# Patient Record
Sex: Female | Born: 1935 | Race: White | Hispanic: No | Marital: Married | Smoking: Never smoker
Health system: Southern US, Community
[De-identification: ages and names within clinical notes are randomized; demographics above are authoritative.]

## PROBLEM LIST (undated history)

## (undated) DIAGNOSIS — E785 Hyperlipidemia, unspecified: Secondary | ICD-10-CM

## (undated) DIAGNOSIS — M419 Scoliosis, unspecified: Secondary | ICD-10-CM

## (undated) DIAGNOSIS — M1990 Unspecified osteoarthritis, unspecified site: Secondary | ICD-10-CM

## (undated) DIAGNOSIS — IMO0001 Reserved for inherently not codable concepts without codable children: Secondary | ICD-10-CM

## (undated) DIAGNOSIS — I10 Essential (primary) hypertension: Secondary | ICD-10-CM

## (undated) DIAGNOSIS — T7840XA Allergy, unspecified, initial encounter: Secondary | ICD-10-CM

## (undated) DIAGNOSIS — Z5189 Encounter for other specified aftercare: Secondary | ICD-10-CM

## (undated) HISTORY — DX: Allergy, unspecified, initial encounter: T78.40XA

## (undated) HISTORY — PX: ABDOMINAL HYSTERECTOMY: SHX81

## (undated) HISTORY — DX: Essential (primary) hypertension: I10

## (undated) HISTORY — PX: BREAST BIOPSY: SHX20

## (undated) HISTORY — DX: Encounter for other specified aftercare: Z51.89

## (undated) HISTORY — DX: Reserved for inherently not codable concepts without codable children: IMO0001

## (undated) HISTORY — PX: APPENDECTOMY: SHX54

## (undated) HISTORY — DX: Unspecified osteoarthritis, unspecified site: M19.90

## (undated) HISTORY — DX: Hyperlipidemia, unspecified: E78.5

## (undated) HISTORY — PX: JOINT REPLACEMENT: SHX530

---

## 2010-05-13 ENCOUNTER — Ambulatory Visit (INDEPENDENT_AMBULATORY_CARE_PROVIDER_SITE_OTHER): Payer: MEDICARE | Admitting: Family Medicine

## 2010-05-13 ENCOUNTER — Encounter: Payer: Self-pay | Admitting: Family Medicine

## 2010-05-13 VITALS — BP 179/88 | HR 86 | Temp 98.9°F | Ht 65.0 in | Wt 158.0 lb

## 2010-05-13 DIAGNOSIS — M949 Disorder of cartilage, unspecified: Secondary | ICD-10-CM

## 2010-05-13 DIAGNOSIS — M8580 Other specified disorders of bone density and structure, unspecified site: Secondary | ICD-10-CM

## 2010-05-13 DIAGNOSIS — E7800 Pure hypercholesterolemia, unspecified: Secondary | ICD-10-CM

## 2010-05-13 DIAGNOSIS — I10 Essential (primary) hypertension: Secondary | ICD-10-CM

## 2010-05-13 DIAGNOSIS — M810 Age-related osteoporosis without current pathological fracture: Secondary | ICD-10-CM | POA: Insufficient documentation

## 2010-05-13 DIAGNOSIS — G2581 Restless legs syndrome: Secondary | ICD-10-CM | POA: Insufficient documentation

## 2010-05-13 DIAGNOSIS — J309 Allergic rhinitis, unspecified: Secondary | ICD-10-CM | POA: Insufficient documentation

## 2010-05-13 NOTE — Progress Notes (Signed)
  Subjective:    Patient ID: Gina Bishop, female    DOB: 10/06/1931, 77 y.o.   MRN: [id number]  HPI Healthy, switching doctors Has labile hypertension HPDP Colonoscopy 2006 normal Mammogram 3/12 normal Full shot 9/11 Pneumovax after age 69 Has had zostavax 2008 Tetanus 2004 Has been told osteopenia.  Last bone density ~5 years ago. Las labs including lipid panel were 06/18/09.   Review of Systems No active complaints.  Denies CP syncope, SOB, unusual wt change or bleeding.    Objective:   Physical Exam HEENT nl Neck supple without thyrometally Lungs clear Cardiac RRR without m or g Abd benign Ext no edema       Assessment & Plan:

## 2010-05-15 MED ORDER — CHLORTHALIDONE 25 MG PO TABS: 25.0000 mg | ORAL_TABLET | ORAL | Status: DC

## 2010-05-15 NOTE — Assessment & Plan Note (Signed)
Seems to be isolated systolic hypertension.  Increase chlortalidone to every other day.

## 2010-05-15 NOTE — Assessment & Plan Note (Signed)
Due for bone density.

## 2010-06-24 ENCOUNTER — Encounter: Payer: Self-pay | Admitting: Family Medicine

## 2010-06-24 ENCOUNTER — Ambulatory Visit (HOSPITAL_COMMUNITY)
Admission: RE | Admit: 2010-06-24 | Discharge: 2010-06-24 | Disposition: A | Discharge status: Home / Self Care | Payer: MEDICARE | Source: Ambulatory Visit | Attending: Family Medicine | Admitting: Family Medicine

## 2010-06-24 ENCOUNTER — Ambulatory Visit (INDEPENDENT_AMBULATORY_CARE_PROVIDER_SITE_OTHER): Payer: MEDICARE | Admitting: Family Medicine

## 2010-06-24 VITALS — BP 150/83 | HR 96 | Temp 98.2°F | Ht 65.0 in | Wt 155.0 lb

## 2010-06-24 DIAGNOSIS — Z780 Asymptomatic menopausal state: Secondary | ICD-10-CM | POA: Insufficient documentation

## 2010-06-24 DIAGNOSIS — I10 Essential (primary) hypertension: Secondary | ICD-10-CM

## 2010-06-24 DIAGNOSIS — M8580 Other specified disorders of bone density and structure, unspecified site: Secondary | ICD-10-CM

## 2010-06-24 DIAGNOSIS — Z13820 Encounter for screening for osteoporosis: Secondary | ICD-10-CM | POA: Insufficient documentation

## 2010-06-24 MED ORDER — ERGOCALCIFEROL 1.25 MG (50000 UT) PO CAPS: ORAL_CAPSULE | ORAL | Status: DC

## 2010-06-24 NOTE — Patient Instructions (Signed)
Continue to take BP regularly.  Call me in one month with results.  I am looking for most readings to be 140/80 or less.   I will call with results of blood.   The Vit D refill is at the pharmacy.

## 2010-06-24 NOTE — Progress Notes (Signed)
  Subjective:    Patient ID: Gina Bishop, female    DOB: 15-Nov-1931, 77 y.o.   MRN: [id number]  HPI Up to date on health maintenance.  BP labile.  Still high too often.  Could not tolerate higher dose of chorthalidone.   This is more of a HBP follow up as compared to annual physical.    Review of Systems     Objective:   Physical Exam Lungs clear Cardiac RRR without Murmur or gallop No edema        Assessment & Plan:

## 2010-06-25 ENCOUNTER — Encounter: Payer: Self-pay | Admitting: Family Medicine

## 2010-06-25 LAB — COMPLETE METABOLIC PANEL WITH GFR
ALT: 15 U/L (ref 0–35)
Albumin: 4.4 g/dL (ref 3.5–5.2)
CO2: 25 mEq/L (ref 19–32)
GFR, Est African American: >60 mL/min (ref >60)
Potassium: 4.4 mEq/L (ref 3.5–5.3)
Sodium: 130 mEq/L — ABNORMAL LOW (ref 135–145)
Total Bilirubin: 0.4 mg/dL (ref 0.3–1.2)
Total Protein: 7 g/dL (ref 6.0–8.3)

## 2010-06-26 NOTE — Assessment & Plan Note (Signed)
Check BMP.  May need added antihypertensive.  Difficult to tell from home record with increasing and decreasing diuretic.  Will monitor home BP x 1 month then decide.

## 2010-07-02 ENCOUNTER — Telehealth: Payer: Self-pay | Admitting: Family Medicine

## 2010-07-02 NOTE — Telephone Encounter (Signed)
Is returning call to Dr Leveda Anna - please call about results

## 2010-07-03 ENCOUNTER — Encounter: Payer: Self-pay | Admitting: Family Medicine

## 2010-07-03 NOTE — Telephone Encounter (Signed)
Mr. Baskins called again regarding phone call made earlier.  Please call him back at [phone]

## 2010-07-06 NOTE — Telephone Encounter (Signed)
This encounter was created in error - please disregard.

## 2010-07-06 NOTE — Telephone Encounter (Signed)
Dr. Leveda Anna, If you tell me what this patient needs to know I will be more than happy to call her. ---[place] Dec

## 2010-07-07 NOTE — Telephone Encounter (Signed)
Bone density results noted.  Need to compare to old records to see if it has worsened.

## 2010-07-08 ENCOUNTER — Ambulatory Visit (INDEPENDENT_AMBULATORY_CARE_PROVIDER_SITE_OTHER): Payer: 59 | Admitting: Family Medicine

## 2010-07-08 ENCOUNTER — Encounter: Payer: Self-pay | Admitting: Family Medicine

## 2010-07-08 VITALS — BP 164/82 | Temp 98.1°F | Ht 65.0 in | Wt 155.0 lb

## 2010-07-08 DIAGNOSIS — B373 Candidiasis of vulva and vagina: Secondary | ICD-10-CM | POA: Insufficient documentation

## 2010-07-08 DIAGNOSIS — R300 Dysuria: Secondary | ICD-10-CM | POA: Insufficient documentation

## 2010-07-08 DIAGNOSIS — I10 Essential (primary) hypertension: Secondary | ICD-10-CM

## 2010-07-08 LAB — POCT URINALYSIS DIPSTICK
Bilirubin, UA: NEGATIVE
Blood, UA: NEGATIVE
Glucose, UA: NEGATIVE
Spec Grav, UA: 1.01
Urobilinogen, UA: 0.2

## 2010-07-08 LAB — POCT WET PREP (WET MOUNT)

## 2010-07-08 MED ORDER — FLUCONAZOLE 150 MG PO TABS: 150.0000 mg | ORAL_TABLET | Freq: Once | ORAL | Status: AC

## 2010-07-08 NOTE — Assessment & Plan Note (Signed)
Dysuria more suggestive of yeast than cystitis, no evidence of uti on labwork.  WIll treat for candidal vaginitis.

## 2010-07-08 NOTE — Progress Notes (Signed)
  Subjective:    Patient ID: Gina Bishop, female    DOB: 10-May-1931, 77 y.o.   MRN: [id number]  HPI  DYSURIA  Onset: 3 days   Worsening:no    Symptoms Urgency: no  Frequency: yes  Hesitancy: no  Hematuria: no  Flank Pain: no  Fever: no   Highest Temp:   Nausea/Vomiting: no  Pregnant: no STD exposure/history: no Discharge: no Irritants: no Rash: no  Red Flags  : (Risk Factors for Complicated UTI) Recent Antibiotic Usage (last 30 days): yes  Symptoms lasting more than seven (7) days: no  More than 3 UTI's last 12 months: no  PMH of  1. DM: no 2. Renal Disease/Calculi: no 3. Urinary Tract Abnormality: no  4. Instrumentation/Trauma: no 5. Immunosuppression: no    Review of Systems See hPI    Objective:   Physical Exam  Constitutional: She appears well-developed and well-nourished.  Abdominal: Soft. Bowel sounds are normal. She exhibits no distension and no mass. There is no tenderness. There is no rebound and no guarding.  Genitourinary: Vagina normal.       Thick white vaginal cream? noted          Assessment & Plan:

## 2010-07-08 NOTE — Progress Notes (Signed)
Addended by: [place], Jazmyne Beauchesne on: 07/08/2010 10:20 AM   Modules accepted: Orders

## 2010-07-08 NOTE — Assessment & Plan Note (Signed)
History of vaginal burning, recent antibiotic use.  Will Rx fluconazole.

## 2010-07-08 NOTE — Assessment & Plan Note (Signed)
BP elevated today.  Patient states she thinks BP usually in 144-147/73 range.  Did not bring log today. States she will call Dr. Leveda Anna as previously arranged to discuss BP log and need for additional antihypertensive

## 2010-07-23 ENCOUNTER — Telehealth: Payer: Self-pay | Admitting: Family Medicine

## 2010-07-23 NOTE — Telephone Encounter (Signed)
Reports last 2 weeks of BP: 147/84 138/73 142/72 147/77 127/58 154/76 146/74 146/72 150/86 134/79 Feels like she has leveled out -

## 2010-07-24 NOTE — Telephone Encounter (Signed)
Called and LM.  BPs are a bit high, but not so high that I want to change meds.  Recheck in 2 months with another series of home BPs

## 2010-09-07 ENCOUNTER — Encounter: Payer: Self-pay | Admitting: Family Medicine

## 2010-09-07 ENCOUNTER — Ambulatory Visit (INDEPENDENT_AMBULATORY_CARE_PROVIDER_SITE_OTHER): Payer: 59 | Admitting: Family Medicine

## 2010-09-07 DIAGNOSIS — I10 Essential (primary) hypertension: Secondary | ICD-10-CM

## 2010-09-07 DIAGNOSIS — R21 Rash and other nonspecific skin eruption: Secondary | ICD-10-CM | POA: Insufficient documentation

## 2010-09-07 MED ORDER — FLUTICASONE PROPIONATE 50 MCG/ACT NA SUSP: 2.0000 | Freq: Every day | NASAL | Status: DC

## 2010-09-07 MED ORDER — PRAVASTATIN SODIUM 40 MG PO TABS: 40.0000 mg | ORAL_TABLET | Freq: Every day | ORAL | Status: DC

## 2010-09-07 NOTE — Assessment & Plan Note (Signed)
Unclear etiology.  Will stop current creams.  Consider bacterial source given 2 small pustules.  Will wash with mild soap and treat with bacitracin.  To follow up with Dr. Leveda Anna in 2 weeks.  Precautions reviewed.

## 2010-09-07 NOTE — Progress Notes (Signed)
  Subjective:    Patient ID: Carollee Nussbaumer, female    DOB: 09-01-1931, 77 y.o.   MRN: [id number]  HPI 77 yo pt of Dr. Cyndia Skeeters here for outbreak on face.  She reports it started about 5 weeks ago on the L side of her nose with a pimple, which she treated with an "acne" medicine that had hydrocortisone cream in it.  Then noted similar rash on R side of nose.  Potential factors include menthol liquid that she applies to arm and then might get on face while sleeping, also rubs nose a lot in summer.  Rash itches and stings.  Has tried the acne cream, and diphenhydramine gel with some relief, but it said to stop after 2 weeks.   Also noted similar spot under R eye.   Denies fever, chills, oral lesions, chest pain. BP runs 140s/70s at home.  Does not want to discuss blood pressure today.    Review of Systems  See HPI     Objective:   Physical Exam  Nursing note and vitals reviewed. Constitutional: She appears well-developed and well-nourished. No distress.  HENT:  Mouth/Throat: Oropharynx is clear and moist. No oropharyngeal exudate.  Eyes: Conjunctivae are normal. Right eye exhibits no discharge. Left eye exhibits no discharge. No scleral icterus.  Lymphadenopathy:    She has no cervical adenopathy.  Skin: She is not diaphoretic.       + erythema around nares B and under R eye, medial side.  Small pustule on R side of nose, under R eye.  No scale.  Clear borders.          Assessment & Plan:

## 2010-09-07 NOTE — Assessment & Plan Note (Signed)
Poor control today.  Pt without red flags.  She declines intervention today and plans to follow up with Dr. Leveda Bishop in 2 weeks.

## 2010-09-07 NOTE — Patient Instructions (Signed)
You can try washing your face with a mild soap, and then applying bacitracin to the affected areas.  If it gets worse, or you have any vision problems, then please come back and see [place]. Your blood pressure was 190/92 today.  I think it is a good idea to keep checking it at home.

## 2010-09-28 ENCOUNTER — Telehealth: Payer: Self-pay | Admitting: Family Medicine

## 2010-09-28 DIAGNOSIS — I10 Essential (primary) hypertension: Secondary | ICD-10-CM

## 2010-09-28 MED ORDER — BENAZEPRIL HCL 40 MG PO TABS: 40.0000 mg | ORAL_TABLET | Freq: Every day | ORAL | Status: DC

## 2010-09-28 MED ORDER — CHLORTHALIDONE 25 MG PO TABS: 12.5000 mg | ORAL_TABLET | ORAL | Status: DC

## 2010-09-28 NOTE — Assessment & Plan Note (Signed)
Home BPs are acceptable, but not ideal.  Discussed with patient.  Will not change current meds.

## 2010-09-28 NOTE — Telephone Encounter (Signed)
Mrs. Natt called in her bp numbers from last month thru today.  147/75 on 7/7 138/73 on 7/28 137/80 on 7/29 148/77 on 8/3 147/87 on 8/5 128/65 on 8/6 149/73 on 8/7 144/74 on 8/9 119/69 on 8/10 157/78 on 8/11 152/77 on 8/12 146/71 on 8/13   Mrs. Strauch will be out of town the rest of this week and if you need to call her regarding this you can reach her at number given for contact.  Also she is needing her refills on her Benazapril and her Chlortalidone called to [facility] at [place] on [address].  Would like to have before leaving tomorrow.

## 2010-12-11 ENCOUNTER — Encounter: Payer: Self-pay | Admitting: Family Medicine

## 2010-12-11 ENCOUNTER — Ambulatory Visit (INDEPENDENT_AMBULATORY_CARE_PROVIDER_SITE_OTHER): Payer: Medicare Other | Admitting: Family Medicine

## 2010-12-11 VITALS — BP 162/84 | HR 70 | Temp 98.1°F | Ht 65.0 in | Wt 158.3 lb

## 2010-12-11 DIAGNOSIS — M8580 Other specified disorders of bone density and structure, unspecified site: Secondary | ICD-10-CM

## 2010-12-11 DIAGNOSIS — E7800 Pure hypercholesterolemia, unspecified: Secondary | ICD-10-CM

## 2010-12-11 DIAGNOSIS — M949 Disorder of cartilage, unspecified: Secondary | ICD-10-CM

## 2010-12-11 DIAGNOSIS — R21 Rash and other nonspecific skin eruption: Secondary | ICD-10-CM

## 2010-12-11 DIAGNOSIS — Z23 Encounter for immunization: Secondary | ICD-10-CM

## 2010-12-11 DIAGNOSIS — I10 Essential (primary) hypertension: Secondary | ICD-10-CM

## 2010-12-11 LAB — COMPLETE METABOLIC PANEL WITH GFR
Albumin: 4.5 g/dL (ref 3.5–5.2)
CO2: 25 mEq/L (ref 19–32)
Calcium: 9.8 mg/dL (ref 8.4–10.5)
Chloride: 97 mEq/L (ref 96–112)
GFR, Est African American: >90 mL/min (ref >90)
GFR, Est Non African American: 80 mL/min — ABNORMAL LOW (ref >90)
Glucose, Bld: 95 mg/dL (ref 70–99)
Potassium: 4.1 mEq/L (ref 3.5–5.3)
Sodium: 133 mEq/L — ABNORMAL LOW (ref 135–145)
Total Bilirubin: 0.5 mg/dL (ref 0.3–1.2)
Total Protein: 6.9 g/dL (ref 6.0–8.3)

## 2010-12-11 LAB — LIPID PANEL: HDL: 68 mg/dL (ref >39)

## 2010-12-11 MED ORDER — PRAVASTATIN SODIUM 40 MG PO TABS: 40.0000 mg | ORAL_TABLET | Freq: Every day | ORAL | Status: DC

## 2010-12-11 MED ORDER — METRONIDAZOLE 0.75 % EX GEL: Freq: Two times a day (BID) | CUTANEOUS | Status: AC

## 2010-12-11 MED ORDER — METOPROLOL SUCCINATE ER 25 MG PO TB24: 25.0000 mg | ORAL_TABLET | Freq: Every day | ORAL | Status: DC

## 2010-12-11 MED ORDER — CHLORTHALIDONE 25 MG PO TABS: 12.5000 mg | ORAL_TABLET | ORAL | Status: DC

## 2010-12-11 MED ORDER — BENAZEPRIL HCL 40 MG PO TABS: 40.0000 mg | ORAL_TABLET | Freq: Every day | ORAL | Status: DC

## 2010-12-11 MED ORDER — CHLORTHALIDONE 25 MG PO TABS
12.5000 mg | ORAL_TABLET | ORAL | Status: DC
Start: 2010-12-11 — End: 2010-12-11

## 2010-12-11 NOTE — Assessment & Plan Note (Signed)
Stable, cont vit d and calcium

## 2010-12-11 NOTE — Progress Notes (Signed)
  Subjective:    Patient ID: Gina Bishop, female    DOB: Jun 29, 1931, 77 y.o.   MRN: [id number]  HPIHere for follow up of cholesterol and HBP.  Will get wellness visit later Home BPs are marginally high Now taking fish oil On vit d will need recheck CO rash in nasal folds thought due to flonase and has stopped.    Review of Systems  Denies cp, SOB, bleeding, wt change.     Objective:   Physical Exam  Heent Nl except rash in nasolabial folds Neck supple without adenopathy Lungs clear Cardiac RRR  Abd benign Ext trace edema Neuro, gait normal      Assessment & Plan:

## 2010-12-11 NOTE — Assessment & Plan Note (Signed)
Not at goal, add b blocker and check labs

## 2010-12-11 NOTE — Assessment & Plan Note (Signed)
Dx is not certain, will treat as if rosacea

## 2010-12-11 NOTE — Assessment & Plan Note (Signed)
Check labs 

## 2010-12-11 NOTE — Patient Instructions (Signed)
As we discussed Call me in 2-3 weeks if the rash is not getting better Make an appointment with [name] for your annual wellness exam I will call with blood work results

## 2010-12-12 LAB — VITAMIN D 25 HYDROXY (VIT D DEFICIENCY, FRACTURES): Vit D, 25-Hydroxy: 51 ng/mL (ref 30–89)

## 2010-12-14 ENCOUNTER — Other Ambulatory Visit: Payer: Self-pay | Admitting: Family Medicine

## 2010-12-14 ENCOUNTER — Encounter: Payer: Self-pay | Admitting: Family Medicine

## 2010-12-14 MED ORDER — ESTROGENS, CONJUGATED 0.625 MG/GM VA CREA: TOPICAL_CREAM | VAGINAL | Status: DC

## 2010-12-23 ENCOUNTER — Ambulatory Visit (INDEPENDENT_AMBULATORY_CARE_PROVIDER_SITE_OTHER): Payer: Medicare Other | Admitting: Home Health Services

## 2010-12-23 ENCOUNTER — Encounter: Payer: Self-pay | Admitting: Home Health Services

## 2010-12-23 VITALS — BP 151/75 | HR 65 | Temp 98.5°F | Ht 65.0 in | Wt 161.5 lb

## 2010-12-23 DIAGNOSIS — Z0000 Encounter for general adult medical examination without abnormal findings: Secondary | ICD-10-CM

## 2010-12-23 NOTE — Patient Instructions (Signed)
1. Continue to work out at [employer] 4-5 times a week. 2. Continue to eat a variety of foods including 3-5 fruits and vegetables a day. 3. Complete living will and bring copy to Dr. Leveda Anna.

## 2010-12-23 NOTE — Progress Notes (Signed)
Patient here for annual wellness visit, patient reports: Risk Factors/Conditions needing evaluation or treatment: Pt does not have any risk factors that need evaluation. Home Safety: Pt reports living with husband in 2 story home.  Pt reports having smoke detectors and adaptive equipment in bathroom. Other Information: Corrective lens: Pt wears daily corrective lens and visits eye doctor annually. Dentures: Pt does not have dentures and visits dentist 3x a year. Memory: Pt denies memory problems. Patient's Mini Mental Score (recorded in doc. flowsheet): 29  Balance/Gait: Pt does not have any noticeable impairments. Balance Abnormal Patient value  Sitting balance    Sit to stand    Attempts to arise    Immediate standing balance    Standing balance    Nudge    Eyes closed- Romberg    Tandem stance    Back lean    Neck Rotation    360 degree turn    Sitting down     Gait Abnormal Patient value  Initiation of gait    Step length-left    Step length-right    Step height-left    Step height-right    Step symmetry    Step continuity    Path deviation    Trunk movement    Walking stance        Annual Wellness Visit Requirements Recorded Today In  Medical, family, social history Past Medical, Family, Social History Section  Current providers Care team  Current medications Medications  Wt, BP, Ht, BMI Vital signs  Hearing assessment (welcome visit) Hearing/vision  Tobacco, alcohol, illicit drug use History  ADL Nurse Assessment  Depression Screening Nurse Assessment  Cognitive impairment Nurse Assessment  Mini Mental Status Document Flowsheet  Fall Risk Nurse Assessment  Home Safety Progress Note  End of Life Planning (welcome visit) Social Documentation  Medicare preventative services Progress Note  Risk factors/conditions needing evaluation/treatment Progress Note  Personalized health advice Patient Instructions, goals, letter  Diet & Exercise Social Documentation    Emergency Contact Social Documentation  Seat Belts Social Documentation  Sun exposure/protection Social Documentation    Prevention Plan:   Recommended Medicare Prevention Screenings Women over 65 Test For Frequency Date of Last- BOLD if needed  Breast Cancer 1-2 yrs 3/12  Cervical Cancer 1-3 yrs Not indicated  Colorectal Cancer 1-10 yrs 2004  Osteoporosis once Pt reported done  Cholesterol 5 yrs 10/12  Diabetes yearly 10/12  HIV yearly declined  Influenza Shot yearly 10/12  Pneumonia Shot once 10/00  Zostavax Shot once 1/08

## 2010-12-24 ENCOUNTER — Encounter: Payer: Self-pay | Admitting: Home Health Services

## 2010-12-24 NOTE — Progress Notes (Signed)
Subjective:    Patient ID: Gina Bishop, female    DOB: [birth year], 77 y.o.   MRN: [id number]  HPI   Gina, Bishop - 12/23/10 More Detail >>      [name], MPH        Sent: Wed December 23, 2010 12:04 PM    To: Sanjuana Letters          Message     Provider Instructions:  Write addendum:  I have reviewed this visit and discussed with [name] and agree with her documentation.  Click reviewed Medications, Problem List, Past (Medical, Surgical) Family, Social HistoriesCharge   LOS-no charge  Capture charge: [id number]  Close Encounter  Thanks, [name]   12/23/2010 11:00 AM Office Visit  MRN: [id number]   Description: 77 year old female  Provider: [name], MPH  Department: [facility]        Diagnoses     Medicare annual wellness visit, initial   - Primary    V70.0      Reason for Visit     Annual Exam    Medicare Initial Annual Wellness Visit        Vitals - Last Recorded       BP Pulse Temp(Src) Ht Wt BMI    151/75  65  98.5 F (36.9 C) (Oral)  5\' 5"  (1.651 m)  161 lb 8 oz (73.256 kg)  26.88 kg/m2       Vitals History Recorded       Progress Notes     [name], MPH  12/23/2010 12:01 PM  Pended Patient here for annual wellness visit, patient reports: Risk Factors/Conditions needing evaluation or treatment: Pt does not have any risk factors that need evaluation. Home Safety: Pt reports living with husband in 2 story home.  Pt reports having smoke detectors and adaptive equipment in bathroom. Other Information: Corrective lens: Pt wears daily corrective lens and visits eye doctor annually. Dentures: Pt does not have dentures and visits dentist 3x a year. Memory: Pt denies memory problems. Patient's Mini Mental Score (recorded in doc. flowsheet): 29   Balance/Gait: Pt does not have any noticeable impairments. Balance  Abnormal  Patient value   Sitting  balance       Sit to stand       Attempts to arise       Immediate standing balance       Standing balance       Nudge       Eyes closed- Romberg       Tandem stance       Back lean       Neck Rotation       360 degree turn       Sitting down           Gait  Abnormal  Patient value   Initiation of gait       Step length-left       Step length-right       Step height-left       Step height-right       Step symmetry       Step continuity       Path deviation       Trunk movement       Walking stance                 Annual Wellness Visit Requirements  Recorded Today In   Medical, family, social history  Past Medical, Family, Social History Section   Current providers  Care team   Current medications  Medications   Wt, BP, Ht, BMI  Vital signs   Hearing assessment (welcome visit)  Hearing/vision   Tobacco, alcohol, illicit drug use  History   ADL  Nurse Assessment   Depression Screening  Nurse Assessment   Cognitive impairment  Nurse Assessment   Mini Mental Status  Document Flowsheet   Fall Risk  Nurse Assessment   Home Safety  Progress Note   End of Life Planning (welcome visit)  Social Documentation   Medicare preventative services  Progress Note   Risk factors/conditions needing evaluation/treatment  Progress Note   Personalized health advice  Patient Instructions, goals, letter   Diet & Exercise  Social Documentation   Emergency Contact  Social Documentation   Seat Belts  Social Documentation   Sun exposure/protection  Social Documentation        Prevention Plan:     Recommended Medicare Prevention Screenings Women over 65 Test For  Frequency  Date of Last-  BOLD if needed   Breast Cancer  1-2 yrs  3/12   Cervical Cancer  1-3 yrs  Not indicated   Colorectal Cancer  1-10 yrs  2004   Osteoporosis  once  Pt reported done   Cholesterol  5 yrs  10/12   Diabetes  yearly  10/12   HIV  yearly  declined   Influenza Shot  yearly  10/12   Pneumonia  Shot  once  10/00   Zostavax Shot  once  1/08             Electronic signature on 12/23/2010        Not recorded             Patient Instructions     1. Continue to work out at [employer] 4-5 times a week. 2. Continue to eat a variety of foods including 3-5 fruits and vegetables a day. 3. Complete living will and bring copy to Dr. Leveda Anna.          Follow-up and Disposition     Routing History Recorded        All Flowsheet Templates (all recorded)     Mini Mental Exam Flowsheet    Encounter Vitals Flowsheet    Custom Formula Data Flowsheet    Anthropometrics Flowsheet    Extended Vitals Flowsheet    Amb Nursing Assessment Flowsheet                Hearing Screening  Edited by: [name], MPH        125hz  250hz  500hz  1000hz  2000hz  4000hz  8000hz     Right ear     40 40 Fail Fail      Left ear     40 40 Fail Fail      Method: Otoacoustic emissions        Letters     Letter Information         Status    Oneal Grout on 12/23/2010 Sent           Referring Provider     Richrd Prime Hensel        Routing History       Recipient Method User Date Routed To    [facility] [name], [place] [[id number]] 12/23/2010 (none on file)    [address] [place] [address]  Phone: [phone] Letter: created on 12/23/2010 by [name], MPH              Other Encounter Related Information     Allergies & Medications         Problem List         History         Patient-Entered Questionnaires     No data filed            Review of Systems     Objective:   Physical Exam        Assessment & Plan:

## 2011-06-18 ENCOUNTER — Other Ambulatory Visit: Payer: Self-pay | Admitting: Family Medicine

## 2011-06-18 DIAGNOSIS — G2581 Restless legs syndrome: Secondary | ICD-10-CM

## 2011-06-18 MED ORDER — PRAMIPEXOLE DIHYDROCHLORIDE 0.5 MG PO TABS: 0.5000 mg | ORAL_TABLET | Freq: Every evening | ORAL | Status: DC

## 2011-06-18 NOTE — Assessment & Plan Note (Signed)
Med refill per fax request

## 2011-09-27 ENCOUNTER — Other Ambulatory Visit: Payer: Self-pay | Admitting: Family Medicine

## 2011-09-28 ENCOUNTER — Telehealth: Payer: Self-pay | Admitting: Family Medicine

## 2011-09-28 NOTE — Telephone Encounter (Signed)
Spoke with patient and she is coming in Friday 8/16 to see dr. Leveda Anna

## 2011-09-28 NOTE — Telephone Encounter (Signed)
Patient is calling to speak to Dr. Cyndia Skeeters nurse about the stiffness she has in her body.  Ibuprofen helps but she doesn't want to have to take it continuously.  Also, she isn't sleeping well at all.

## 2011-10-01 ENCOUNTER — Ambulatory Visit (INDEPENDENT_AMBULATORY_CARE_PROVIDER_SITE_OTHER): Payer: Medicare Other | Admitting: Family Medicine

## 2011-10-01 ENCOUNTER — Encounter: Payer: Self-pay | Admitting: Family Medicine

## 2011-10-01 VITALS — BP 176/74 | HR 102 | Ht 65.0 in | Wt 161.0 lb

## 2011-10-01 DIAGNOSIS — I10 Essential (primary) hypertension: Secondary | ICD-10-CM

## 2011-10-01 DIAGNOSIS — M791 Myalgia, unspecified site: Secondary | ICD-10-CM

## 2011-10-01 DIAGNOSIS — IMO0001 Reserved for inherently not codable concepts without codable children: Secondary | ICD-10-CM

## 2011-10-01 LAB — CBC
HCT: 36.5 % (ref 36.0–46.0)
MCH: 29.4 pg (ref 26.0–34.0)
MCV: 85.9 fL (ref 78.0–100.0)
Platelets: 274 10*3/uL (ref 150–400)
RDW: 13 % (ref 11.5–15.5)

## 2011-10-01 LAB — COMPLETE METABOLIC PANEL WITH GFR
ALT: 15 U/L (ref 0–35)
AST: 21 U/L (ref 0–37)
Albumin: 4.3 g/dL (ref 3.5–5.2)
Alkaline Phosphatase: 87 U/L (ref 39–117)
Calcium: 9.3 mg/dL (ref 8.4–10.5)
Chloride: 98 mEq/L (ref 96–112)
Potassium: 4.6 mEq/L (ref 3.5–5.3)
Sodium: 134 mEq/L — ABNORMAL LOW (ref 135–145)

## 2011-10-01 LAB — CK: Total CK: 59 U/L (ref 7–177)

## 2011-10-01 NOTE — Assessment & Plan Note (Addendum)
Poorly controled.  Will need treatment, but I will wait until labs return so that I don't confound her clinical picture with a new med.  Given results.  Told to see me in 2-3 weeks.  Keep BP log and bring in with visit.

## 2011-10-01 NOTE — Assessment & Plan Note (Signed)
I believe the new onset in a person who rarely complains is worth screening lab workup

## 2011-10-01 NOTE — Patient Instructions (Addendum)
I will call Monday with results. Please keep an eye on your symptoms.  If it is getting worse or new symptoms develop, let me know.   When you are feeling better, I want to increase your blood pressure meds.

## 2011-10-01 NOTE — Progress Notes (Signed)
  Subjective:    Patient ID: Gina Bishop, female    DOB: 1931/04/02, 78 y.o.   MRN: [id number]  HPI  Gina Bishop has two weeks of diffuse pain which she feels is more in the muscles than in the joints.  This seems like a new problem with abrupt onset (as opposed to the gradual worsening of an osteoarthritis.)  No new diet, drugs or activities.  No fever.  No infectious contacts.       Review of Systems     Objective:   Physical ExamNo skin rashes or joint deformaties. Thyroid normal Diffuse muscle tenderness No edema Lungs clear Cardiac RRR without m or g  Abd benign.        Assessment & Plan:

## 2011-10-04 ENCOUNTER — Encounter: Payer: Self-pay | Admitting: Family Medicine

## 2011-10-20 ENCOUNTER — Ambulatory Visit (INDEPENDENT_AMBULATORY_CARE_PROVIDER_SITE_OTHER): Payer: Medicare Other | Admitting: Family Medicine

## 2011-10-20 ENCOUNTER — Ambulatory Visit
Admission: RE | Admit: 2011-10-20 | Discharge: 2011-10-20 | Disposition: A | Discharge status: Home / Self Care | Payer: Medicare Other | Source: Ambulatory Visit | Attending: Family Medicine | Admitting: Family Medicine

## 2011-10-20 ENCOUNTER — Encounter: Payer: Self-pay | Admitting: Family Medicine

## 2011-10-20 VITALS — BP 176/86 | Temp 99.3°F | Ht 65.0 in | Wt 162.0 lb

## 2011-10-20 DIAGNOSIS — M47812 Spondylosis without myelopathy or radiculopathy, cervical region: Secondary | ICD-10-CM | POA: Insufficient documentation

## 2011-10-20 DIAGNOSIS — I10 Essential (primary) hypertension: Secondary | ICD-10-CM

## 2011-10-20 DIAGNOSIS — M542 Cervicalgia: Secondary | ICD-10-CM

## 2011-10-20 DIAGNOSIS — M25519 Pain in unspecified shoulder: Secondary | ICD-10-CM

## 2011-10-20 MED ORDER — CYCLOBENZAPRINE HCL 10 MG PO TABS: 10.0000 mg | ORAL_TABLET | Freq: Every day | ORAL | Status: AC

## 2011-10-20 MED ORDER — AMLODIPINE BESYLATE 5 MG PO TABS: 5.0000 mg | ORAL_TABLET | Freq: Every day | ORAL | Status: DC

## 2011-10-20 NOTE — Patient Instructions (Addendum)
Try being off pravachol for 2 weeks then restarting. I will call with X ray results I sent a muscle relaxer and a new blood pressure pill to your pharmacy.

## 2011-10-21 MED ORDER — MELOXICAM 7.5 MG PO TABS: 7.5000 mg | ORAL_TABLET | Freq: Every day | ORAL | Status: DC

## 2011-10-21 NOTE — Assessment & Plan Note (Signed)
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  May have a mild element of rotator cuff syndrome.

## 2011-10-21 NOTE — Assessment & Plan Note (Signed)
Likely her c spine disease explains all her symptoms.

## 2011-10-21 NOTE — Assessment & Plan Note (Signed)
Poor control, add amlodipine.

## 2011-10-21 NOTE — Progress Notes (Signed)
  Subjective:    Patient ID: Gina Bishop, female    DOB: 13-Oct-1931, 78 y.o.   MRN: [id number]  HPI Patient with two issues: HBP.  Still poor control Bilateral neck, shoulder and arm pain.  States that it is abrupt onset about 3 weeks ago.  Seems worse in shoulders.      Review of Systems     Objective:   Physical ExamNote elevated BP Neck shows limited ROM Both shoulders show crepitus with abduction.        Assessment & Plan:

## 2011-11-17 ENCOUNTER — Telehealth: Payer: Self-pay | Admitting: Family Medicine

## 2011-11-17 NOTE — Telephone Encounter (Signed)
Mr. Gina Bishop called regarding Mrs. Gina Bishop.  Need to speak with you asap.  Will be awaiting your call.  Would like to here from you today.

## 2011-11-17 NOTE — Telephone Encounter (Signed)
To make a long story short, meds for neck not helping much.  Had MRI of neck at [facility].  Now they want to do brain MRI.  No red flag symptoms to no emergency.  They have requesting disc of MRI and will come to me for second opinion.  I will likely have radiologist review MRI.  Also, may need gabapentin since Mobic not working.

## 2011-11-22 ENCOUNTER — Ambulatory Visit (INDEPENDENT_AMBULATORY_CARE_PROVIDER_SITE_OTHER): Payer: Medicare Other | Admitting: Family Medicine

## 2011-11-22 ENCOUNTER — Encounter: Payer: Self-pay | Admitting: Family Medicine

## 2011-11-22 VITALS — BP 161/71 | HR 98 | Temp 98.7°F | Wt 163.4 lb

## 2011-11-22 DIAGNOSIS — M47812 Spondylosis without myelopathy or radiculopathy, cervical region: Secondary | ICD-10-CM

## 2011-11-22 DIAGNOSIS — E7800 Pure hypercholesterolemia, unspecified: Secondary | ICD-10-CM

## 2011-11-22 DIAGNOSIS — I10 Essential (primary) hypertension: Secondary | ICD-10-CM

## 2011-11-22 NOTE — Patient Instructions (Signed)
Reasons for neck surgery: 1. Any pressure on the spinal cord requires immediate neck surg.  You don't show any signs of pressure on the cord, but the space is narrowed.  You'll need to have this followed. 2. If we cannot treat the pain, you would need surgery to relieve the pain.  There are plenty of meds that you can try - let your neurologist friend give you a starting point. 3. Weakness is a sign that you are risking permanent nerve damage.  Since your arms are weak, I worry that you will need surgery sooner rather than later.  Talk this over with your neurologist frient.

## 2011-11-22 NOTE — Assessment & Plan Note (Signed)
Long list of home BPs now acceptable.  Up today but she is upset and in pain.  She has good control based on home BPs

## 2011-11-22 NOTE — Addendum Note (Signed)
Addended by: Tivis Ringer on: 11/22/2011 03:42 PM   Modules accepted: Orders

## 2011-11-22 NOTE — Progress Notes (Signed)
  Subjective:    Patient ID: Gina Bishop, female    DOB: 22-Jan-1932, 78 y.o.   MRN: [id number]  HPITwo issues.  Home BPs now much improved with meds.  No longer having hot flashes. Neck pain growing worse.  Had MRI done at [facility].  They bring results and disc.   Results show: 1. Multi level neural foramenal disease due to facet arthropathy and degenerative disc disease. 2. Loss of spinal canal space due to post disc bulge and osteophytes.  Spinal cord signal normal No leg symptoms, does have arm weakness.    Review of Systems     Objective:   Physical Exam  Difficulty lifting arms past 90 degrees.  Partitially due to pain and likely some due to weakness        Assessment & Plan:

## 2011-12-03 ENCOUNTER — Other Ambulatory Visit: Payer: Self-pay | Admitting: Family Medicine

## 2011-12-13 ENCOUNTER — Other Ambulatory Visit: Payer: Self-pay | Admitting: Family Medicine

## 2011-12-17 ENCOUNTER — Other Ambulatory Visit: Payer: Medicare Other

## 2011-12-20 ENCOUNTER — Other Ambulatory Visit: Payer: Medicare Other

## 2011-12-20 DIAGNOSIS — E7800 Pure hypercholesterolemia, unspecified: Secondary | ICD-10-CM

## 2011-12-20 LAB — LIPID PANEL
LDL Cholesterol: 124 mg/dL — ABNORMAL HIGH (ref 0–99)
VLDL: 22 mg/dL (ref 0–40)

## 2011-12-20 NOTE — Progress Notes (Signed)
FLP DONE TODAY Gina Bishop 

## 2011-12-21 ENCOUNTER — Encounter: Payer: Self-pay | Admitting: Family Medicine

## 2011-12-22 ENCOUNTER — Encounter: Payer: Self-pay | Admitting: Family Medicine

## 2011-12-22 ENCOUNTER — Ambulatory Visit (INDEPENDENT_AMBULATORY_CARE_PROVIDER_SITE_OTHER): Payer: Medicare Other | Admitting: Family Medicine

## 2011-12-22 VITALS — BP 138/72 | HR 68 | Temp 98.8°F | Ht 65.0 in | Wt 162.9 lb

## 2011-12-22 DIAGNOSIS — I10 Essential (primary) hypertension: Secondary | ICD-10-CM

## 2011-12-22 DIAGNOSIS — Z23 Encounter for immunization: Secondary | ICD-10-CM

## 2011-12-22 DIAGNOSIS — M47812 Spondylosis without myelopathy or radiculopathy, cervical region: Secondary | ICD-10-CM

## 2011-12-22 MED ORDER — CHLORTHALIDONE 25 MG PO TABS
12.5000 mg | ORAL_TABLET | ORAL | Status: DC
Start: 2011-12-22 — End: 2013-01-29

## 2011-12-22 MED ORDER — TRAMADOL HCL 50 MG PO TABS: 50.0000 mg | ORAL_TABLET | Freq: Three times a day (TID) | ORAL | Status: DC | PRN

## 2011-12-22 MED ORDER — GABAPENTIN 100 MG PO CAPS: 100.0000 mg | ORAL_CAPSULE | Freq: Three times a day (TID) | ORAL | Status: DC

## 2011-12-22 MED ORDER — BENAZEPRIL HCL 40 MG PO TABS: 40.0000 mg | ORAL_TABLET | Freq: Every day | ORAL | Status: DC

## 2011-12-22 MED ORDER — DICYCLOMINE HCL 10 MG PO CAPS: 10.0000 mg | ORAL_CAPSULE | Freq: Three times a day (TID) | ORAL | Status: DC

## 2011-12-22 NOTE — Patient Instructions (Addendum)
I refilled your meds requested. Remember the exercises to prevent frozen shoulder. Stop the meloxicam Start tramadol first - it replaces the meloxicam as a regular pain med After you have been on the tramadol for 1 week, start the gabapentin, which is a nerve pain med. You are on a low dose of the gabapentin - so I can increase it if it is not doing the job.

## 2011-12-27 ENCOUNTER — Encounter: Payer: Self-pay | Admitting: Home Health Services

## 2011-12-28 NOTE — Assessment & Plan Note (Signed)
Given age, I would prefer tramadol and neuropathic pain med over meloxicam/NSAID

## 2011-12-28 NOTE — Progress Notes (Signed)
  Subjective:    Patient ID: Gina Bishop, female    DOB: [birth year], 78 y.o.   MRN: [id number]  HPI Patient complains of neck and bilateral shoulder pain Lt>Rt.  Known significant cervical radiculopathy.  Did get neuro consult and epidural steroid injections.  Unfortunately only got a couple of weeks of relief.  Complains of shooting pain and intermitant numbness.  No leg, bowel or bladder symptoms.    Review of Systems     Objective:   Physical Exam Pulses, reflexes, motor and sensory intact to both upper extremities       Assessment & Plan:

## 2012-01-19 ENCOUNTER — Other Ambulatory Visit: Payer: Self-pay | Admitting: Family Medicine

## 2012-01-19 DIAGNOSIS — M47812 Spondylosis without myelopathy or radiculopathy, cervical region: Secondary | ICD-10-CM

## 2012-01-19 NOTE — Telephone Encounter (Signed)
Forwarded to pcp.Gina Bishop Lynetta  

## 2012-01-19 NOTE — Telephone Encounter (Signed)
Patient is calling for a refill on Gabapentin.  The current dose of 100mg  3xday is not working, so she would like to increase the dose.  She uses [facility].

## 2012-01-20 MED ORDER — GABAPENTIN 300 MG PO CAPS: 300.0000 mg | ORAL_CAPSULE | Freq: Three times a day (TID) | ORAL | Status: DC

## 2012-01-20 NOTE — Telephone Encounter (Signed)
Called and left message.

## 2012-01-20 NOTE — Assessment & Plan Note (Signed)
Will increase dose of gabapentin

## 2012-01-25 ENCOUNTER — Ambulatory Visit (INDEPENDENT_AMBULATORY_CARE_PROVIDER_SITE_OTHER): Payer: Medicare Other | Admitting: Family Medicine

## 2012-01-25 ENCOUNTER — Encounter: Payer: Self-pay | Admitting: Family Medicine

## 2012-01-25 VITALS — BP 160/68 | HR 64 | Temp 98.5°F | Ht 65.0 in | Wt 164.5 lb

## 2012-01-25 DIAGNOSIS — L03114 Cellulitis of left upper limb: Secondary | ICD-10-CM

## 2012-01-25 DIAGNOSIS — L03119 Cellulitis of unspecified part of limb: Secondary | ICD-10-CM

## 2012-01-25 MED ORDER — DOXYCYCLINE HYCLATE 100 MG PO TABS: 100.0000 mg | ORAL_TABLET | Freq: Two times a day (BID) | ORAL | Status: DC

## 2012-01-25 NOTE — Patient Instructions (Addendum)
Take the Doxycycline twice daily for the next 7 days.  Come back in the next 2 days so we can take a look at your hand and make sure it is getting okay.

## 2012-01-26 DIAGNOSIS — L03114 Cellulitis of left upper limb: Secondary | ICD-10-CM | POA: Insufficient documentation

## 2012-01-26 NOTE — Assessment & Plan Note (Signed)
Likely entry point is abrasion between pointer and middle finger on dorsal aspect of hand.  No areas of fluctuance, no signs of abscess.  No systemic symptoms.  Plan to treat with Doxycycline bid x 7 days. Could likely get by with Keflex but want to cover for MRSA.   No history of gout, no real pain or tenderness, making this much less likely. FU on Friday to ensure continued improvement.  Return sooner if any worsening.

## 2012-01-26 NOTE — Progress Notes (Signed)
  Subjective:    Patient ID: Gina Bishop, female    DOB: [birth year], 78 y.o.   MRN: [id number]  HPI  1.  Left hand swelling:  Present x 3 days, started on Saturday.  She noted some increased "tightness" in her hand at that time, but noticeable edema did not start until Sunday.  Some redness dorsal knuckles of pointer and middle fingers, redness has spread across other joints since then.  Denies any pain unless she squeezes deeply in webbing between thumb and first finger, otherwise no pain even with movement of hands/fingers.  Has noted rubor present since Sunday. No history of arthralgias or gout.  No fevers or chills.  Eating and drinking well without N/V.   Review of Systems See HPI above for review of systems.       Objective:   Physical Exam  Gen:  Alert, cooperative patient who appears stated age in no acute distress.  Vital signs reviewed. Ext:   - Right hand:  WNL  - Left hand with moderate edema throughout, extending to wrist.  Full active and passive range of motion of wrist plantar/dorsal and abduction/adduction.  No TTP except deep palpation of superior aspect of thenar eminence (webbing between 1st and 2nd digit).  Radial/ulnar pulse +2.  Sensation +2 and symmetric in all 5 digits as well as rest of hand.   Skin:  Erythema noted across all four MCP joints dorsal aspect of hand, not thumb.  I note an abrasion located between 2nd and 3rd MCP joints dorsal aspect of hand.  There is also a healing scratch on dorsal aspect of hand between thumb and pointer finger.        Assessment & Plan:

## 2012-01-27 ENCOUNTER — Encounter: Payer: Self-pay | Admitting: Family Medicine

## 2012-01-27 ENCOUNTER — Ambulatory Visit (INDEPENDENT_AMBULATORY_CARE_PROVIDER_SITE_OTHER): Payer: Medicare Other | Admitting: Family Medicine

## 2012-01-27 VITALS — BP 168/65 | HR 68 | Temp 97.6°F | Ht 65.6 in | Wt 167.5 lb

## 2012-01-27 DIAGNOSIS — L03119 Cellulitis of unspecified part of limb: Secondary | ICD-10-CM

## 2012-01-27 DIAGNOSIS — L03114 Cellulitis of left upper limb: Secondary | ICD-10-CM

## 2012-01-27 DIAGNOSIS — L02519 Cutaneous abscess of unspecified hand: Secondary | ICD-10-CM

## 2012-01-27 NOTE — Progress Notes (Signed)
  Subjective:    Patient ID: Gina Bishop, female    DOB: 06-12-1931, 78 y.o.   MRN: [id number]  HPI  1.  FU cellulitis of Left hand:  Much improved since initiation of antibiotics.  Heat has resolved, swelling greatly resolved.  Not having any pain.  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  No fevers or chills, eating and drinking well.    Review of Systems See HPI above for review of systems.      Objective:   Physical Exam Gen:  Alert, cooperative patient who appears stated age in no acute distress.  Vital signs reviewed. Ext:    - Left hand:  Much improved exam.  Minimal to no edema.  No erythema.  No warmth.  Full strength handgrip without pain.  No tenderness to palpation throughout.  Healing abrasion between 2nd and 3rd digit knuckles.         Assessment & Plan:

## 2012-01-27 NOTE — Assessment & Plan Note (Signed)
Resolving To finish course of doxycycline. Call or return if worsening, no improvement, or concerns.

## 2012-08-02 ENCOUNTER — Other Ambulatory Visit: Payer: Self-pay | Admitting: Family Medicine

## 2012-08-08 ENCOUNTER — Other Ambulatory Visit: Payer: Self-pay | Admitting: Family Medicine

## 2012-08-08 MED ORDER — PRAMIPEXOLE DIHYDROCHLORIDE 0.5 MG PO TABS: 0.5000 mg | ORAL_TABLET | Freq: Every evening | ORAL | Status: DC | PRN

## 2012-08-16 ENCOUNTER — Other Ambulatory Visit: Payer: Self-pay | Admitting: Family Medicine

## 2012-08-31 ENCOUNTER — Other Ambulatory Visit: Payer: Self-pay | Admitting: Family Medicine

## 2012-10-14 ENCOUNTER — Other Ambulatory Visit: Payer: Self-pay | Admitting: Family Medicine

## 2012-11-17 ENCOUNTER — Encounter: Payer: Self-pay | Admitting: Family Medicine

## 2012-11-17 ENCOUNTER — Ambulatory Visit (INDEPENDENT_AMBULATORY_CARE_PROVIDER_SITE_OTHER): Payer: Medicare Other | Admitting: Family Medicine

## 2012-11-17 VITALS — BP 160/75 | HR 62 | Temp 98.7°F | Ht 65.0 in | Wt 160.0 lb

## 2012-11-17 DIAGNOSIS — E559 Vitamin D deficiency, unspecified: Secondary | ICD-10-CM

## 2012-11-17 DIAGNOSIS — IMO0001 Reserved for inherently not codable concepts without codable children: Secondary | ICD-10-CM

## 2012-11-17 DIAGNOSIS — M791 Myalgia, unspecified site: Secondary | ICD-10-CM

## 2012-11-17 DIAGNOSIS — M8580 Other specified disorders of bone density and structure, unspecified site: Secondary | ICD-10-CM

## 2012-11-17 DIAGNOSIS — Z23 Encounter for immunization: Secondary | ICD-10-CM

## 2012-11-17 DIAGNOSIS — I10 Essential (primary) hypertension: Secondary | ICD-10-CM

## 2012-11-17 DIAGNOSIS — M899 Disorder of bone, unspecified: Secondary | ICD-10-CM

## 2012-11-17 DIAGNOSIS — M47812 Spondylosis without myelopathy or radiculopathy, cervical region: Secondary | ICD-10-CM

## 2012-11-17 LAB — BASIC METABOLIC PANEL
Calcium: 9.8 mg/dL (ref 8.4–10.5)
Glucose, Bld: 86 mg/dL (ref 70–99)
Sodium: 136 mEq/L (ref 135–145)

## 2012-11-17 MED ORDER — BENAZEPRIL HCL 40 MG PO TABS: 40.0000 mg | ORAL_TABLET | Freq: Every day | ORAL | Status: DC

## 2012-11-17 MED ORDER — METOPROLOL SUCCINATE ER 50 MG PO TB24: 50.0000 mg | ORAL_TABLET | Freq: Every day | ORAL | Status: DC

## 2012-11-17 MED ORDER — MELOXICAM 7.5 MG PO TABS: 7.5000 mg | ORAL_TABLET | Freq: Every day | ORAL | Status: DC

## 2012-11-17 NOTE — Assessment & Plan Note (Signed)
Has been taking vit d.  Last level 2 years ago.  Will recheck to avoid tox

## 2012-11-17 NOTE — Assessment & Plan Note (Signed)
Check bone density. 

## 2012-11-17 NOTE — Patient Instructions (Addendum)
Google "isolated systolic hypertension"  Which is what you have.  Our goal for you is no longer 140/90 or less Our goal for you is now 150/90 or less. I am going to increase your metoprolol to 50 mg daily.  You can use up your current supply by just taking two pills daily I would like to see you in 1-2 months - check your BP at home - to see if we're at goal. I'll call with blood work results

## 2012-11-17 NOTE — Assessment & Plan Note (Signed)
Does have leg cramps.  Unwilling to increase to high potency statin.

## 2012-11-17 NOTE — Addendum Note (Signed)
Addended by: Jimmy Footman K on: 11/17/2012 12:18 PM   Modules accepted: Orders

## 2012-11-17 NOTE — Progress Notes (Signed)
  Subjective:    Patient ID: Gina Bishop, female    DOB: Mar 10, 1931, 79 y.o.   MRN: [id number]  HPI   Multiple issues.  1. Needs flu, prevnar and tetanus.  Agrees to flu and prevnar today. 2. BP elevated.  She has systolic HBP.  Goal is <150/90 3. Known osteopenia, close to osteoporosis.  Will recheck bone density.   4. Knee pain.  Tramadol did not help.  Back on mobic.  Wants to continue.  No apparent side effects. 5. Wants refill on vit D.  May not need.  Last level 2 years ago well within normal range.     Review of Systems     Objective:   Physical Exam  BP confirmed Left knee, no effusion.  + crepitus with ROM       Assessment & Plan:

## 2012-11-17 NOTE — Assessment & Plan Note (Signed)
Increase metoprolol.  Goal<150 systolic

## 2012-11-18 LAB — VITAMIN D 25 HYDROXY (VIT D DEFICIENCY, FRACTURES): Vit D, 25-Hydroxy: 63 ng/mL (ref 30–89)

## 2012-11-24 ENCOUNTER — Ambulatory Visit (HOSPITAL_COMMUNITY)
Admission: RE | Admit: 2012-11-24 | Discharge: 2012-11-24 | Disposition: A | Discharge status: Home / Self Care | Payer: Medicare Other | Source: Ambulatory Visit | Attending: Family Medicine | Admitting: Family Medicine

## 2012-11-24 DIAGNOSIS — Z780 Asymptomatic menopausal state: Secondary | ICD-10-CM | POA: Insufficient documentation

## 2012-11-24 DIAGNOSIS — Z13820 Encounter for screening for osteoporosis: Secondary | ICD-10-CM | POA: Insufficient documentation

## 2012-11-24 DIAGNOSIS — M8580 Other specified disorders of bone density and structure, unspecified site: Secondary | ICD-10-CM

## 2012-11-30 ENCOUNTER — Encounter: Payer: Self-pay | Admitting: Family Medicine

## 2012-11-30 ENCOUNTER — Other Ambulatory Visit: Payer: Self-pay | Admitting: Family Medicine

## 2012-11-30 DIAGNOSIS — M810 Age-related osteoporosis without current pathological fracture: Secondary | ICD-10-CM

## 2012-11-30 DIAGNOSIS — M8580 Other specified disorders of bone density and structure, unspecified site: Secondary | ICD-10-CM

## 2012-11-30 MED ORDER — ALENDRONATE SODIUM 70 MG PO TABS: 70.0000 mg | ORAL_TABLET | ORAL | Status: DC

## 2012-11-30 NOTE — Assessment & Plan Note (Signed)
Called and informed.

## 2013-01-08 ENCOUNTER — Other Ambulatory Visit: Payer: Self-pay | Admitting: Family Medicine

## 2013-01-08 DIAGNOSIS — I10 Essential (primary) hypertension: Secondary | ICD-10-CM

## 2013-01-08 NOTE — Assessment & Plan Note (Signed)
E request refill 

## 2013-01-10 ENCOUNTER — Ambulatory Visit (INDEPENDENT_AMBULATORY_CARE_PROVIDER_SITE_OTHER): Payer: Medicare Other | Admitting: Family Medicine

## 2013-01-10 ENCOUNTER — Encounter: Payer: Self-pay | Admitting: Family Medicine

## 2013-01-10 VITALS — BP 164/62 | HR 60 | Temp 98.1°F | Ht 65.0 in | Wt 160.0 lb

## 2013-01-10 DIAGNOSIS — IMO0002 Reserved for concepts with insufficient information to code with codable children: Secondary | ICD-10-CM

## 2013-01-10 DIAGNOSIS — S40811A Abrasion of right upper arm, initial encounter: Secondary | ICD-10-CM

## 2013-01-10 DIAGNOSIS — Z23 Encounter for immunization: Secondary | ICD-10-CM

## 2013-01-10 DIAGNOSIS — I10 Essential (primary) hypertension: Secondary | ICD-10-CM

## 2013-01-10 NOTE — Progress Notes (Signed)
  Subjective:    Patient ID: Gina Bishop, female    DOB: 04-12-1931, 79 y.o.   MRN: [id number]  HPI Small abrasion on arm. Not infected.  Needs booster tetanus. Home BPs are good.     No complaints, feels well    Review of Systems     Objective:   Physical Exam Lungs clear Cardiac RRR without m or g Small non infected abrasion rt arm.        Assessment & Plan:

## 2013-01-10 NOTE — Assessment & Plan Note (Signed)
Needs tetanus

## 2013-01-10 NOTE — Assessment & Plan Note (Signed)
Good control, no change. 

## 2013-01-10 NOTE — Patient Instructions (Addendum)
You look good - Happy Thanksgiving. You get a tetanus shot today. See me in a year as long as your home BP readings are 140/90 or below on average.

## 2013-01-29 ENCOUNTER — Other Ambulatory Visit: Payer: Self-pay | Admitting: Family Medicine

## 2013-03-07 ENCOUNTER — Encounter: Payer: Self-pay | Admitting: Family Medicine

## 2013-03-07 NOTE — Progress Notes (Signed)
Patient ID: Gina Bishop, female   DOB: 05-05-1931, 80 y.o.   MRN: [id number] Received copy of normal mammogram from 03/06/13 from [facility].

## 2013-04-11 ENCOUNTER — Other Ambulatory Visit: Payer: Self-pay | Admitting: Family Medicine

## 2013-04-20 ENCOUNTER — Other Ambulatory Visit: Payer: Self-pay | Admitting: Family Medicine

## 2013-07-25 ENCOUNTER — Telehealth: Payer: Self-pay | Admitting: Family Medicine

## 2013-08-07 ENCOUNTER — Encounter: Payer: Self-pay | Admitting: Home Health Services

## 2013-08-07 ENCOUNTER — Ambulatory Visit (INDEPENDENT_AMBULATORY_CARE_PROVIDER_SITE_OTHER): Payer: Medicare Other | Admitting: Home Health Services

## 2013-08-07 VITALS — BP 157/81 | HR 60 | Temp 97.9°F | Ht 65.0 in | Wt 165.9 lb

## 2013-08-07 DIAGNOSIS — Z0000 Encounter for general adult medical examination without abnormal findings: Secondary | ICD-10-CM

## 2013-08-07 NOTE — Progress Notes (Signed)
Patient ID: Gina Bishop, female   DOB: [birth year], 80 y.o.   MRN: [id number] I have reviewed this visit and discussed with [name] and agree with her documentation.

## 2013-08-07 NOTE — Progress Notes (Signed)
Patient here for annual wellness visit, patient reports: Risk Factors/Conditions needing evaluation or treatment: Pt. foesn't report having any risk factors that need evaluation.  Home Safety: Pt lives at home, with her husband in a 2 story home.  Pt reports having smoke alarms and has adaptive equipment.  Other Information: Corrective lens: Pt has daily corrective lens, has annual eye exams. Pt reports that her eye doctor mentioned that she may need to have cataract surgery soon.  Dentures: Pt doesn't have dentures, has annual dental exams. Pt reports going to the dental 3 times a year for a deep cleaning. Memory: Pt reports no memory problems.  Patient's Mini Mental Score (recorded in doc. flowsheet): 28 Bladder:  Pt reports no problems with bladder control.  ADL/IADL:  Pt reports independence in all functions. Balance/Gait: Pt reports 0 falls in the past year.  We discussed home safety and fall prevention.    Balance Abnormal Patient value  Sitting balance    Sit to stand    Attempts to arise    Immediate standing balance    Standing balance    Nudge    Eyes closed- Romberg    Tandem stance    Back lean    Neck Rotation    360 degree turn X Pt reported having a pinch in her neck when rotating.   Sitting down     Pt reports having a hammer toe that is affecting her walking.    Pt reports having her prevnar shot in October 2014.   Annual Wellness Visit Requirements Recorded Today In  Medical, family, social history Past Medical, Family, Social History Section  Current providers Care team  Current medications Medications  Wt, BP, Ht, BMI Vital signs     Hearing assessment (welcome visit) Hearing/vision  Tobacco, alcohol, illicit drug use History  ADL Nurse Assessment  Depression Screening Nurse Assessment  Cognitive impairment Nurse Assessment  Mini Mental Status Document Flowsheet  Fall Risk Fall/Depression  Home Safety Progress Note  End of Life Planning (welcome  visit) Social Documentation  Medicare preventative services Progress Note  Risk factors/conditions needing evaluation/treatment Progress Note  Personalized health advice Patient Instructions, goals, letter  Diet & Exercise Social Documentation  Emergency Contact Social Documentation  Seat Belts Social Documentation  Sun exposure/protection Social Documentation

## 2013-08-07 NOTE — Progress Notes (Signed)
I have reviewed and agree with [facility] Cover's documentation.  Gedalia Mcmillon NICOLE Zyair Russi

## 2013-08-08 NOTE — Telephone Encounter (Signed)
Called to schedule IAWV. Pt will talk to husband and call back to make appointment time that can fit them both on the schedule. When pt returns call please make appt for 120 minutes.

## 2013-09-07 ENCOUNTER — Other Ambulatory Visit: Payer: Self-pay | Admitting: Family Medicine

## 2013-11-21 ENCOUNTER — Encounter: Payer: Self-pay | Admitting: Family Medicine

## 2013-11-21 ENCOUNTER — Ambulatory Visit (INDEPENDENT_AMBULATORY_CARE_PROVIDER_SITE_OTHER): Payer: Medicare Other | Admitting: Family Medicine

## 2013-11-21 VITALS — BP 175/80 | HR 67 | Temp 97.8°F | Ht 65.0 in | Wt 166.0 lb

## 2013-11-21 DIAGNOSIS — M47812 Spondylosis without myelopathy or radiculopathy, cervical region: Secondary | ICD-10-CM | POA: Diagnosis not present

## 2013-11-21 DIAGNOSIS — E559 Vitamin D deficiency, unspecified: Secondary | ICD-10-CM

## 2013-11-21 DIAGNOSIS — Z0000 Encounter for general adult medical examination without abnormal findings: Secondary | ICD-10-CM | POA: Diagnosis not present

## 2013-11-21 DIAGNOSIS — E7800 Pure hypercholesterolemia, unspecified: Secondary | ICD-10-CM

## 2013-11-21 DIAGNOSIS — L259 Unspecified contact dermatitis, unspecified cause: Secondary | ICD-10-CM | POA: Diagnosis not present

## 2013-11-21 DIAGNOSIS — Z23 Encounter for immunization: Secondary | ICD-10-CM | POA: Diagnosis not present

## 2013-11-21 DIAGNOSIS — I10 Essential (primary) hypertension: Secondary | ICD-10-CM

## 2013-11-21 MED ORDER — BENAZEPRIL HCL 40 MG PO TABS: 40.0000 mg | ORAL_TABLET | Freq: Every day | ORAL | Status: DC

## 2013-11-21 MED ORDER — PREDNISONE 10 MG PO TABS: 10.0000 mg | ORAL_TABLET | Freq: Two times a day (BID) | ORAL | Status: DC

## 2013-11-21 MED ORDER — AMLODIPINE BESYLATE 10 MG PO TABS: 10.0000 mg | ORAL_TABLET | Freq: Every day | ORAL | Status: DC

## 2013-11-21 MED ORDER — MELOXICAM 7.5 MG PO TABS: 7.5000 mg | ORAL_TABLET | Freq: Every day | ORAL | Status: DC

## 2013-11-21 MED ORDER — DICYCLOMINE HCL 10 MG PO CAPS: 10.0000 mg | ORAL_CAPSULE | Freq: Three times a day (TID) | ORAL | Status: DC

## 2013-11-21 MED ORDER — PRAMIPEXOLE DIHYDROCHLORIDE 0.5 MG PO TABS: 0.5000 mg | ORAL_TABLET | Freq: Every evening | ORAL | Status: DC | PRN

## 2013-11-21 MED ORDER — METOPROLOL SUCCINATE ER 25 MG PO TB24: 25.0000 mg | ORAL_TABLET | Freq: Every day | ORAL | Status: DC

## 2013-11-21 NOTE — Patient Instructions (Signed)
You seem to be in good general health. Take the prednisone twice daily for the next 10 days. You will be due for a mammogram in Jan 2017. I sent in refills. Because your blood pressure is high, I increased your amlodipine to 10 mg daily.  Take two of the old supply to use them up. I will call with the blood test results.

## 2013-11-21 NOTE — Assessment & Plan Note (Signed)
Check lab

## 2013-11-22 ENCOUNTER — Encounter: Payer: Self-pay | Admitting: Family Medicine

## 2013-11-22 DIAGNOSIS — Z0000 Encounter for general adult medical examination without abnormal findings: Secondary | ICD-10-CM | POA: Insufficient documentation

## 2013-11-22 LAB — COMPREHENSIVE METABOLIC PANEL
ALBUMIN: 4.4 g/dL (ref 3.5–5.2)
ALT: 14 U/L (ref 0–35)
AST: 20 U/L (ref 0–37)
Alkaline Phosphatase: 66 U/L (ref 39–117)
BILIRUBIN TOTAL: 0.7 mg/dL (ref 0.2–1.2)
BUN: 16 mg/dL (ref 6–23)
CO2: 25 mEq/L (ref 19–32)
Calcium: 9.7 mg/dL (ref 8.4–10.5)
Chloride: 96 mEq/L (ref 96–112)
Creat: 0.73 mg/dL (ref 0.50–1.10)
Glucose, Bld: 90 mg/dL (ref 70–99)
Potassium: 4.1 mEq/L (ref 3.5–5.3)
Sodium: 134 mEq/L — ABNORMAL LOW (ref 135–145)
TOTAL PROTEIN: 6.7 g/dL (ref 6.0–8.3)

## 2013-11-22 LAB — LIPID PANEL
Cholesterol: 196 mg/dL (ref 0–200)
HDL: 59 mg/dL (ref >39)
LDL CALC: 110 mg/dL — AB (ref 0–99)
Total CHOL/HDL Ratio: 3.3 Ratio
Triglycerides: 134 mg/dL (ref <150)
VLDL: 27 mg/dL (ref 0–40)

## 2013-11-22 LAB — VITAMIN D 25 HYDROXY (VIT D DEFICIENCY, FRACTURES): Vit D, 25-Hydroxy: 46 ng/mL (ref 30–89)

## 2013-11-22 NOTE — Assessment & Plan Note (Signed)
Poor control.  Increase amlodipine.   

## 2013-11-22 NOTE — Progress Notes (Signed)
   Subjective:    Patient ID: Vidya Bamford, female    DOB: Sep 01, 1931, 80 y.o.   MRN: [id number]  HPI  Annual exam Coincidentally, has face and rash arms.  Was picking up wet leaves in garden BP not under control She has gained some wt  Up to date on HPDP Healthy, active lifestyle.  She is still young old.    Review of Systems 12 point ROS negative.     Objective:   Physical Exam HEENT Face rash typical of contact dermatitis,  Neck supple Lungs clear Cardiac RRR without m or g Abd benign Ext no edema  Contact dermatitis of arms. Neuro WNL       Assessment & Plan:

## 2013-11-22 NOTE — Assessment & Plan Note (Signed)
Healthy lifestyle

## 2013-11-22 NOTE — Assessment & Plan Note (Signed)
Prednisone

## 2014-01-31 ENCOUNTER — Other Ambulatory Visit: Payer: Self-pay | Admitting: Family Medicine

## 2014-01-31 DIAGNOSIS — M47812 Spondylosis without myelopathy or radiculopathy, cervical region: Secondary | ICD-10-CM

## 2014-01-31 NOTE — Assessment & Plan Note (Signed)
Refill per e request 

## 2014-04-05 DIAGNOSIS — H524 Presbyopia: Secondary | ICD-10-CM | POA: Diagnosis not present

## 2014-04-05 DIAGNOSIS — H40053 Ocular hypertension, bilateral: Secondary | ICD-10-CM | POA: Diagnosis not present

## 2014-04-05 DIAGNOSIS — H52223 Regular astigmatism, bilateral: Secondary | ICD-10-CM | POA: Diagnosis not present

## 2014-04-05 DIAGNOSIS — H43813 Vitreous degeneration, bilateral: Secondary | ICD-10-CM | POA: Diagnosis not present

## 2014-04-05 DIAGNOSIS — H2513 Age-related nuclear cataract, bilateral: Secondary | ICD-10-CM | POA: Diagnosis not present

## 2014-04-16 DIAGNOSIS — H2511 Age-related nuclear cataract, right eye: Secondary | ICD-10-CM | POA: Diagnosis not present

## 2014-04-26 DIAGNOSIS — H2512 Age-related nuclear cataract, left eye: Secondary | ICD-10-CM | POA: Diagnosis not present

## 2014-05-14 DIAGNOSIS — H2513 Age-related nuclear cataract, bilateral: Secondary | ICD-10-CM | POA: Diagnosis not present

## 2014-05-14 DIAGNOSIS — H2512 Age-related nuclear cataract, left eye: Secondary | ICD-10-CM | POA: Diagnosis not present

## 2014-05-22 ENCOUNTER — Other Ambulatory Visit: Payer: Self-pay | Admitting: Family Medicine

## 2014-06-13 DIAGNOSIS — D2371 Other benign neoplasm of skin of right lower limb, including hip: Secondary | ICD-10-CM | POA: Diagnosis not present

## 2014-06-14 DIAGNOSIS — L720 Epidermal cyst: Secondary | ICD-10-CM | POA: Diagnosis not present

## 2014-06-14 DIAGNOSIS — D485 Neoplasm of uncertain behavior of skin: Secondary | ICD-10-CM | POA: Diagnosis not present

## 2014-06-14 DIAGNOSIS — L821 Other seborrheic keratosis: Secondary | ICD-10-CM | POA: Diagnosis not present

## 2014-07-01 ENCOUNTER — Other Ambulatory Visit: Payer: Self-pay | Admitting: Family Medicine

## 2014-08-28 DIAGNOSIS — D2271 Melanocytic nevi of right lower limb, including hip: Secondary | ICD-10-CM | POA: Diagnosis not present

## 2014-08-28 DIAGNOSIS — L570 Actinic keratosis: Secondary | ICD-10-CM | POA: Diagnosis not present

## 2014-08-28 DIAGNOSIS — D2272 Melanocytic nevi of left lower limb, including hip: Secondary | ICD-10-CM | POA: Diagnosis not present

## 2014-08-28 DIAGNOSIS — L821 Other seborrheic keratosis: Secondary | ICD-10-CM | POA: Diagnosis not present

## 2014-08-28 DIAGNOSIS — L720 Epidermal cyst: Secondary | ICD-10-CM | POA: Diagnosis not present

## 2014-08-28 DIAGNOSIS — D225 Melanocytic nevi of trunk: Secondary | ICD-10-CM | POA: Diagnosis not present

## 2014-08-29 ENCOUNTER — Other Ambulatory Visit: Payer: Self-pay | Admitting: Family Medicine

## 2014-09-27 DIAGNOSIS — H02834 Dermatochalasis of left upper eyelid: Secondary | ICD-10-CM | POA: Diagnosis not present

## 2014-09-27 DIAGNOSIS — H52223 Regular astigmatism, bilateral: Secondary | ICD-10-CM | POA: Diagnosis not present

## 2014-09-27 DIAGNOSIS — Z961 Presence of intraocular lens: Secondary | ICD-10-CM | POA: Diagnosis not present

## 2014-09-27 DIAGNOSIS — H43813 Vitreous degeneration, bilateral: Secondary | ICD-10-CM | POA: Diagnosis not present

## 2014-09-27 DIAGNOSIS — H40053 Ocular hypertension, bilateral: Secondary | ICD-10-CM | POA: Diagnosis not present

## 2014-09-27 DIAGNOSIS — H02831 Dermatochalasis of right upper eyelid: Secondary | ICD-10-CM | POA: Diagnosis not present

## 2014-11-28 ENCOUNTER — Ambulatory Visit: Payer: Medicare Other | Admitting: Family Medicine

## 2014-11-28 ENCOUNTER — Encounter: Payer: Self-pay | Admitting: Family Medicine

## 2014-11-28 ENCOUNTER — Ambulatory Visit (INDEPENDENT_AMBULATORY_CARE_PROVIDER_SITE_OTHER): Payer: Medicare Other | Admitting: Family Medicine

## 2014-11-28 VITALS — BP 155/70 | HR 60 | Temp 98.1°F | Ht 65.0 in | Wt 166.1 lb

## 2014-11-28 DIAGNOSIS — I10 Essential (primary) hypertension: Secondary | ICD-10-CM

## 2014-11-28 DIAGNOSIS — Z23 Encounter for immunization: Secondary | ICD-10-CM

## 2014-11-28 DIAGNOSIS — E7800 Pure hypercholesterolemia, unspecified: Secondary | ICD-10-CM

## 2014-11-28 DIAGNOSIS — M810 Age-related osteoporosis without current pathological fracture: Secondary | ICD-10-CM

## 2014-11-28 DIAGNOSIS — E559 Vitamin D deficiency, unspecified: Secondary | ICD-10-CM

## 2014-11-28 DIAGNOSIS — M47812 Spondylosis without myelopathy or radiculopathy, cervical region: Secondary | ICD-10-CM

## 2014-11-28 LAB — COMPREHENSIVE METABOLIC PANEL
ALBUMIN: 4.5 g/dL (ref 3.6–5.1)
ALT: 17 U/L (ref 6–29)
AST: 26 U/L (ref 10–35)
Alkaline Phosphatase: 62 U/L (ref 33–130)
BUN: 20 mg/dL (ref 7–25)
CHLORIDE: 97 mmol/L — AB (ref 98–110)
CO2: 24 mmol/L (ref 20–31)
Calcium: 9.5 mg/dL (ref 8.6–10.4)
Creat: 0.93 mg/dL — ABNORMAL HIGH (ref 0.60–0.88)
Glucose, Bld: 93 mg/dL (ref 65–99)
POTASSIUM: 4.8 mmol/L (ref 3.5–5.3)
Sodium: 133 mmol/L — ABNORMAL LOW (ref 135–146)
TOTAL PROTEIN: 7.1 g/dL (ref 6.1–8.1)
Total Bilirubin: 0.6 mg/dL (ref 0.2–1.2)

## 2014-11-28 LAB — LIPID PANEL
CHOL/HDL RATIO: 3.8 ratio (ref <=5.0)
Cholesterol: 225 mg/dL — ABNORMAL HIGH (ref 125–200)
HDL: 60 mg/dL (ref >=46)
LDL Cholesterol: 129 mg/dL (ref <130)
TRIGLYCERIDES: 181 mg/dL — AB (ref <150)
VLDL: 36 mg/dL — ABNORMAL HIGH (ref <30)

## 2014-11-28 LAB — TSH: TSH: 2.825 u[IU]/mL (ref 0.350–4.500)

## 2014-11-28 MED ORDER — AMLODIPINE BESYLATE 10 MG PO TABS: 10.0000 mg | ORAL_TABLET | Freq: Every day | ORAL | Status: DC

## 2014-11-28 MED ORDER — BENAZEPRIL HCL 40 MG PO TABS: 40.0000 mg | ORAL_TABLET | Freq: Every day | ORAL | Status: DC

## 2014-11-28 MED ORDER — PRAVASTATIN SODIUM 40 MG PO TABS: 40.0000 mg | ORAL_TABLET | Freq: Every day | ORAL | Status: DC

## 2014-11-28 MED ORDER — ASPIRIN 81 MG PO TABS: 81.0000 mg | ORAL_TABLET | Freq: Every day | ORAL | Status: DC

## 2014-11-28 MED ORDER — MELOXICAM 7.5 MG PO TABS: 7.5000 mg | ORAL_TABLET | Freq: Every day | ORAL | Status: DC

## 2014-11-28 MED ORDER — GABAPENTIN 100 MG PO CAPS: 100.0000 mg | ORAL_CAPSULE | Freq: Every day | ORAL | Status: DC

## 2014-11-28 MED ORDER — ALENDRONATE SODIUM 70 MG PO TABS: ORAL_TABLET | ORAL | Status: DC

## 2014-11-28 MED ORDER — CHLORTHALIDONE 25 MG PO TABS: ORAL_TABLET | ORAL | Status: DC

## 2014-11-28 MED ORDER — METOPROLOL SUCCINATE ER 25 MG PO TB24: 25.0000 mg | ORAL_TABLET | Freq: Every day | ORAL | Status: DC

## 2014-11-28 NOTE — Assessment & Plan Note (Signed)
At goal per home BP readings.

## 2014-11-28 NOTE — Progress Notes (Signed)
   Subjective:    Patient ID: Gina Bishop, female    DOB: [birth year], 81 y.o.   MRN: [id number]  HPI Follow up of multiple problems.  Only complaint is trigger finger, middle finger, left hand.  Issues 1. Trigger finger, currently coping.  Reluctant for steroid injection or surg referral 2. Hypertension.  Isolated systolic.  No CP, SOB or claudication.  Never stroke or MI.  Renal function has been normal.  Good med compliance.  Brings in home BPs with > 50% at goal.  Some have isolated systolic hypertension. 3. Osteoporosis.  On fosamax x 2 years.  Tolerating well.  Plan to stop no later than 5 years. 4.  HPDP She should continue mammogram screening since she is so healthy.  Needs flu shot and lipids.  Otherwise up to date. 5. Active with chair yoga and other exercises.  Wt stable.    Review of Systems     Objective:   Physical Exam HEENT normal Neck without nodes or thyromegally Lungs clear Cardiac RRR with 1/6 SEM Abd benign.   Ext no edema good pulses Neuro, normal gait and memory.        Assessment & Plan:

## 2014-11-28 NOTE — Assessment & Plan Note (Signed)
Recheck now that she is off replacement.

## 2014-11-28 NOTE — Assessment & Plan Note (Signed)
Stable on gabapentin and mobic.

## 2014-11-28 NOTE — Assessment & Plan Note (Signed)
Continue fosamax.  No need to recheck bone density.  It would not change rx

## 2014-11-28 NOTE — Patient Instructions (Addendum)
Make sure you show this to [facility].  You are a very compliant, easy patient who I enjoy seeing.   Let's take the fosamax for at least one more year. I will call with the blood test results. No changes in your medications.   See me in one year.  Sooner if home blood pressure creep up.

## 2014-11-29 ENCOUNTER — Telehealth: Payer: Self-pay

## 2014-11-29 ENCOUNTER — Telehealth: Payer: Self-pay | Admitting: *Deleted

## 2014-11-29 LAB — VITAMIN D 25 HYDROXY (VIT D DEFICIENCY, FRACTURES): VIT D 25 HYDROXY: 33 ng/mL (ref 30–100)

## 2014-11-29 NOTE — Telephone Encounter (Signed)
See previous phone note. Ottis Stain, CMA

## 2014-11-29 NOTE — Telephone Encounter (Signed)
Spoke to Gina Bishop. Gave her the information below. She will call and set an appt to see Dr. Andria Frames. Ottis Stain, CMA

## 2014-11-29 NOTE — Telephone Encounter (Signed)
-----   Message from Kinnie Feil, MD sent at 11/29/2014  8:51 AM EDT ----- Please call patient on behalf of Dr Andria Frames. 1. Her Vitamin D level and thyroid test were normal. 2. Cholesterol test slightly worsened from the last one done 1 yr ago. Please return to see Dr Andria Frames soon to discuss does adjustment.

## 2014-11-29 NOTE — Telephone Encounter (Signed)
LM with spouse to have wife return call to inform her of the information below.  Told him that she could ask to speak to one of Dr. Mike Craze team CMAs to inform her of below. Gina Bishop, April D, [place]

## 2014-12-02 ENCOUNTER — Encounter: Payer: Self-pay | Admitting: Family Medicine

## 2015-01-03 DIAGNOSIS — H52223 Regular astigmatism, bilateral: Secondary | ICD-10-CM | POA: Diagnosis not present

## 2015-01-03 DIAGNOSIS — H02834 Dermatochalasis of left upper eyelid: Secondary | ICD-10-CM | POA: Diagnosis not present

## 2015-01-03 DIAGNOSIS — H04123 Dry eye syndrome of bilateral lacrimal glands: Secondary | ICD-10-CM | POA: Diagnosis not present

## 2015-01-03 DIAGNOSIS — Z961 Presence of intraocular lens: Secondary | ICD-10-CM | POA: Diagnosis not present

## 2015-01-03 DIAGNOSIS — H40053 Ocular hypertension, bilateral: Secondary | ICD-10-CM | POA: Diagnosis not present

## 2015-01-03 DIAGNOSIS — H43813 Vitreous degeneration, bilateral: Secondary | ICD-10-CM | POA: Diagnosis not present

## 2015-01-03 DIAGNOSIS — H02831 Dermatochalasis of right upper eyelid: Secondary | ICD-10-CM | POA: Diagnosis not present

## 2015-01-22 ENCOUNTER — Other Ambulatory Visit: Payer: Self-pay | Admitting: Family Medicine

## 2015-01-23 DIAGNOSIS — Z1231 Encounter for screening mammogram for malignant neoplasm of breast: Secondary | ICD-10-CM | POA: Diagnosis not present

## 2015-05-03 DIAGNOSIS — I10 Essential (primary) hypertension: Secondary | ICD-10-CM | POA: Diagnosis not present

## 2015-05-03 DIAGNOSIS — Z780 Asymptomatic menopausal state: Secondary | ICD-10-CM | POA: Diagnosis not present

## 2015-05-03 DIAGNOSIS — R5383 Other fatigue: Secondary | ICD-10-CM | POA: Diagnosis not present

## 2015-05-03 DIAGNOSIS — Z91013 Allergy to seafood: Secondary | ICD-10-CM | POA: Diagnosis not present

## 2015-05-03 DIAGNOSIS — T502X5A Adverse effect of carbonic-anhydrase inhibitors, benzothiadiazides and other diuretics, initial encounter: Secondary | ICD-10-CM | POA: Diagnosis not present

## 2015-05-03 DIAGNOSIS — E871 Hypo-osmolality and hyponatremia: Secondary | ICD-10-CM | POA: Diagnosis not present

## 2015-05-03 DIAGNOSIS — Z7983 Long term (current) use of bisphosphonates: Secondary | ICD-10-CM | POA: Diagnosis not present

## 2015-05-03 DIAGNOSIS — Z66 Do not resuscitate: Secondary | ICD-10-CM | POA: Diagnosis present

## 2015-05-03 DIAGNOSIS — R404 Transient alteration of awareness: Secondary | ICD-10-CM | POA: Diagnosis not present

## 2015-05-03 DIAGNOSIS — E785 Hyperlipidemia, unspecified: Secondary | ICD-10-CM | POA: Diagnosis not present

## 2015-05-03 DIAGNOSIS — G2581 Restless legs syndrome: Secondary | ICD-10-CM | POA: Diagnosis not present

## 2015-05-03 DIAGNOSIS — Z885 Allergy status to narcotic agent status: Secondary | ICD-10-CM | POA: Diagnosis not present

## 2015-05-03 DIAGNOSIS — E222 Syndrome of inappropriate secretion of antidiuretic hormone: Secondary | ICD-10-CM | POA: Diagnosis present

## 2015-05-03 DIAGNOSIS — M1990 Unspecified osteoarthritis, unspecified site: Secondary | ICD-10-CM | POA: Diagnosis present

## 2015-05-03 DIAGNOSIS — R5381 Other malaise: Secondary | ICD-10-CM | POA: Diagnosis not present

## 2015-05-03 DIAGNOSIS — R05 Cough: Secondary | ICD-10-CM | POA: Diagnosis not present

## 2015-05-03 DIAGNOSIS — R911 Solitary pulmonary nodule: Secondary | ICD-10-CM | POA: Diagnosis not present

## 2015-05-03 DIAGNOSIS — Z881 Allergy status to other antibiotic agents status: Secondary | ICD-10-CM | POA: Diagnosis not present

## 2015-05-03 DIAGNOSIS — R531 Weakness: Secondary | ICD-10-CM | POA: Diagnosis not present

## 2015-05-03 DIAGNOSIS — Z79899 Other long term (current) drug therapy: Secondary | ICD-10-CM | POA: Diagnosis not present

## 2015-05-03 DIAGNOSIS — E7800 Pure hypercholesterolemia, unspecified: Secondary | ICD-10-CM | POA: Diagnosis not present

## 2015-05-03 DIAGNOSIS — M810 Age-related osteoporosis without current pathological fracture: Secondary | ICD-10-CM | POA: Diagnosis present

## 2015-05-03 DIAGNOSIS — Z7982 Long term (current) use of aspirin: Secondary | ICD-10-CM | POA: Diagnosis not present

## 2015-05-05 ENCOUNTER — Ambulatory Visit: Payer: Medicare Other | Admitting: Family Medicine

## 2015-05-08 ENCOUNTER — Ambulatory Visit (INDEPENDENT_AMBULATORY_CARE_PROVIDER_SITE_OTHER): Payer: Medicare Other | Admitting: Family Medicine

## 2015-05-08 ENCOUNTER — Encounter: Payer: Self-pay | Admitting: Family Medicine

## 2015-05-08 ENCOUNTER — Ambulatory Visit (HOSPITAL_COMMUNITY)
Admission: RE | Admit: 2015-05-08 | Discharge: 2015-05-08 | Disposition: A | Discharge status: Home / Self Care | Payer: Medicare Other | Source: Ambulatory Visit | Attending: Family Medicine | Admitting: Family Medicine

## 2015-05-08 VITALS — BP 148/47 | HR 41 | Temp 98.3°F | Ht 65.0 in | Wt 169.4 lb

## 2015-05-08 DIAGNOSIS — E871 Hypo-osmolality and hyponatremia: Secondary | ICD-10-CM

## 2015-05-08 DIAGNOSIS — I491 Atrial premature depolarization: Secondary | ICD-10-CM | POA: Insufficient documentation

## 2015-05-08 DIAGNOSIS — I493 Ventricular premature depolarization: Secondary | ICD-10-CM | POA: Insufficient documentation

## 2015-05-08 DIAGNOSIS — I10 Essential (primary) hypertension: Secondary | ICD-10-CM | POA: Diagnosis not present

## 2015-05-08 DIAGNOSIS — R911 Solitary pulmonary nodule: Secondary | ICD-10-CM | POA: Diagnosis not present

## 2015-05-08 DIAGNOSIS — J111 Influenza due to unidentified influenza virus with other respiratory manifestations: Secondary | ICD-10-CM

## 2015-05-08 DIAGNOSIS — M545 Low back pain: Secondary | ICD-10-CM

## 2015-05-08 DIAGNOSIS — M79605 Pain in left leg: Secondary | ICD-10-CM | POA: Insufficient documentation

## 2015-05-08 DIAGNOSIS — R69 Illness, unspecified: Secondary | ICD-10-CM

## 2015-05-08 DIAGNOSIS — I499 Cardiac arrhythmia, unspecified: Secondary | ICD-10-CM | POA: Diagnosis not present

## 2015-05-08 LAB — POCT URINALYSIS DIPSTICK
Bilirubin, UA: NEGATIVE
Glucose, UA: NEGATIVE
LEUKOCYTES UA: NEGATIVE
NITRITE UA: NEGATIVE
PH UA: 6.5
PROTEIN UA: 30
RBC UA: NEGATIVE
Spec Grav, UA: 1.02
Urobilinogen, UA: 0.2

## 2015-05-08 MED ORDER — GUAIFENESIN-CODEINE 100-10 MG/5ML PO SOLN: 5.0000 mL | Freq: Three times a day (TID) | ORAL | Status: DC | PRN

## 2015-05-08 MED ORDER — PRAVASTATIN SODIUM 40 MG PO TABS: 40.0000 mg | ORAL_TABLET | Freq: Every day | ORAL | Status: DC

## 2015-05-08 MED ORDER — METOPROLOL SUCCINATE ER 50 MG PO TB24: 50.0000 mg | ORAL_TABLET | Freq: Every day | ORAL | Status: DC

## 2015-05-08 NOTE — Patient Instructions (Addendum)
See me in one week Get blood work done a day or two earlier. Stop the hydralazine. Go back to taking the benazapril/lotensin. I am going to ignore the back pain and vertigo for now.  See me sooner if the cough gets worse. We will plan on doing a repeat CT of the chest for the nodule in 6 months.  Late Aug/early Sept.

## 2015-05-09 NOTE — Assessment & Plan Note (Signed)
Appears to be resolving.  

## 2015-05-09 NOTE — Progress Notes (Signed)
   Subjective:    Patient ID: Gina Bishop, female    DOB: 12-25-1931, 82 y.o.   MRN: [id number]  HPI FU hospitalization.  She was admitted to [facility] for ILI and hyponatremia.  Hyponatremia is a presumed combo of mild dehydration and thiazide diuretic therapy.  She was improving and her sodium had climbed to the low 130s at DC.  I have DC summary.  Never bumped her creat.  DC date was 3/21 Had cough.  No pneumonia either on CXR or CT chest.  Ct chest did show a 7 mm solitary nodule.  She is low risk.  Never smoked.  No suspicious radiographic characteristics. Still with cough. New symptoms of vertigo this morning. Also low back discomfort.  No constipation, urgency or frequency. No fever.     Review of Systems     Objective:   Physical Exam  Gen non toxic Eyes, no nystagmus Lungs clear Cardiac.  Irregular. Felt like bigeminy.   Abd benign  UA clean        Assessment & Plan:

## 2015-05-09 NOTE — Assessment & Plan Note (Signed)
Restart ACE which they had held. DC hydralazine, which they had started. Continue metoprolol and amlodipine. Recheck in one week.  Will need BMP

## 2015-05-09 NOTE — Assessment & Plan Note (Signed)
Needs FU in 6 months.

## 2015-05-09 NOTE — Assessment & Plan Note (Signed)
EKG is reassuring with PVC and PAC.   Will recheck in one week.  May want echo if arrhythmia persists.

## 2015-05-13 ENCOUNTER — Other Ambulatory Visit: Payer: Medicare Other

## 2015-05-13 DIAGNOSIS — E871 Hypo-osmolality and hyponatremia: Secondary | ICD-10-CM | POA: Diagnosis not present

## 2015-05-13 NOTE — Progress Notes (Signed)
Bmp done today Gina Bishop 

## 2015-05-14 ENCOUNTER — Ambulatory Visit: Payer: Medicare Other | Admitting: Family Medicine

## 2015-05-14 LAB — BASIC METABOLIC PANEL
BUN: 17 mg/dL (ref 7–25)
CALCIUM: 9.2 mg/dL (ref 8.6–10.4)
CHLORIDE: 94 mmol/L — AB (ref 98–110)
CO2: 27 mmol/L (ref 20–31)
CREATININE: 0.86 mg/dL (ref 0.60–0.88)
Glucose, Bld: 85 mg/dL (ref 65–99)
Potassium: 4.7 mmol/L (ref 3.5–5.3)
SODIUM: 130 mmol/L — AB (ref 135–146)

## 2015-05-15 ENCOUNTER — Ambulatory Visit (INDEPENDENT_AMBULATORY_CARE_PROVIDER_SITE_OTHER): Payer: Medicare Other | Admitting: Family Medicine

## 2015-05-15 ENCOUNTER — Encounter: Payer: Self-pay | Admitting: Family Medicine

## 2015-05-15 VITALS — BP 156/63 | HR 59 | Temp 98.4°F | Ht 65.0 in | Wt 164.2 lb

## 2015-05-15 DIAGNOSIS — I10 Essential (primary) hypertension: Secondary | ICD-10-CM

## 2015-05-15 DIAGNOSIS — H8109 Meniere's disease, unspecified ear: Secondary | ICD-10-CM | POA: Insufficient documentation

## 2015-05-15 DIAGNOSIS — E871 Hypo-osmolality and hyponatremia: Secondary | ICD-10-CM | POA: Diagnosis present

## 2015-05-15 DIAGNOSIS — H8110 Benign paroxysmal vertigo, unspecified ear: Secondary | ICD-10-CM | POA: Diagnosis not present

## 2015-05-15 MED ORDER — METOPROLOL SUCCINATE ER 25 MG PO TB24: 25.0000 mg | ORAL_TABLET | Freq: Every day | ORAL | Status: DC

## 2015-05-15 NOTE — Assessment & Plan Note (Signed)
She wishes to ride it out for now.  Being cautious to avoid falls.  Refer to PT (neurovestibular rehab) whenever she chooses

## 2015-05-15 NOTE — Progress Notes (Signed)
   Subjective:    Patient ID: Gina Bishop, female    DOB: 1931-07-05, 82 y.o.   MRN: [id number]  HPI Continues to improve post hospitalization.   Back pain has completely resolved.   Vertigo - a new problem last week, is still present.  Clearly vertigo and not orthostasis.  She has noted a definite positional worsening with head/neck movements.  No hearing changes, headache or focal neuro sx. Hyponatremia low grade is still present.  Reviewing old labs, she lives in the 130-135 sodium range.   HBP a bit elevated here but home BPs last week are all good.  She notes bradycardia down into the 40s with her increased metoprolol.  She also notes some nausea with meals which she did not have when taking the 25 mg dose.     Review of Systems     Objective:   Physical Exam Lungs clear Cardiac RRR wihout m or g       Assessment & Plan:

## 2015-05-15 NOTE — Assessment & Plan Note (Signed)
Stable on current meds by home BP.  Monitor as I decrease dose of metoprolol

## 2015-05-15 NOTE — Assessment & Plan Note (Signed)
Stable and asymptomatic.  No intervention needed.

## 2015-05-15 NOTE — Patient Instructions (Signed)
Go back to taking the Metoprolol 25 mg daily.  Let me know if you need a prescription. You have BPPV.  Let me know if you want a referral to physical therapy to move the rocks in your head. Please keep an eye on your blood pressure.  The goal is 140/90, maybe 150/90. In the future,, drink gatorade and have chicken noodle soup whenever dehydration threatens.  Also, don't let things go on too long without getting your sodium checked. If your blood pressure is doing fine, see me in 6 months. If your blood pressure is high, I will need to start another medicine.

## 2015-06-03 ENCOUNTER — Encounter: Payer: Self-pay | Admitting: Family Medicine

## 2015-07-07 DIAGNOSIS — H401131 Primary open-angle glaucoma, bilateral, mild stage: Secondary | ICD-10-CM | POA: Diagnosis not present

## 2015-07-07 DIAGNOSIS — H02834 Dermatochalasis of left upper eyelid: Secondary | ICD-10-CM | POA: Diagnosis not present

## 2015-07-07 DIAGNOSIS — H52223 Regular astigmatism, bilateral: Secondary | ICD-10-CM | POA: Diagnosis not present

## 2015-07-07 DIAGNOSIS — I10 Essential (primary) hypertension: Secondary | ICD-10-CM | POA: Diagnosis not present

## 2015-07-07 DIAGNOSIS — H02831 Dermatochalasis of right upper eyelid: Secondary | ICD-10-CM | POA: Diagnosis not present

## 2015-07-07 DIAGNOSIS — H4313 Vitreous hemorrhage, bilateral: Secondary | ICD-10-CM | POA: Diagnosis not present

## 2015-07-07 DIAGNOSIS — Z961 Presence of intraocular lens: Secondary | ICD-10-CM | POA: Diagnosis not present

## 2015-07-07 DIAGNOSIS — H04123 Dry eye syndrome of bilateral lacrimal glands: Secondary | ICD-10-CM | POA: Diagnosis not present

## 2015-07-22 ENCOUNTER — Other Ambulatory Visit: Payer: Self-pay | Admitting: Family Medicine

## 2015-07-23 ENCOUNTER — Other Ambulatory Visit: Payer: Self-pay | Admitting: Family Medicine

## 2015-07-23 MED ORDER — PRAMIPEXOLE DIHYDROCHLORIDE 0.5 MG PO TABS: 0.5000 mg | ORAL_TABLET | Freq: Every day | ORAL | Status: DC

## 2015-07-24 DIAGNOSIS — D2271 Melanocytic nevi of right lower limb, including hip: Secondary | ICD-10-CM | POA: Diagnosis not present

## 2015-07-24 DIAGNOSIS — D2272 Melanocytic nevi of left lower limb, including hip: Secondary | ICD-10-CM | POA: Diagnosis not present

## 2015-07-24 DIAGNOSIS — L570 Actinic keratosis: Secondary | ICD-10-CM | POA: Diagnosis not present

## 2015-07-24 DIAGNOSIS — B999 Unspecified infectious disease: Secondary | ICD-10-CM | POA: Diagnosis not present

## 2015-07-24 DIAGNOSIS — L821 Other seborrheic keratosis: Secondary | ICD-10-CM | POA: Diagnosis not present

## 2015-07-24 DIAGNOSIS — L720 Epidermal cyst: Secondary | ICD-10-CM | POA: Diagnosis not present

## 2015-07-24 DIAGNOSIS — D225 Melanocytic nevi of trunk: Secondary | ICD-10-CM | POA: Diagnosis not present

## 2015-08-02 DIAGNOSIS — E7800 Pure hypercholesterolemia, unspecified: Secondary | ICD-10-CM | POA: Diagnosis not present

## 2015-08-02 DIAGNOSIS — Z79899 Other long term (current) drug therapy: Secondary | ICD-10-CM | POA: Diagnosis not present

## 2015-08-02 DIAGNOSIS — E871 Hypo-osmolality and hyponatremia: Secondary | ICD-10-CM | POA: Diagnosis not present

## 2015-08-02 DIAGNOSIS — R42 Dizziness and giddiness: Secondary | ICD-10-CM | POA: Diagnosis not present

## 2015-08-02 DIAGNOSIS — I10 Essential (primary) hypertension: Secondary | ICD-10-CM | POA: Diagnosis not present

## 2015-08-02 DIAGNOSIS — H8110 Benign paroxysmal vertigo, unspecified ear: Secondary | ICD-10-CM | POA: Diagnosis not present

## 2015-08-02 DIAGNOSIS — R112 Nausea with vomiting, unspecified: Secondary | ICD-10-CM | POA: Diagnosis not present

## 2015-08-02 DIAGNOSIS — Z7983 Long term (current) use of bisphosphonates: Secondary | ICD-10-CM | POA: Diagnosis not present

## 2015-08-07 ENCOUNTER — Encounter: Payer: Self-pay | Admitting: Family Medicine

## 2015-08-07 ENCOUNTER — Ambulatory Visit (INDEPENDENT_AMBULATORY_CARE_PROVIDER_SITE_OTHER): Payer: Medicare Other | Admitting: Family Medicine

## 2015-08-07 VITALS — BP 139/65 | HR 74 | Temp 98.5°F | Resp 20 | Wt 165.8 lb

## 2015-08-07 DIAGNOSIS — H81399 Other peripheral vertigo, unspecified ear: Secondary | ICD-10-CM

## 2015-08-07 DIAGNOSIS — E871 Hypo-osmolality and hyponatremia: Secondary | ICD-10-CM

## 2015-08-07 DIAGNOSIS — H8109 Meniere's disease, unspecified ear: Secondary | ICD-10-CM

## 2015-08-07 MED ORDER — MECLIZINE HCL 25 MG PO CHEW: 25.0000 mg | CHEWABLE_TABLET | Freq: Four times a day (QID) | ORAL | Status: DC | PRN

## 2015-08-07 MED ORDER — ONDANSETRON HCL 4 MG PO TABS: 4.0000 mg | ORAL_TABLET | Freq: Three times a day (TID) | ORAL | Status: DC | PRN

## 2015-08-07 MED ORDER — MECLIZINE HCL 25 MG PO TABS: 25.0000 mg | ORAL_TABLET | Freq: Three times a day (TID) | ORAL | Status: DC | PRN

## 2015-08-07 NOTE — Patient Instructions (Signed)
I am pretty sure this is an inner ear problem, but I am not as sure the exact diagnosis.  It has proven that it will come back.  Keep some meclizine on hand to be prepared for the next time.

## 2015-08-08 NOTE — Assessment & Plan Note (Signed)
Able to review labs via care everywhere.  Na=132 which is good for her.  No intervention.

## 2015-08-08 NOTE — Assessment & Plan Note (Signed)
I am not sure type of vertigo.  Less likely BPPV since no positional componant.  Regardless, it is improving and has proven that it will recur.  I want her to keep zofran and meclizine on hand for the next time.

## 2015-08-08 NOTE — Progress Notes (Signed)
   Subjective:    Patient ID: Gina Bishop, female    DOB: 1931/08/17, 82 y.o.   MRN: [id number]  HPI Patient had another bout of vertigo.  Non positional this time.  Seen at [facility] ER.  Told she had low Na+ - an old problem for her.  Also had URI with this bout.  No hearing loss.  URI improving.  Vertigo improving.  Responding to meclizine which does not make drowsy.    Review of Systems     Objective:   Physical ExamHEENT TMs normal.  No nystagnus. Gait normal        Assessment & Plan:

## 2015-08-13 ENCOUNTER — Inpatient Hospital Stay: Payer: Medicare Other | Admitting: Family Medicine

## 2015-09-08 ENCOUNTER — Ambulatory Visit: Payer: Medicare Other | Admitting: Family Medicine

## 2015-11-05 ENCOUNTER — Ambulatory Visit (INDEPENDENT_AMBULATORY_CARE_PROVIDER_SITE_OTHER): Payer: Medicare Other | Admitting: Family Medicine

## 2015-11-05 ENCOUNTER — Encounter: Payer: Self-pay | Admitting: Family Medicine

## 2015-11-05 DIAGNOSIS — E7800 Pure hypercholesterolemia, unspecified: Secondary | ICD-10-CM

## 2015-11-05 DIAGNOSIS — Z23 Encounter for immunization: Secondary | ICD-10-CM | POA: Diagnosis not present

## 2015-11-05 DIAGNOSIS — I10 Essential (primary) hypertension: Secondary | ICD-10-CM | POA: Diagnosis not present

## 2015-11-05 DIAGNOSIS — R911 Solitary pulmonary nodule: Secondary | ICD-10-CM | POA: Diagnosis not present

## 2015-11-05 DIAGNOSIS — M810 Age-related osteoporosis without current pathological fracture: Secondary | ICD-10-CM | POA: Diagnosis not present

## 2015-11-05 MED ORDER — BENAZEPRIL HCL 40 MG PO TABS: 40.0000 mg | ORAL_TABLET | Freq: Every day | ORAL | 3 refills | Status: DC

## 2015-11-05 MED ORDER — ALENDRONATE SODIUM 70 MG PO TABS: ORAL_TABLET | ORAL | 3 refills | Status: DC

## 2015-11-05 NOTE — Patient Instructions (Addendum)
ARDA I have ordered future blood work - any time after 1013/17 I will call with CT results. Get Gina Bishop in for a flu shot and chat. Let me know how I can help.

## 2015-11-06 NOTE — Progress Notes (Signed)
   Subjective:    Patient ID: Gina Bishop, female    DOB: 02/17/1931, 82 y.o.   MRN: [id number]  HPI  Follow up, multiple issues.   1. Needs chest ct for FU of incidental pulm nodule seen 6 months ago. 2. Needs flu shot.   3. Situational depression.  Husband had large meningioma removed.  Unfortunately, still with progressive memory loss and personality change.  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  She is coping and has a good support system.  Still, it is hard.  4. Will need cholesterol check, but it is not yet time.   5. Osteoporosis.  Year three of fosamax.  6. HBP tolerating meds welll  Review of Systems  Denies CP, SOB, bowel or bladder change, bleeding.    Objective:   Physical Exam Lungs clear Cardiac RRR without m or g. Teary in office at appropropriate times.        Assessment & Plan:

## 2015-11-06 NOTE — Assessment & Plan Note (Signed)
Recheck fasting labs at one year.

## 2015-11-06 NOTE — Assessment & Plan Note (Signed)
Well controled. 

## 2015-11-06 NOTE — Assessment & Plan Note (Signed)
Ordered FU CT.

## 2015-11-10 ENCOUNTER — Ambulatory Visit
Admission: RE | Admit: 2015-11-10 | Discharge: 2015-11-10 | Disposition: A | Discharge status: Home / Self Care | Payer: Medicare Other | Source: Ambulatory Visit | Attending: Family Medicine | Admitting: Family Medicine

## 2015-11-10 DIAGNOSIS — R911 Solitary pulmonary nodule: Secondary | ICD-10-CM | POA: Diagnosis not present

## 2015-11-10 DIAGNOSIS — E7800 Pure hypercholesterolemia, unspecified: Secondary | ICD-10-CM

## 2015-11-11 NOTE — Assessment & Plan Note (Signed)
Patient informed.  She will consider the follow up CT.  For now, she is reluctant to continue chasing.

## 2015-11-11 NOTE — Assessment & Plan Note (Signed)
Coronary calcifications noted and patient informed.   She is taking statin, ASA and multiple BP meds.  Continue that treatment.

## 2015-12-03 ENCOUNTER — Other Ambulatory Visit: Payer: Self-pay | Admitting: Family Medicine

## 2015-12-03 DIAGNOSIS — I10 Essential (primary) hypertension: Secondary | ICD-10-CM

## 2015-12-16 ENCOUNTER — Other Ambulatory Visit: Payer: Medicare Other

## 2015-12-16 DIAGNOSIS — H8109 Meniere's disease, unspecified ear: Secondary | ICD-10-CM

## 2015-12-16 DIAGNOSIS — E7800 Pure hypercholesterolemia, unspecified: Secondary | ICD-10-CM | POA: Diagnosis not present

## 2015-12-16 DIAGNOSIS — E871 Hypo-osmolality and hyponatremia: Secondary | ICD-10-CM

## 2015-12-16 LAB — COMPLETE METABOLIC PANEL WITH GFR
ALBUMIN: 4.3 g/dL (ref 3.6–5.1)
ALK PHOS: 60 U/L (ref 33–130)
ALT: 14 U/L (ref 6–29)
AST: 20 U/L (ref 10–35)
BILIRUBIN TOTAL: 0.5 mg/dL (ref 0.2–1.2)
BUN: 21 mg/dL (ref 7–25)
CALCIUM: 9.7 mg/dL (ref 8.6–10.4)
CO2: 25 mmol/L (ref 20–31)
CREATININE: 0.85 mg/dL (ref 0.60–0.88)
Chloride: 101 mmol/L (ref 98–110)
GFR, EST AFRICAN AMERICAN: 74 mL/min (ref >=60)
GFR, Est Non African American: 64 mL/min (ref >=60)
Glucose, Bld: 93 mg/dL (ref 65–99)
Potassium: 4.7 mmol/L (ref 3.5–5.3)
Sodium: 138 mmol/L (ref 135–146)
TOTAL PROTEIN: 6.8 g/dL (ref 6.1–8.1)

## 2015-12-16 LAB — LIPID PANEL
CHOLESTEROL: 202 mg/dL — AB (ref 125–200)
HDL: 63 mg/dL (ref >=46)
LDL Cholesterol: 106 mg/dL (ref <130)
TRIGLYCERIDES: 166 mg/dL — AB (ref <150)
Total CHOL/HDL Ratio: 3.2 Ratio (ref <=5.0)
VLDL: 33 mg/dL — ABNORMAL HIGH (ref <30)

## 2015-12-17 NOTE — Addendum Note (Signed)
Addended by: [name] on: 12/17/2015 12:08 PM   Modules accepted: Orders

## 2015-12-17 NOTE — Assessment & Plan Note (Signed)
Resolved off HCTZ 

## 2015-12-17 NOTE — Assessment & Plan Note (Signed)
Given results, no change.

## 2016-01-02 ENCOUNTER — Other Ambulatory Visit: Payer: Self-pay | Admitting: Family Medicine

## 2016-01-02 DIAGNOSIS — M47812 Spondylosis without myelopathy or radiculopathy, cervical region: Secondary | ICD-10-CM

## 2016-01-13 DIAGNOSIS — H401131 Primary open-angle glaucoma, bilateral, mild stage: Secondary | ICD-10-CM | POA: Diagnosis not present

## 2016-01-13 DIAGNOSIS — H04123 Dry eye syndrome of bilateral lacrimal glands: Secondary | ICD-10-CM | POA: Diagnosis not present

## 2016-01-13 DIAGNOSIS — Z961 Presence of intraocular lens: Secondary | ICD-10-CM | POA: Diagnosis not present

## 2016-01-13 DIAGNOSIS — H5213 Myopia, bilateral: Secondary | ICD-10-CM | POA: Diagnosis not present

## 2016-01-14 ENCOUNTER — Ambulatory Visit: Payer: Medicare Other | Attending: Family Medicine | Admitting: *Deleted

## 2016-01-14 ENCOUNTER — Encounter: Payer: Self-pay | Admitting: *Deleted

## 2016-01-14 DIAGNOSIS — R42 Dizziness and giddiness: Secondary | ICD-10-CM

## 2016-01-14 DIAGNOSIS — R2689 Other abnormalities of gait and mobility: Secondary | ICD-10-CM

## 2016-01-14 NOTE — Therapy (Signed)
[facility] [address] [place], [place], [address] Phone: [phone]   Fax:  [phone]  Physical Therapy Evaluation  Patient Details  Name: Gina Bishop MRN: [id number] Date of Birth: [birth year] Referring Provider: Andria Frames  Encounter Date: 01/14/2016      PT End of Session - 01/14/16 1523    Visit Number 1   Number of Visits 9   Date for PT Re-Evaluation 02/27/16   PT Start Time 1100   PT Stop Time 1145   PT Time Calculation (min) 45 min   Activity Tolerance Patient tolerated treatment well   Behavior During Therapy [facility] for tasks assessed/performed      Past Medical History:  Diagnosis Date  . Allergy   . Arthritis   . Blood transfusion   . Hyperlipidemia   . Hypertension     Past Surgical History:  Procedure Laterality Date  . ABDOMINAL HYSTERECTOMY    . APPENDECTOMY    . CESAREAN SECTION    . JOINT REPLACEMENT      There were no vitals filed for this visit.       Subjective Assessment - 01/14/16 1107    Subjective Describes vertigo as everything is moving at a fast pace (worse turning over in bed.)  Describes dizziness as unable to stay steady on feet.  Better with sitting and taking pill to let it pass  Does water excercise three times a week and chair yoga once per week.   Pertinent History Patient relates started having bout with vertigo back in March with nausea and dry heaves and trip to ER.  Feels like has exacerbated each 3 months.  Last time no nausea, but bad vertigo, then no vertigo but has dizziness.  Reports was worse lying down and had spinning turning over to the left side. [place] needing to wait to drive till settled down.    Diagnostic tests Had MRI to rule out stroke, etc.   Patient Stated Goals Hopes to be over the dizziness and control it better   Currently in Pain? No/denies            [facility] PT Assessment - 01/14/16 0001      Assessment   Medical Diagnosis Labyrinthine vertigo   Referring Provider hensel   Onset Date/Surgical Date 05/17/15   Hand Dominance Right   Next MD Visit next year   Prior Therapy none     Balance Screen   Has the patient fallen in the past 6 months No   Has the patient had a decrease in activity level because of a fear of falling?  No   Is the patient reluctant to leave their home because of a fear of falling?  No     Home Environment   Living Environment Private residence   Living Arrangements Spouse/significant other   Available Help at Discharge Family  spouse unable to drive   Type of [place] to enter   Entrance Stairs-Number of Steps 1-2   Entrance Stairs-Rails Can reach both   Home [employer] or work area in [employer] fast streamer - 2 wheels;Cane - single point;Bedside commode     Prior Function   Level of Independence Independent;Independent with community mobility without device   Vocation Retired   Leisure Works out with [employer] and chair yoga     Cognition   Overall Cognitive Status Within Functional  Limits for tasks assessed     Posture/Postural Control   Posture/Postural Control Postural limitations   Postural Limitations Rounded Shoulders;Forward head  scoliotic deformity     ROM / Strength   AROM / PROM / Strength Strength     Strength   Overall Strength Within functional limits for tasks performed     Transfers   Transfers Sit to Stand   Sit to Stand 6: Modified independent (Device/Increase time)   Comments able to transfer without use of hands, but if not thinking uses hands     Ambulation/Gait   Ambulation/Gait Yes   Ambulation/Gait Assistance 7: Independent   Ambulation Distance (Feet) 250 Feet   Assistive device None   Gait Pattern Step-through pattern;Decreased stride length;Trunk flexed   Ambulation Surface Level;Indoor   Stairs Yes   Stairs Assistance 6: Modified independent (Device/Increase  time)   Stair Management Technique Two rails;Alternating pattern;Forwards   Number of Stairs 4   Height of Stairs 6     Standardized Balance Assessment   Standardized Balance Assessment Berg Balance Test;Dynamic Gait Index     Berg Balance Test   Sit to Stand Able to stand without using hands and stabilize independently   Standing Unsupported Able to stand safely 2 minutes   Sitting with Back Unsupported but Feet Supported on Floor or Stool Able to sit safely and securely 2 minutes   Stand to Sit Sits safely with minimal use of hands   Transfers Able to transfer safely, minor use of hands   Standing Unsupported with Eyes Closed Able to stand 10 seconds safely   Standing Ubsupported with Feet Together Able to place feet together independently and stand 1 minute safely   From Standing, Reach Forward with Outstretched Arm Can reach forward >12 cm safely (5")   From Standing Position, Pick up Object from Floor Able to pick up shoe safely and easily   From Standing Position, Turn to Look Behind Over each Shoulder Looks behind from both sides and weight shifts well   Turn 360 Degrees Able to turn 360 degrees safely but slowly   Standing Unsupported, Alternately Place Feet on Step/Stool Able to stand independently and safely and complete 8 steps in 20 seconds   Standing Unsupported, One Foot in Front Able to plae foot ahead of the other independently and hold 30 seconds   Standing on One Leg Able to lift leg independently and hold equal to or more than 3 seconds   Total Score 50     Dynamic Gait Index   Level Surface Normal   Change in Gait Speed Mild Impairment   Gait with Horizontal Head Turns Normal   Gait with Vertical Head Turns Mild Impairment   Gait and Pivot Turn Normal   Step Over Obstacle Normal   Step Around Obstacles Normal   Steps Mild Impairment   Total Score 21            Vestibular Assessment - 01/14/16 0001      Symptom Behavior   Type of Dizziness "World moves"    Frequency of Dizziness every three months   Duration of Dizziness couple of days   Aggravating Factors Rolling to left;Forward bending   Relieving Factors Rest;Comments  taking a pill     Occulomotor Exam   Occulomotor Alignment Abnormal  R eye lower than L with saggy eyelid   Spontaneous Absent   Gaze-induced Left beating nystagmus with L gaze   Smooth Pursuits Intact   Saccades Intact  Vestibulo-Occular Reflex   VOR 1 Head Only (x 1 viewing) horizontal and vertical x 10 turns able to maintain target, some wooziness with vertical head movements   VOR to Slow Head Movement Positive right  difficulty with testing due to stiff neck   VOR Cancellation Normal     Auditory   Comments hearing intact to scratch test and equal bilateral     Other Tests   Comments --     Positional Testing   Sidelying Test Sidelying Right;Sidelying Left   Horizontal Canal Testing Horizontal Canal Right;Horizontal Canal Left     Sidelying Right   Sidelying Right Duration 40 sec   Sidelying Right Symptoms No nystagmus     Sidelying Left   Sidelying Left Duration 40 sec   Sidelying Left Symptoms No nystagmus     Horizontal Canal Right   Horizontal Canal Right Duration 30 sec   Horizontal Canal Right Symptoms Normal     Horizontal Canal Left   Horizontal Canal Left Duration 30 sec   Horizontal Canal Left Symptoms Normal                       PT Education - 01/14/16 1521    Education provided Yes   Education Details POC, vestibular hypofunction   Person(s) Educated Patient   Methods Explanation   Comprehension Verbalized understanding          PT Short Term Goals - 01/14/16 1531      PT SHORT TERM GOAL #1   Title = LTG           PT Long Term Goals - 01/14/16 1532      PT LONG TERM GOAL #1   Title Patient will demonstrate HEP for balance and vestibular adapation independent   Baseline target 02/27/16   Time 4   Period Weeks   Status New     PT LONG  TERM GOAL #2   Title Patient will ambulate uneven outdoor surfaces independent without LOB   Time 4   Period Weeks   Status New     PT LONG TERM GOAL #3   Title Patient to complete Sensory Organizaiton Testing to improve vestibular use for balance   Time 4   Period Weeks   Status New     PT LONG TERM GOAL #4   Title Patient to report understanding of options for treatment for positional vertigo.   Time 4   Period Weeks   Status New               Plan - 01/14/16 1524    Clinical Impression Statement Patient presents with decreased mobility and safety at times when experiencing vertigo exacerbation.  Symptoms indicate potential R vestibular hypofunction.  Reports has had about 3 times since March with worsening symptoms each time.  Feels unable to manage on her own and will benefit from skilled PT to address limitations and educate on how to progress with activities to improve symptoms more efficiently and quickly and handle any secondary symptoms of positional vertigo.   Rehab Potential Excellent   PT Frequency 2x / week   PT Duration 4 weeks   PT Treatment/Interventions ADLs/Self Care Home Management;Gait training;Stair training;Neuromuscular re-education;Functional mobility training;Therapeutic activities;Therapeutic exercise;Patient/family education;Balance training   PT Next Visit Plan SOT, initiate adaptation   Consulted and Agree with Plan of Care Patient      Patient will benefit from skilled therapeutic intervention in order to improve the following deficits  and impairments:  Decreased mobility, Decreased activity tolerance, Impaired perceived functional ability, Decreased balance (dizziness)  Visit Diagnosis: Dizziness and giddiness - Plan: PT plan of care cert/re-cert  Other abnormalities of gait and mobility - Plan: PT plan of care cert/re-cert      G-Codes - [id number]    Functional Assessment Tool Used Berg 50, DGI 21, vertigo exacerbations every 3  months   Functional Limitation Mobility: Walking and moving around   Mobility: Walking and Moving Around Current Status [id number]) At least 1 percent but less than 20 percent impaired, limited or restricted   Mobility: Walking and Moving Around Goal Status [phone]) 0 percent impaired, limited or restricted       Problem List Patient Active Problem List   Diagnosis Date Noted  . Vertigo, labyrinthine 05/15/2015  . Hyponatremia 05/08/2015  . Solitary pulmonary nodule 05/08/2015  . Routine general medical examination at a health care facility 11/22/2013  . Vitamin D deficiency 11/17/2012  . Degenerative joint disease of cervical spine 10/20/2011  . Shoulder pain 10/20/2011  . Hypertension 05/13/2010  . Hypercholesteremia 05/13/2010  . Restless leg syndrome 05/13/2010  . Osteoporosis 05/13/2010    [name] 01/14/2016, 3:40 PM  [name], PT [phone] 01/14/2016   [facility] [address] [place], [place], [address] Phone: [phone]   Fax:  [phone]  Name: Gina Bishop MRN: [id number] Date of Birth: 01-05-1932

## 2016-01-21 ENCOUNTER — Ambulatory Visit: Payer: Medicare Other | Attending: Family Medicine | Admitting: *Deleted

## 2016-01-21 DIAGNOSIS — R42 Dizziness and giddiness: Secondary | ICD-10-CM | POA: Diagnosis not present

## 2016-01-21 DIAGNOSIS — R2689 Other abnormalities of gait and mobility: Secondary | ICD-10-CM | POA: Diagnosis not present

## 2016-01-21 NOTE — Therapy (Signed)
[facility] [address] [place], [place], [address] Phone: [phone]   Fax:  [phone]  Physical Therapy Treatment  Patient Details  Name: Gina Bishop MRN: [id number] Date of Birth: 09-May-1931 Referring Provider: Andria Frames  Encounter Date: 01/21/2016      PT End of Session - 01/21/16 1200    Visit Number 2   Number of Visits 9   Date for PT Re-Evaluation 02/27/16   PT Start Time 1106   PT Stop Time 1147   PT Time Calculation (min) 41 min   Activity Tolerance Patient tolerated treatment well   Behavior During Therapy [facility] for tasks assessed/performed      Past Medical History:  Diagnosis Date  . Allergy   . Arthritis   . Blood transfusion   . Hyperlipidemia   . Hypertension     Past Surgical History:  Procedure Laterality Date  . ABDOMINAL HYSTERECTOMY    . APPENDECTOMY    . CESAREAN SECTION    . JOINT REPLACEMENT      There were no vitals filed for this visit.      Subjective Assessment - 01/21/16 1126    Subjective Denies dizziness today.    Pertinent History Patient relates started having bout with vertigo back in March with nausea and dry heaves and trip to ER.  Feels like has exacerbated each 3 months.  Last time no nausea, but bad vertigo, then no vertigo but has dizziness.  Reports was worse lying down and had spinning turning over to the left side. [place] the [place] due to needing to wait to drive till settled down.    Diagnostic tests Had MRI to rule out stroke, etc.   Patient Stated Goals Hopes to be over the dizziness and control it better   Currently in Pain? No/denies                         [facility] Adult PT Treatment/Exercise - 01/21/16 1127      Balance   Balance Assessed Yes     Static Standing Balance   Static Standing - Balance Support No upper extremity supported   Static Standing - Level of Assistance 5: Stand by assistance   Tandem Stance  - Right Leg 30  10 head turns; then 10 head nods   Tandem Stance - Left Leg 30  10 head turns, 10 head nods   Rhomberg - Eyes Closed 30  standing feet together on compliant surface     Standardized Balance Assessment   Standardized Balance Assessment Balance Master Testing  SOT Composite score 39 (39-79 age norm 65+)     Self-Care   Self-Care Other Self-Care Comments   Other Self-Care Comments  educated in SOT score/norms/sensory use for balance                PT Education - 01/21/16 1200    Education provided Yes   Education Details HEP for balance; SOT score interpretation   Person(s) Educated Patient   Methods Explanation;Demonstration   Comprehension Verbalized understanding;Need further instruction          PT Short Term Goals - 01/14/16 1531      PT SHORT TERM GOAL #1   Title = LTG           PT Long Term Goals - 01/14/16 1532      PT LONG TERM GOAL #1   Title Patient will demonstrate  HEP for balance and vestibular adapation independent   Baseline target 02/27/16   Time 4   Period Weeks   Status New     PT LONG TERM GOAL #2   Title Patient will ambulate uneven outdoor surfaces independent without LOB   Time 4   Period Weeks   Status New     PT LONG TERM GOAL #3   Title Patient to complete Sensory Organizaiton Testing to improve vestibular use for balance   Time 4   Period Weeks   Status New     PT LONG TERM GOAL #4   Title Patient to report understanding of options for treatment for positional vertigo.   Time 4   Period Weeks   Status New               Plan - 01/21/16 1201    Clinical Impression Statement Patient demonstrate significant balance issues when vestibular use needed.  Unable to complete any of the trials on condition 5&6.  Initiated HEP today for improved vestibular use.  Still not reporting dizziness even with testing.   Safe with practicing balance in corner with chair back in front.  Continue skilled PT to progress  to goals.    PT Frequency 2x / week   PT Duration 4 weeks   PT Treatment/Interventions ADLs/Self Care Home Management;Gait training;Stair training;Neuromuscular re-education;Functional mobility training;Therapeutic activities;Therapeutic exercise;Patient/family education;Balance training   PT Next Visit Plan Initiate adaptation, check again for positional vertigo possibly initiate [place] and Agree with Plan of Care Patient      Patient will benefit from skilled therapeutic intervention in order to improve the following deficits and impairments:  Decreased mobility, Decreased activity tolerance, Impaired perceived functional ability, Decreased balance (dizziness)  Visit Diagnosis: Dizziness and giddiness  Other abnormalities of gait and mobility     Problem List Patient Active Problem List   Diagnosis Date Noted  . Vertigo, labyrinthine 05/15/2015  . Hyponatremia 05/08/2015  . Solitary pulmonary nodule 05/08/2015  . Routine general medical examination at a health care facility 11/22/2013  . Vitamin D deficiency 11/17/2012  . Degenerative joint disease of cervical spine 10/20/2011  . Shoulder pain 10/20/2011  . Hypertension 05/13/2010  . Hypercholesteremia 05/13/2010  . Restless leg syndrome 05/13/2010  . Osteoporosis 05/13/2010    [name] 01/21/2016, 12:10 PM  [name], [place] 01/21/2016  [facility] [address] [place], [place], [address] Phone: [phone]   Fax:  [phone]  Name: Gina Bishop MRN: [id number] Date of Birth: Aug 25, 1931

## 2016-01-21 NOTE — Patient Instructions (Signed)
Feet Together (Compliant Surface) Varied Arm Positions - Eyes Closed    Stand on compliant surface: ____thick pillow or couch cushion____ with feet together and arms out. Close eyes and visualize upright position. Hold_30___ seconds. Repeat __3__ times per session. Do _1Feet Partial Heel-Toe, Head Motion - Eyes Open    With eyes open, right foot partially in front of the other, move head slowly: up and down. Repeat ___10_ times per session. Do _1___ sessions per day.  Repeat with L foot in front. Repeat with head side to side   Copyright  VHI. All rights reserved.

## 2016-01-26 ENCOUNTER — Ambulatory Visit: Payer: Medicare Other | Admitting: *Deleted

## 2016-01-26 DIAGNOSIS — R2689 Other abnormalities of gait and mobility: Secondary | ICD-10-CM

## 2016-01-26 DIAGNOSIS — R42 Dizziness and giddiness: Secondary | ICD-10-CM | POA: Diagnosis not present

## 2016-01-26 NOTE — Therapy (Signed)
[facility] [address] [place], [place], [address] Phone: [phone]   Fax:  [phone]  Physical Therapy Treatment  Patient Details  Name: Gina Bishop MRN: [id number] Date of Birth: 12-18-1931 Referring Provider: Andria Frames  Encounter Date: 01/26/2016      PT End of Session - 01/26/16 1649    Visit Number 3   Number of Visits 9   Date for PT Re-Evaluation 02/27/16   PT Start Time 1400   PT Stop Time 1443   PT Time Calculation (min) 43 min   Activity Tolerance Patient tolerated treatment well   Behavior During Therapy [facility] for tasks assessed/performed      Past Medical History:  Diagnosis Date  . Allergy   . Arthritis   . Blood transfusion   . Hyperlipidemia   . Hypertension     Past Surgical History:  Procedure Laterality Date  . ABDOMINAL HYSTERECTOMY    . APPENDECTOMY    . CESAREAN SECTION    . JOINT REPLACEMENT      There were no vitals filed for this visit.      Subjective Assessment - 01/26/16 1403    Subjective no trouble with HEP, no dizziness   Currently in Pain? No/denies                         [facility] Adult PT Treatment/Exercise - 01/26/16 1431      High Level Balance   High Level Balance Comments standing in corner on two pillows eyes closed 30 sec x 3 reps, semi tandem head turns x 10 each way, then nods x 10 each way.     Neuro Re-ed    Neuro Re-ed Details  standing feet together eyes on letter 5' away head turns and nods x 30 sec each, no dizziness     Exercises   Exercises Other Exercises   Other Exercises  Nestor Lewandowsky at edge of mat x 2 reps with handout; caution against self treatment if concen of syncope, any dizziness different that positional (i.e. stroke or medication related)                PT Education - 01/26/16 1649    Education provided Yes   Education Details HEP; Nestor Lewandowsky for reference if BPPV returns   Person(s) Educated  Patient   Methods Explanation;Demonstration;Handout   Comprehension Verbalized understanding          PT Short Term Goals - 01/14/16 1531      PT SHORT TERM GOAL #1   Title = LTG           PT Long Term Goals - 01/26/16 1651      PT LONG TERM GOAL #3   Title Patient to complete Sensory Organizaiton Testing to improve vestibular use for balance   Time 4   Period Weeks   Status Achieved     PT LONG TERM GOAL #4   Title Patient to report understanding of options for treatment for positional vertigo.   Time 4   Period Weeks   Status Achieved     PT LONG TERM GOAL #5   Title Patient to demonstrate improved vestibular use for balance on SOT with 20% improvement   Time 3   Period Weeks   Status New               Plan - 01/26/16 1650    Clinical Impression Statement Patient demonstrates no dizziness  during normal daily activities at this time.  Able to follow instructions on handout for Nestor Lewandowsky if positional vertigo returns.  Skilled PT indicated, however, to continue activities to improve vestibular use for balance prior to d/c.    Rehab Potential Excellent   PT Frequency 2x / week   PT Duration 4 weeks   PT Treatment/Interventions ADLs/Self Care Home Management;Gait training;Stair training;Neuromuscular re-education;Functional mobility training;Therapeutic activities;Therapeutic exercise;Patient/family education;Balance training   PT Next Visit Plan Balance activities to improve vestibular use and dynamic gait   Consulted and Agree with Plan of Care Patient      Patient will benefit from skilled therapeutic intervention in order to improve the following deficits and impairments:  Decreased mobility, Decreased activity tolerance, Impaired perceived functional ability, Decreased balance  Visit Diagnosis: Dizziness and giddiness  Other abnormalities of gait and mobility     Problem List Patient Active Problem List   Diagnosis Date Noted  . Vertigo,  labyrinthine 05/15/2015  . Hyponatremia 05/08/2015  . Solitary pulmonary nodule 05/08/2015  . Routine general medical examination at a health care facility 11/22/2013  . Vitamin D deficiency 11/17/2012  . Degenerative joint disease of cervical spine 10/20/2011  . Shoulder pain 10/20/2011  . Hypertension 05/13/2010  . Hypercholesteremia 05/13/2010  . Restless leg syndrome 05/13/2010  . Osteoporosis 05/13/2010    [name] 01/26/2016, 4:55 PM  [name], [place] 01/26/2016   [name] [address] [place], [place], [address] Phone: [phone]   Fax:  [phone]  Name: Gina Bishop MRN: [id number] Date of Birth: 30-Sep-1931

## 2016-01-26 NOTE — Patient Instructions (Addendum)
Sit to Side-Lying    Sit on edge of bed. 1. Turn head 45 to right. 2. Maintain head position and lie down slowly on left side. Hold until symptoms subside. 3. Sit up slowly. Hold until symptoms subside. 4. Turn head 45 to left. 5. Maintain head position and lie down slowly on right side. Hold until symptoms subside. 6. Sit up slowly. Repeat sequence __3__ times per session. Do _3___ sessions per day.  Copyright  VHI. All rights reserved.

## 2016-01-28 ENCOUNTER — Other Ambulatory Visit: Payer: Self-pay | Admitting: *Deleted

## 2016-01-28 ENCOUNTER — Ambulatory Visit: Payer: Medicare Other | Admitting: Physical Therapy

## 2016-01-28 DIAGNOSIS — R2689 Other abnormalities of gait and mobility: Secondary | ICD-10-CM

## 2016-01-28 DIAGNOSIS — M47812 Spondylosis without myelopathy or radiculopathy, cervical region: Secondary | ICD-10-CM

## 2016-01-28 DIAGNOSIS — R42 Dizziness and giddiness: Secondary | ICD-10-CM

## 2016-01-28 MED ORDER — MELOXICAM 7.5 MG PO TABS: 7.5000 mg | ORAL_TABLET | Freq: Every day | ORAL | 3 refills | Status: DC

## 2016-01-28 NOTE — Therapy (Signed)
[facility] [address] [place], [place], [address] Phone: [phone]   Fax:  [phone]  Physical Therapy Treatment  Patient Details  Name: Gina Bishop MRN: [id number] Date of Birth: December 16, 1931 Referring Provider: Andria Frames  Encounter Date: 01/28/2016      PT End of Session - 01/28/16 1134    Visit Number 4   PT Start Time 1100  d/c   PT Stop Time 1129   PT Time Calculation (min) 29 min   Activity Tolerance Patient tolerated treatment well   Behavior During Therapy [facility] for tasks assessed/performed      Past Medical History:  Diagnosis Date  . Allergy   . Arthritis   . Blood transfusion   . Hyperlipidemia   . Hypertension     Past Surgical History:  Procedure Laterality Date  . ABDOMINAL HYSTERECTOMY    . APPENDECTOMY    . CESAREAN SECTION    . JOINT REPLACEMENT      There were no vitals filed for this visit.      Subjective Assessment - 01/28/16 1100    Subjective doing her homework (not as much as she is supposed to), but did it twice yesterday. feeling pretty good.  no dizziness   Pertinent History Patient relates started having bout with vertigo back in March with nausea and dry heaves and trip to ER.  Feels like has exacerbated each 3 months.  Last time no nausea, but bad vertigo, then no vertigo but has dizziness.  Reports was worse lying down and had spinning turning over to the left side. [place] needing to wait to drive till settled down.    Patient Stated Goals Hopes to be over the dizziness and control it better   Currently in Pain? No/denies             Reviewed HEP-pt independent  Neuro re-ed: sensory organization test performed with following results: Conditions:  1:  Above avg 2:  Above avg 3:  Above avg 4: above avg 5:  Above avg 6: fall 1 and 2; above avg 3 Composite score:  72 (avg ~ 68)  Sensory Analysis Som: above avg DTO:IZTIW  avg Vest:above avg Pref:at avg                          PT Education - 01/28/16 1133    Education provided Yes   Education Details cont HEP until exercises no longer challenging or no symptoms; if symptoms return to call office   Person(s) Educated Patient   Methods Explanation   Comprehension Verbalized understanding          PT Short Term Goals - 01/14/16 1531      PT SHORT TERM GOAL #1   Title = LTG           PT Long Term Goals - 01/28/16 1134      PT LONG TERM GOAL #1   Title Patient will demonstrate HEP for balance and vestibular adapation independent   Status Achieved     PT LONG TERM GOAL #2   Title Patient will ambulate uneven outdoor surfaces independent without LOB   Baseline not tested but pt reports she has been performing at home without difficulty   Status Achieved     PT LONG TERM GOAL #3   Title Patient to complete Sensory Organizaiton Testing to improve vestibular use for balance  Status Achieved     PT LONG TERM GOAL #4   Title Patient to report understanding of options for treatment for positional vertigo.   Status Achieved     PT LONG TERM GOAL #5   Title Patient to demonstrate improved vestibular use for balance on SOT with 20% improvement   Status Achieved               Plan - Feb 25, 2016 1137    Clinical Impression Statement Pt has met all goals and reports she is back to baseline.  Ready for d/c.   PT Next Visit Plan d/c PT   Consulted and Agree with Plan of Care Patient      Patient will benefit from skilled therapeutic intervention in order to improve the following deficits and impairments:  Decreased mobility, Decreased activity tolerance, Impaired perceived functional ability, Decreased balance  Visit Diagnosis: Dizziness and giddiness  Other abnormalities of gait and mobility       G-Codes - 02/25/2016 1137    Functional Assessment Tool Used SOT WNL; pt returned to baseline   Functional Limitation  Mobility: Walking and moving around   Mobility: Walking and Moving Around Goal Status [phone]) 0 percent impaired, limited or restricted   Mobility: Walking and Moving Around Discharge Status [phone]) 0 percent impaired, limited or restricted      Problem List Patient Active Problem List   Diagnosis Date Noted  . Vertigo, labyrinthine 05/15/2015  . Hyponatremia 05/08/2015  . Solitary pulmonary nodule 05/08/2015  . Routine general medical examination at a health care facility 11/22/2013  . Vitamin D deficiency 11/17/2012  . Degenerative joint disease of cervical spine 10/20/2011  . Shoulder pain 10/20/2011  . Hypertension 05/13/2010  . Hypercholesteremia 05/13/2010  . Restless leg syndrome 05/13/2010  . Osteoporosis 05/13/2010       Laureen Abrahams, PT, DPT 02-25-16 11:40 AM    [facility] [address] [place], [place], [address] Phone: [phone]   Fax:  [phone]  Name: Carlei Huang MRN: [id number] Date of Birth: May 29, 1931     PHYSICAL THERAPY DISCHARGE SUMMARY  Visits from Start of Care: 4  Current functional level related to goals / functional outcomes: See above   Remaining deficits: N/a; pt back to baseline   Education / Equipment: HEP  Plan: Patient agrees to discharge.  Patient goals were met. Patient is being discharged due to meeting the stated rehab goals.  ?????    Laureen Abrahams, PT, DPT 02/25/2016 11:41 AM  [facility] [address] [place], [place] [address]  [phone] (office) [phone] (fax)

## 2016-02-02 ENCOUNTER — Encounter: Payer: Medicare Other | Admitting: *Deleted

## 2016-02-04 ENCOUNTER — Encounter: Payer: Medicare Other | Admitting: *Deleted

## 2016-02-18 ENCOUNTER — Encounter: Payer: Medicare Other | Admitting: Physical Therapy

## 2016-02-19 ENCOUNTER — Encounter: Payer: Medicare Other | Admitting: Physical Therapy

## 2016-04-29 ENCOUNTER — Other Ambulatory Visit: Payer: Self-pay | Admitting: Family Medicine

## 2016-04-29 DIAGNOSIS — M810 Age-related osteoporosis without current pathological fracture: Secondary | ICD-10-CM

## 2016-06-23 ENCOUNTER — Other Ambulatory Visit: Payer: Self-pay | Admitting: Family Medicine

## 2016-06-23 DIAGNOSIS — I10 Essential (primary) hypertension: Secondary | ICD-10-CM

## 2016-06-30 DIAGNOSIS — Z1231 Encounter for screening mammogram for malignant neoplasm of breast: Secondary | ICD-10-CM | POA: Diagnosis not present

## 2016-07-01 ENCOUNTER — Other Ambulatory Visit: Payer: Self-pay | Admitting: Family Medicine

## 2016-07-01 MED ORDER — DICYCLOMINE HCL 10 MG PO CAPS: 10.0000 mg | ORAL_CAPSULE | Freq: Three times a day (TID) | ORAL | 6 refills | Status: DC | PRN

## 2016-07-15 DIAGNOSIS — H401131 Primary open-angle glaucoma, bilateral, mild stage: Secondary | ICD-10-CM | POA: Diagnosis not present

## 2016-07-15 DIAGNOSIS — H43813 Vitreous degeneration, bilateral: Secondary | ICD-10-CM | POA: Diagnosis not present

## 2016-07-15 DIAGNOSIS — H04123 Dry eye syndrome of bilateral lacrimal glands: Secondary | ICD-10-CM | POA: Diagnosis not present

## 2016-07-15 DIAGNOSIS — H5213 Myopia, bilateral: Secondary | ICD-10-CM | POA: Diagnosis not present

## 2016-07-15 DIAGNOSIS — Z961 Presence of intraocular lens: Secondary | ICD-10-CM | POA: Diagnosis not present

## 2016-07-15 DIAGNOSIS — H52223 Regular astigmatism, bilateral: Secondary | ICD-10-CM | POA: Diagnosis not present

## 2016-08-20 ENCOUNTER — Telehealth: Payer: Self-pay

## 2016-08-20 NOTE — Telephone Encounter (Signed)
Pt calling to request refill of: Mirapex  Name of Medication(s):  Last date of OV: 11/05/2015 Pharmacy:  [facility], [place] - [address] AT [place]  Will route refill request to Clinic RN.  Discussed with patient policy to call pharmacy for future refills.  Also, discussed refills may take up to 48 hours to approve or deny.  Ottis Stain

## 2016-08-23 MED ORDER — PRAMIPEXOLE DIHYDROCHLORIDE 0.5 MG PO TABS: 0.5000 mg | ORAL_TABLET | Freq: Every day | ORAL | 3 refills | Status: DC

## 2016-08-23 NOTE — Addendum Note (Signed)
Addended by: [name] on: 08/23/2016 11:24 AM   Modules accepted: Orders

## 2016-08-23 NOTE — Telephone Encounter (Addendum)
Called to verify dose.  I will need to change either the sig or the quantity.  Talked to patient and verified that she is taking one qhs.

## 2016-11-02 ENCOUNTER — Ambulatory Visit (INDEPENDENT_AMBULATORY_CARE_PROVIDER_SITE_OTHER): Payer: Medicare Other | Admitting: *Deleted

## 2016-11-02 ENCOUNTER — Encounter: Payer: Self-pay | Admitting: *Deleted

## 2016-11-02 ENCOUNTER — Encounter: Payer: Self-pay | Admitting: Licensed Clinical Social Worker

## 2016-11-02 VITALS — BP 140/60 | HR 70 | Temp 98.5°F | Ht 65.0 in | Wt 162.6 lb

## 2016-11-02 DIAGNOSIS — Z0000 Encounter for general adult medical examination without abnormal findings: Secondary | ICD-10-CM | POA: Diagnosis not present

## 2016-11-02 DIAGNOSIS — Z1211 Encounter for screening for malignant neoplasm of colon: Secondary | ICD-10-CM

## 2016-11-02 NOTE — Patient Instructions (Addendum)
Gina Bishop,  Thank you for taking time to come for yourMedicare Wellness Visit. I appreciate your ongoing commitment to your health goals. Please review the following plan we discussed and let me know if I can assist you in the future.   These are the goals we discussed:  Goals    . Blood Pressure < 140/90    . Increase water intake    . Maintain current level of physical activity          Aquacise and chair yoga 3-4 X per week 45 minutes per time       Fall Prevention in the Home Falls can cause injuries. They can happen to people of all ages. There are many things you can do to make your home safe and to help prevent falls. What can I do on the outside of my home?  Regularly fix the edges of walkways and driveways and fix any cracks.  Remove anything that might make you trip as you walk through a door, such as a raised step or threshold.  Trim any bushes or trees on the path to your home.  Use bright outdoor lighting.  Clear any walking paths of anything that might make someone trip, such as rocks or tools.  Regularly check to see if handrails are loose or broken. Make sure that both sides of any steps have handrails.  Any raised decks and porches should have guardrails on the edges.  Have any leaves, snow, or ice cleared regularly.  Use sand or salt on walking paths during winter.  Clean up any spills in your garage right away. This includes oil or grease spills. What can I do in the bathroom?  Use night lights.  Install grab bars by the toilet and in the tub and shower. Do not use towel bars as grab bars.  Use non-skid mats or decals in the tub or shower.  If you need to sit down in the shower, use a plastic, non-slip stool.  Keep the floor dry. Clean up any water that spills on the floor as soon as it happens.  Remove soap buildup in the tub or shower regularly.  Attach bath mats securely with double-sided non-slip rug tape.  Do not have throw rugs and  other things on the floor that can make you trip. What can I do in the bedroom?  Use night lights.  Make sure that you have a light by your bed that is easy to reach.  Do not use any sheets or blankets that are too big for your bed. They should not hang down onto the floor.  Have a firm chair that has side arms. You can use this for support while you get dressed.  Do not have throw rugs and other things on the floor that can make you trip. What can I do in the kitchen?  Clean up any spills right away.  Avoid walking on wet floors.  Keep items that you use a lot in easy-to-reach places.  If you need to reach something above you, use a strong step stool that has a grab bar.  Keep electrical cords out of the way.  Do not use floor polish or wax that makes floors slippery. If you must use wax, use non-skid floor wax.  Do not have throw rugs and other things on the floor that can make you trip. What can I do with my stairs?  Do not leave any items on the stairs.  Make sure  that there are handrails on both sides of the stairs and use them. Fix handrails that are broken or loose. Make sure that handrails are as long as the stairways.  Check any carpeting to make sure that it is firmly attached to the stairs. Fix any carpet that is loose or worn.  Avoid having throw rugs at the top or bottom of the stairs. If you do have throw rugs, attach them to the floor with carpet tape.  Make sure that you have a light switch at the top of the stairs and the bottom of the stairs. If you do not have them, ask someone to add them for you. What else can I do to help prevent falls?  Wear shoes that: ? Do not have high heels. ? Have rubber bottoms. ? Are comfortable and fit you well. ? Are closed at the toe. Do not wear sandals.  If you use a stepladder: ? Make sure that it is fully opened. Do not climb a closed stepladder. ? Make sure that both sides of the stepladder are locked into  place. ? Ask someone to hold it for you, if possible.  Clearly mark and make sure that you can see: ? Any grab bars or handrails. ? First and last steps. ? Where the edge of each step is.  Use tools that help you move around (mobility aids) if they are needed. These include: ? Canes. ? Walkers. ? Scooters. ? Crutches.  Turn on the lights when you go into a dark area. Replace any light bulbs as soon as they burn out.  Set up your furniture so you have a clear path. Avoid moving your furniture around.  If any of your floors are uneven, fix them.  If there are any pets around you, be aware of where they are.  Review your medicines with your doctor. Some medicines can make you feel dizzy. This can increase your chance of falling. Ask your doctor what other things that you can do to help prevent falls. This information is not intended to replace advice given to you by your health care provider. Make sure you discuss any questions you have with your health care provider. Document Released: 11/28/2008 Document Revised: 07/10/2015 Document Reviewed: 03/08/2014 Elsevier Interactive Patient Education  2018 [place] Maintenance, Female Adopting a healthy lifestyle and getting preventive care can go a long way to promote health and wellness. Talk with your health care provider about what schedule of regular examinations is right for you. This is a good chance for you to check in with your provider about disease prevention and staying healthy. In between checkups, there are plenty of things you can do on your own. Experts have done a lot of research about which lifestyle changes and preventive measures are most likely to keep you healthy. Ask your health care provider for more information. Weight and diet Eat a healthy diet  Be sure to include plenty of vegetables, fruits, low-fat dairy products, and lean protein.  Do not eat a lot of foods high in solid fats, added sugars, or  salt.  Get regular exercise. This is one of the most important things you can do for your health. ? Most adults should exercise for at least 150 minutes each week. The exercise should increase your heart rate and make you sweat (moderate-intensity exercise). ? Most adults should also do strengthening exercises at least twice a week. This is in addition to the moderate-intensity exercise.  Maintain a  healthy weight  Body mass index (BMI) is a measurement that can be used to identify possible weight problems. It estimates body fat based on height and weight. Your health care provider can help determine your BMI and help you achieve or maintain a healthy weight.  For females 51 years of age and older: ? A BMI below 18.5 is considered underweight. ? A BMI of 18.5 to 24.9 is normal. ? A BMI of 25 to 29.9 is considered overweight. ? A BMI of 30 and above is considered obese.  Watch levels of cholesterol and blood lipids  You should start having your blood tested for lipids and cholesterol at 81 years of age, then have this test every 5 years.  You may need to have your cholesterol levels checked more often if: ? Your lipid or cholesterol levels are high. ? You are older than 81 years of age. ? You are at high risk for heart disease.  Cancer screening Lung Cancer  Lung cancer screening is recommended for adults 47-32 years old who are at high risk for lung cancer because of a history of smoking.  A yearly low-dose CT scan of the lungs is recommended for people who: ? Currently smoke. ? Have quit within the past 15 years. ? Have at least a 30-pack-year history of smoking. A pack year is smoking an average of one pack of cigarettes a day for 1 year.  Yearly screening should continue until it has been 15 years since you quit.  Yearly screening should stop if you develop a health problem that would prevent you from having lung cancer treatment.  Breast Cancer  Practice breast  self-awareness. This means understanding how your breasts normally appear and feel.  It also means doing regular breast self-exams. Let your health care provider know about any changes, no matter how small.  If you are in your 20s or 30s, you should have a clinical breast exam (CBE) by a health care provider every 1-3 years as part of a regular health exam.  If you are 22 or older, have a CBE every year. Also consider having a breast X-ray (mammogram) every year.  If you have a family history of breast cancer, talk to your health care provider about genetic screening.  If you are at high risk for breast cancer, talk to your health care provider about having an MRI and a mammogram every year.  Breast cancer gene (BRCA) assessment is recommended for women who have family members with BRCA-related cancers. BRCA-related cancers include: ? Breast. ? Ovarian. ? Tubal. ? Peritoneal cancers.  Results of the assessment will determine the need for genetic counseling and BRCA1 and BRCA2 testing.  Cervical Cancer Your health care provider may recommend that you be screened regularly for cancer of the pelvic organs (ovaries, uterus, and vagina). This screening involves a pelvic examination, including checking for microscopic changes to the surface of your cervix (Pap test). You may be encouraged to have this screening done every 3 years, beginning at age 47.  For women ages 55-65, health care providers may recommend pelvic exams and Pap testing every 3 years, or they may recommend the Pap and pelvic exam, combined with testing for human papilloma virus (HPV), every 5 years. Some types of HPV increase your risk of cervical cancer. Testing for HPV may also be done on women of any age with unclear Pap test results.  Other health care providers may not recommend any screening for nonpregnant women who are considered  low risk for pelvic cancer and who do not have symptoms. Ask your health care provider if a  screening pelvic exam is right for you.  If you have had past treatment for cervical cancer or a condition that could lead to cancer, you need Pap tests and screening for cancer for at least 20 years after your treatment. If Pap tests have been discontinued, your risk factors (such as having a new sexual partner) need to be reassessed to determine if screening should resume. Some women have medical problems that increase the chance of getting cervical cancer. In these cases, your health care provider may recommend more frequent screening and Pap tests.  Colorectal Cancer  This type of cancer can be detected and often prevented.  Routine colorectal cancer screening usually begins at 81 years of age and continues through 81 years of age.  Your health care provider may recommend screening at an earlier age if you have risk factors for colon cancer.  Your health care provider may also recommend using home test kits to check for hidden blood in the stool.  A small camera at the end of a tube can be used to examine your colon directly (sigmoidoscopy or colonoscopy). This is done to check for the earliest forms of colorectal cancer.  Routine screening usually begins at age 28.  Direct examination of the colon should be repeated every 5-10 years through 81 years of age. However, you may need to be screened more often if early forms of precancerous polyps or small growths are found.  Skin Cancer  Check your skin from head to toe regularly.  Tell your health care provider about any new moles or changes in moles, especially if there is a change in a mole's shape or color.  Also tell your health care provider if you have a mole that is larger than the size of a pencil eraser.  Always use sunscreen. Apply sunscreen liberally and repeatedly throughout the day.  Protect yourself by wearing long sleeves, pants, a wide-brimmed hat, and sunglasses whenever you are outside.  Heart disease, diabetes, and  high blood pressure  High blood pressure causes heart disease and increases the risk of stroke. High blood pressure is more likely to develop in: ? People who have blood pressure in the high end of the normal range (130-139/85-89 mm Hg). ? People who are overweight or obese. ? People who are African American.  If you are 56-64 years of age, have your blood pressure checked every 3-5 years. If you are 68 years of age or older, have your blood pressure checked every year. You should have your blood pressure measured twice-once when you are at a hospital or clinic, and once when you are not at a hospital or clinic. Record the average of the two measurements. To check your blood pressure when you are not at a hospital or clinic, you can use: ? An automated blood pressure machine at a pharmacy. ? A home blood pressure monitor.  If you are between 74 years and 68 years old, ask your health care provider if you should take aspirin to prevent strokes.  Have regular diabetes screenings. This involves taking a blood sample to check your fasting blood sugar level. ? If you are at a normal weight and have a low risk for diabetes, have this test once every three years after 81 years of age. ? If you are overweight and have a high risk for diabetes, consider being tested at a younger  age or more often. Preventing infection Hepatitis B  If you have a higher risk for hepatitis B, you should be screened for this virus. You are considered at high risk for hepatitis B if: ? You were born in a country where hepatitis B is common. Ask your health care provider which countries are considered high risk. ? Your parents were born in a high-risk country, and you have not been immunized against hepatitis B (hepatitis B vaccine). ? You have HIV or AIDS. ? You use needles to inject street drugs. ? You live with someone who has hepatitis B. ? You have had sex with someone who has hepatitis B. ? You get hemodialysis  treatment. ? You take certain medicines for conditions, including cancer, organ transplantation, and autoimmune conditions.  Hepatitis C  Blood testing is recommended for: ? Everyone born from [birth year] through 1963. ? Anyone with known risk factors for hepatitis C.  Sexually transmitted infections (STIs)  You should be screened for sexually transmitted infections (STIs) including gonorrhea and chlamydia if: ? You are sexually active and are younger than 81 years of age. ? You are older than 81 years of age and your health care provider tells you that you are at risk for this type of infection. ? Your sexual activity has changed since you were last screened and you are at an increased risk for chlamydia or gonorrhea. Ask your health care provider if you are at risk.  If you do not have HIV, but are at risk, it may be recommended that you take a prescription medicine daily to prevent HIV infection. This is called pre-exposure prophylaxis (PrEP). You are considered at risk if: ? You are sexually active and do not regularly use condoms or know the HIV status of your partner(s). ? You take drugs by injection. ? You are sexually active with a partner who has HIV.  Talk with your health care provider about whether you are at high risk of being infected with HIV. If you choose to begin PrEP, you should first be tested for HIV. You should then be tested every 3 months for as long as you are taking PrEP. Pregnancy  If you are premenopausal and you may become pregnant, ask your health care provider about preconception counseling.  If you may become pregnant, take 400 to 800 micrograms (mcg) of folic acid every day.  If you want to prevent pregnancy, talk to your health care provider about birth control (contraception). Osteoporosis and menopause  Osteoporosis is a disease in which the bones lose minerals and strength with aging. This can result in serious bone fractures. Your risk for osteoporosis  can be identified using a bone density scan.  If you are 7 years of age or older, or if you are at risk for osteoporosis and fractures, ask your health care provider if you should be screened.  Ask your health care provider whether you should take a calcium or vitamin D supplement to lower your risk for osteoporosis.  Menopause may have certain physical symptoms and risks.  Hormone replacement therapy may reduce some of these symptoms and risks. Talk to your health care provider about whether hormone replacement therapy is right for you. Follow these instructions at home:  Schedule regular health, dental, and eye exams.  Stay current with your immunizations.  Do not use any tobacco products including cigarettes, chewing tobacco, or electronic cigarettes.  If you are pregnant, do not drink alcohol.  If you are breastfeeding, limit how much  and how often you drink alcohol.  Limit alcohol intake to no more than 1 drink per day for nonpregnant women. One drink equals 12 ounces of beer, 5 ounces of wine, or 1 ounces of hard liquor.  Do not use street drugs.  Do not share needles.  Ask your health care provider for help if you need support or information about quitting drugs.  Tell your health care provider if you often feel depressed.  Tell your health care provider if you have ever been abused or do not feel safe at home. This information is not intended to replace advice given to you by your health care provider. Make sure you discuss any questions you have with your health care provider. Document Released: 08/17/2010 Document Revised: 07/10/2015 Document Reviewed: 11/05/2014 Elsevier Interactive Patient Education  [employer].

## 2016-11-02 NOTE — Progress Notes (Signed)
I have reviewed this visit and discussed with Lauren Ducatte, RN, BSN, and agree with her documentation.   

## 2016-11-02 NOTE — Progress Notes (Signed)
Subjective:   Gina Bishop is a 83 y.o. female who presents for Medicare Annual (Subsequent) preventive examination.  Cardiac Risk Factors include: advanced age (>70men, >50 women);dyslipidemia;hypertension     Objective:     Vitals: BP 140/60 (BP Location: Right Arm, Cuff Size: Normal)   Pulse 70   Temp 98.5 F (36.9 C) (Oral)   Ht 5\' 5"  (1.651 m)   Wt 162 lb 9.6 oz (73.8 kg)   SpO2 99%   BMI 27.06 kg/m   Body mass index is 27.06 kg/m.   Tobacco History  Smoking Status  . Never Smoker  Smokeless Tobacco  . Never Used     Counseling given: Yes Patient has never smoked and has no plans to start.   Past Medical History:  Diagnosis Date  . Allergy   . Arthritis   . Blood transfusion   . Hyperlipidemia   . Hypertension    Past Surgical History:  Procedure Laterality Date  . ABDOMINAL HYSTERECTOMY    . APPENDECTOMY    . CESAREAN SECTION    . JOINT REPLACEMENT     Family History  Problem Relation Age of Onset  . Kidney disease Mother   . Cancer Mother        Kidney  . Heart disease Father   . Cancer Brother        Colon  . Diabetes Son        T1DM  . Rosacea Son    History  Sexual Activity  . Sexual activity: Not Currently    Outpatient Encounter Prescriptions as of 11/02/2016  Medication Sig  . amLODipine (NORVASC) 10 MG tablet TAKE 1 TABLET BY MOUTH DAILY  . benazepril (LOTENSIN) 40 MG tablet Take 1 tablet (40 mg total) by mouth daily.  Marland Kitchen dicyclomine (BENTYL) 10 MG capsule Take 1 capsule (10 mg total) by mouth 3 (three) times daily as needed for spasms.  . fish oil-omega-3 fatty acids 1000 MG capsule Take 1 g by mouth daily.    Marland Kitchen gabapentin (NEURONTIN) 100 MG capsule TAKE ONE CAPSULE BY MOUTH EVERY NIGHT AT BEDTIME  . meloxicam (MOBIC) 7.5 MG tablet Take 1 tablet (7.5 mg total) by mouth daily.  . metoprolol succinate (TOPROL-XL) 25 MG 24 hr tablet TAKE 1 TABLET BY MOUTH EVERY DAY  . pramipexole (MIRAPEX) 0.5 MG tablet Take 1 tablet (0.5 mg  total) by mouth at bedtime.  . pravastatin (PRAVACHOL) 40 MG tablet TAKE 1 TABLET(40 MG) BY MOUTH DAILY  . alendronate (FOSAMAX) 70 MG tablet TAKE 1 TABLET BY MOUTH EVERY WEEK ON AN EMPTY STOMACH 30 TO 60 MINUTES BEFORE BREAKFAST WITH 8 TO 12 OUNCES OF WATER/ DO NOT LIE DOWN (Patient not taking: Reported on 11/02/2016)  . aspirin 81 MG tablet Take 1 tablet (81 mg total) by mouth daily. (Patient not taking: Reported on 11/02/2016)  . meclizine (ANTIVERT) 25 MG tablet Take 1 tablet (25 mg total) by mouth 3 (three) times daily as needed for dizziness. (Patient not taking: Reported on 11/02/2016)  . [DISCONTINUED] guaiFENesin-codeine 100-10 MG/5ML syrup Take 5 mLs by mouth 3 (three) times daily as needed for cough. (Patient not taking: Reported on 11/02/2016)  . [DISCONTINUED] ondansetron (ZOFRAN) 4 MG tablet Take 1 tablet (4 mg total) by mouth every 8 (eight) hours as needed for nausea or vomiting. (Patient not taking: Reported on 11/02/2016)   No facility-administered encounter medications on file as of 11/02/2016.     Activities of Daily Living In your present state of health,  do you have any difficulty performing the following activities: 11/02/2016  Hearing? Y  Vision? N  Difficulty concentrating or making decisions? N  Walking or climbing stairs? N  Dressing or bathing? N  Doing errands, shopping? N  Preparing Food and eating ? N  Using the Toilet? N  In the past six months, have you accidently leaked urine? N  Do you have problems with loss of bowel control? N  Managing your Medications? N  Managing your Finances? N  Housekeeping or managing your Housekeeping? N  Some recent data might be hidden   Home Safety:  My home has a working smoke alarm:  Yes X 2           My home throw rugs have been fastened down to the floor or removed:  Non-slip back I have a non-slip surface or non-slip mats in the bathtub and shower:  Non-slip surface        All my home's stairs have handrails, including any  outdoor stairs  Two level home with handrails inside and 2 outside steps with handrail      My home's floors, stairs and hallways are free from clutter, wires and cords:  Yes     I have animals in my home  No I wear seatbelts consistently:  Yes    Patient Care Team: [name], MD as PCP - General (Family Medicine) Joycie Peek, MD (Dental General Practice)  Dr. Antionette Fairy for Opthalmology  Assessment:     Exercise Activities and Dietary recommendations Current Exercise Habits: Structured exercise class (Aquatics ), Time (Minutes): 45, Frequency (Times/Week): 4, Weekly Exercise (Minutes/Week): 180, Intensity: Moderate, Exercise limited by: None identified  Goals    . Blood Pressure < 140/90    . Increase water intake    . Maintain current level of physical activity          Aquacise and chair yoga 3-4 X per week 45 minutes per time      Fall Risk Fall Risk  11/02/2016 11/05/2015 08/07/2015 05/15/2015 05/08/2015  Falls in the past year? No No No No No   TUG Test:  Done in 10 seconds. Falls prevention discussed in detail and literature given.  Cognitive Function: Mini-Cog  Failed with score 2/5   Depression Screen PHQ 2/9 Scores 11/02/2016 11/05/2015 08/07/2015 05/15/2015  PHQ - 2 Score 0 0 0 0   Patient became teary when speaking about stressor of husband with dementia. Agreed to speak with [facility] counselor directly after visit. Warm hand-off to in-house LCSW.   Cognitive Function MMSE - Mini Mental State Exam 08/07/2013 12/23/2010  Orientation to time 5 5  Orientation to Place 3 5  Registration 3 3  Attention/ Calculation 5 5  Recall 3 2  Language- name 2 objects 2 2  Language- repeat 1 1  Language- follow 3 step command 3 3  Language- read & follow direction 1 1  Write a sentence 1 1  Copy design 1 1  Total score 28 29        Immunization History  Administered Date(s) Administered  . Influenza Split 12/11/2010, 12/22/2011  . Influenza,inj,Quad PF,6+ Mos  11/17/2012, 11/21/2013, 11/28/2014, 11/05/2015  . Pneumococcal Conjugate-13 11/21/2013  . Pneumococcal Polysaccharide-23 11/17/2012  . Tdap 01/10/2013   Screening Tests Health Maintenance  Topic Date Due  . INFLUENZA VACCINE  09/15/2016  . TETANUS/TDAP  01/11/2023  . DEXA SCAN  Completed  . PNA vac Low Risk Adult  Completed   Flu vaccine unavailable  today. Patient had brother die of colon cancer at age 43 Fit ordered and explained by lab staff    Plan:      I have personally reviewed and noted the following in the patient's chart:   . Medical and social history . Use of alcohol, tobacco or illicit drugs  . Current medications and supplements . Functional ability and status . Nutritional status . Physical activity . Advanced directives . List of other physicians . Hospitalizations, surgeries, and ER visits in previous 12 months . Vitals . Screenings to include cognitive, depression, and falls . Referrals and appointments  In addition, I have reviewed and discussed with patient certain preventive protocols, quality metrics, and best practice recommendations. A written personalized care plan for preventive services as well as general preventive health recommendations were provided to patient.     Velora Heckler, RN  11/02/2016

## 2016-11-02 NOTE — Progress Notes (Signed)
Total time:20 minutes Type of Service: [place] warm handoff  Interpreter:No.   SUBJECTIVE: Gina Bishop is a 83 y.o. female referred by Gina Garre, RN for:  family stressors. Patient reports the following concerns: husband has dementia and is in denial.    LIFE CONTEXT:  Family & Social: lives with husband of 14 years and son, patient is very active in her church and exercises daily, has family and church support.  School/ Work: retired   Life changes: son will be moving out soon, husband's behavior is changing (agitated, and paranoia).   GOALS:  Patient will reduce symptoms of: stress, and increase knowledge and ability EX:HBZJIR skills and stress reduction.  Increase healthy adjustment to current life circumstances  Intervention:  Supportive Counseling, [employer] and Psychoeducation   Issues discussed: support system, top stressors, managing stressors, Neurosurgeon and community support/ resources.       ASSESSMENT:Patient currently experiencing stress due to husband progression of dementia.  Patient may benefit from,  receive further assessment brief therapeutic interventions to assist with managing her stress.  Patient declined phone follow up but in agreement to F/U with [facility] during next office visit.  Patient appreciative of resources provided.   PLAN:   1. .Patient will F/U with LCSW during next vist with PCP  2. Patient will read book provided "Caring for a person with AD" from [place]  3. Referral:[place] [facility]  Warm Hand Off Completed.     Casimer Lanius, LCSW Licensed Clinical Social Worker [facility]   [phone] 2:59 PM

## 2016-11-24 ENCOUNTER — Encounter: Payer: Medicare Other | Admitting: Family Medicine

## 2016-11-28 ENCOUNTER — Other Ambulatory Visit: Payer: Self-pay | Admitting: Family Medicine

## 2016-11-28 DIAGNOSIS — I10 Essential (primary) hypertension: Secondary | ICD-10-CM

## 2016-12-06 ENCOUNTER — Other Ambulatory Visit: Payer: Self-pay | Admitting: Family Medicine

## 2016-12-06 DIAGNOSIS — I10 Essential (primary) hypertension: Secondary | ICD-10-CM

## 2016-12-09 ENCOUNTER — Encounter: Payer: Self-pay | Admitting: Family Medicine

## 2016-12-09 ENCOUNTER — Ambulatory Visit (INDEPENDENT_AMBULATORY_CARE_PROVIDER_SITE_OTHER): Payer: Medicare Other | Admitting: Family Medicine

## 2016-12-09 ENCOUNTER — Other Ambulatory Visit: Payer: Self-pay

## 2016-12-09 DIAGNOSIS — Z0000 Encounter for general adult medical examination without abnormal findings: Secondary | ICD-10-CM | POA: Diagnosis not present

## 2016-12-09 DIAGNOSIS — I10 Essential (primary) hypertension: Secondary | ICD-10-CM | POA: Diagnosis not present

## 2016-12-09 DIAGNOSIS — Z1211 Encounter for screening for malignant neoplasm of colon: Secondary | ICD-10-CM | POA: Diagnosis not present

## 2016-12-09 DIAGNOSIS — M47812 Spondylosis without myelopathy or radiculopathy, cervical region: Secondary | ICD-10-CM

## 2016-12-09 DIAGNOSIS — E559 Vitamin D deficiency, unspecified: Secondary | ICD-10-CM

## 2016-12-09 DIAGNOSIS — Z23 Encounter for immunization: Secondary | ICD-10-CM | POA: Diagnosis not present

## 2016-12-09 MED ORDER — GABAPENTIN 100 MG PO CAPS: 100.0000 mg | ORAL_CAPSULE | Freq: Every day | ORAL | 3 refills | Status: DC

## 2016-12-09 MED ORDER — PRAMIPEXOLE DIHYDROCHLORIDE 0.5 MG PO TABS: 0.5000 mg | ORAL_TABLET | Freq: Every day | ORAL | 3 refills | Status: DC

## 2016-12-09 NOTE — Patient Instructions (Addendum)
I put in the future orders for lab testing.  Please wait until after 10/31.  We open at 8:30.  Best if blood work done fasting. Good luck at home Let me know if you want me to send in a prescription for the new Shingles vaccine.   Keep an eye on your blood pressure.  If it continues to run good, see me in 1 year.

## 2016-12-13 ENCOUNTER — Encounter: Payer: Self-pay | Admitting: Family Medicine

## 2016-12-13 NOTE — Assessment & Plan Note (Signed)
At goal on current therapy.   

## 2016-12-13 NOTE — Assessment & Plan Note (Signed)
Recheck vit D level.

## 2016-12-13 NOTE — Progress Notes (Signed)
   Subjective:    Patient ID: Gina Bishop, female    DOB: [birth year], 83 y.o.   MRN: [id number]  HPI  Annual exam.  Just had medicare annual wellness visit.  She is nicely up to date on most topics.  Issues. 1. Home stress.  Husband has progressive dementia, which is obviously tough.  She is coping and has good support. 2. Hypertension.  Brings in home BP readings which are good.  Pulse trends a bit low high 50s and low sixties.  One reading for pulse is 48.  Low diastolics.  Hers is isolated systolic HBP.  Will be due for annual labs after 10/31. 3. Brought in FIT testing - at age 17, I am not sure that we need continued colon cancer screen.   4. Will get flu shot. 5. Hx of low vitamin D.  Has been taking supplements.  Needs recheck.   6. Due for shingles vaccine.  Discussed that it is a tough shot.    Review of Systems No CP, DOE, Wt change, appetite change, nl BM, no bleeding, no neuro sx, no worrisome skin lesions.     Objective:   Physical ExamHEENT nl Neck supple without nodes. Lungs clear Cardiac RRR without m or g Abd benign Ext no edema, normal pulse Neuro WNL        Assessment & Plan:

## 2016-12-13 NOTE — Assessment & Plan Note (Signed)
Very healthy woman with no concerning habits.

## 2016-12-15 LAB — FECAL OCCULT BLOOD, IMMUNOCHEMICAL: Fecal Occult Bld: NEGATIVE

## 2017-01-19 DIAGNOSIS — H401131 Primary open-angle glaucoma, bilateral, mild stage: Secondary | ICD-10-CM | POA: Diagnosis not present

## 2017-01-20 ENCOUNTER — Other Ambulatory Visit: Payer: Medicare Other

## 2017-01-20 DIAGNOSIS — I10 Essential (primary) hypertension: Secondary | ICD-10-CM

## 2017-01-20 DIAGNOSIS — E559 Vitamin D deficiency, unspecified: Secondary | ICD-10-CM | POA: Diagnosis not present

## 2017-01-21 ENCOUNTER — Encounter: Payer: Self-pay | Admitting: Family Medicine

## 2017-01-21 LAB — LIPID PANEL
CHOL/HDL RATIO: 3.4 ratio (ref 0.0–4.4)
CHOLESTEROL TOTAL: 212 mg/dL — AB (ref 100–199)
HDL: 62 mg/dL (ref >39)
LDL CALC: 122 mg/dL — AB (ref 0–99)
TRIGLYCERIDES: 138 mg/dL (ref 0–149)
VLDL Cholesterol Cal: 28 mg/dL (ref 5–40)

## 2017-01-21 LAB — CMP14+EGFR
A/G RATIO: 1.9 (ref 1.2–2.2)
ALT: 19 IU/L (ref 0–32)
AST: 25 IU/L (ref 0–40)
Albumin: 4.3 g/dL (ref 3.5–4.7)
Alkaline Phosphatase: 76 IU/L (ref 39–117)
BILIRUBIN TOTAL: 0.4 mg/dL (ref 0.0–1.2)
BUN/Creatinine Ratio: 25 (ref 12–28)
BUN: 24 mg/dL (ref 8–27)
CHLORIDE: 103 mmol/L (ref 96–106)
CO2: 24 mmol/L (ref 20–29)
Calcium: 9.4 mg/dL (ref 8.7–10.3)
Creatinine, Ser: 0.95 mg/dL (ref 0.57–1.00)
GFR calc non Af Amer: 56 mL/min/{1.73_m2} — ABNORMAL LOW (ref >59)
GFR, EST AFRICAN AMERICAN: 64 mL/min/{1.73_m2} (ref >59)
GLOBULIN, TOTAL: 2.3 g/dL (ref 1.5–4.5)
Glucose: 86 mg/dL (ref 65–99)
POTASSIUM: 4.6 mmol/L (ref 3.5–5.2)
SODIUM: 141 mmol/L (ref 134–144)
Total Protein: 6.6 g/dL (ref 6.0–8.5)

## 2017-01-21 LAB — VITAMIN D 25 HYDROXY (VIT D DEFICIENCY, FRACTURES): Vit D, 25-Hydroxy: 36.8 ng/mL (ref 30.0–100.0)

## 2017-01-27 ENCOUNTER — Other Ambulatory Visit: Payer: Self-pay | Admitting: Family Medicine

## 2017-01-27 DIAGNOSIS — I10 Essential (primary) hypertension: Secondary | ICD-10-CM

## 2017-02-22 ENCOUNTER — Encounter: Payer: Self-pay | Admitting: Family Medicine

## 2017-02-22 ENCOUNTER — Other Ambulatory Visit: Payer: Self-pay

## 2017-02-22 ENCOUNTER — Encounter: Payer: Medicare Other | Admitting: Family Medicine

## 2017-02-22 ENCOUNTER — Ambulatory Visit: Payer: Medicare Other | Admitting: Family Medicine

## 2017-02-22 ENCOUNTER — Ambulatory Visit (INDEPENDENT_AMBULATORY_CARE_PROVIDER_SITE_OTHER): Payer: Medicare Other | Admitting: Family Medicine

## 2017-02-22 DIAGNOSIS — J069 Acute upper respiratory infection, unspecified: Secondary | ICD-10-CM

## 2017-02-22 NOTE — Patient Instructions (Signed)
It was a pleasure to see you today! Thank you for choosing [facility] for your primary care. Gina Bishop was seen for cough consistent with common cold. Come back to the clinic if cough last longer than 2 weeks, and go to the emergency room if cough gets severe, fevers, vomiting, can't take meds or food by mouth.   Take tylenol for pain 1000 mg two times a day.  Robitussin DM or honey for cough as needed.  Uses steam in hot shower or humidifier for congestion.   Best,  Marny Lowenstein, MD, [place] - PGY1 02/22/2017 8:51 AM

## 2017-02-22 NOTE — Progress Notes (Signed)
Entered in Error This encounter was created in error - please disregard.

## 2017-02-22 NOTE — Progress Notes (Signed)
Subjective:     Gina Bishop is a 84 y.o. female who presents for evaluation of chills, nasal congestion, productive cough and sore throat. Symptoms began 4 day ago. Symptoms have been unchanged since that time. Past history is significant for nothing for respiratory diseases.   Also incontinent with cough. No dysuria. No blood in urine. Not coughing up any blood. Husband has been sick with cough, but it is improved. Had similar symptoms several years when "sodium" low. No h/o heart failure, GERD. No sour taste in mouth. Coricidin tablets and Zycam. But not been helping. Cough keeps up at night.    Review of Systems Pertinent items noted in HPI and remainder of comprehensive ROS otherwise negative.    Objective:    There were no vitals taken for this visit. General appearance: alert and cooperative Head: Normocephalic, without obvious abnormality, atraumatic, sinuses nontender to percussion Eyes: conjunctivae/corneas clear. PERRL, EOM's intact. Fundi benign. Nose: mild congestion, no polyps, no crusting or bleeding points Throat: lips, mucosa, and tongue normal; teeth and gums normal Neck: no adenopathy, no carotid bruit, no JVD, supple, symmetrical, trachea midline and thyroid not enlarged, symmetric, no tenderness/mass/nodules Lungs: clear to auscultation bilaterally and Work of breathing Heart: regular rate and rhythm, S1, S2 normal, no murmur, click, rub or gallop    Assessment:    URI with Post Nasal Drip   Symptoms most consistent with a URI with some postnasal drip given that the patient has persistent sore throat.  Patient has no other risk factors for respiratory disease.  Patient has no wheezing or increased work of breath or making asthma or significant respiratory disease less likely.     Plan:    Worsening signs and symptoms discussed. Rest, fluids, acetaminophen, and humidification. Follow up as needed for persistent, worsening cough, or appearance of new symptoms.

## 2017-02-23 ENCOUNTER — Encounter: Payer: Self-pay | Admitting: Family Medicine

## 2017-07-21 DIAGNOSIS — H401131 Primary open-angle glaucoma, bilateral, mild stage: Secondary | ICD-10-CM | POA: Diagnosis not present

## 2017-07-21 DIAGNOSIS — H52223 Regular astigmatism, bilateral: Secondary | ICD-10-CM | POA: Diagnosis not present

## 2017-07-21 DIAGNOSIS — H43813 Vitreous degeneration, bilateral: Secondary | ICD-10-CM | POA: Diagnosis not present

## 2017-07-21 DIAGNOSIS — D3131 Benign neoplasm of right choroid: Secondary | ICD-10-CM | POA: Diagnosis not present

## 2017-07-21 DIAGNOSIS — Z961 Presence of intraocular lens: Secondary | ICD-10-CM | POA: Diagnosis not present

## 2017-07-21 DIAGNOSIS — H5213 Myopia, bilateral: Secondary | ICD-10-CM | POA: Diagnosis not present

## 2017-07-21 DIAGNOSIS — H04123 Dry eye syndrome of bilateral lacrimal glands: Secondary | ICD-10-CM | POA: Diagnosis not present

## 2017-08-23 ENCOUNTER — Other Ambulatory Visit: Payer: Self-pay | Admitting: Family Medicine

## 2017-08-23 DIAGNOSIS — I10 Essential (primary) hypertension: Secondary | ICD-10-CM

## 2017-09-22 ENCOUNTER — Ambulatory Visit (HOSPITAL_COMMUNITY)
Admission: RE | Admit: 2017-09-22 | Discharge: 2017-09-22 | Disposition: A | Discharge status: Home / Self Care | Payer: Medicare Other | Source: Ambulatory Visit | Attending: Family Medicine | Admitting: Family Medicine

## 2017-09-22 ENCOUNTER — Other Ambulatory Visit: Payer: Self-pay

## 2017-09-22 ENCOUNTER — Ambulatory Visit (INDEPENDENT_AMBULATORY_CARE_PROVIDER_SITE_OTHER): Payer: Medicare Other | Admitting: Family Medicine

## 2017-09-22 ENCOUNTER — Encounter: Payer: Self-pay | Admitting: Family Medicine

## 2017-09-22 VITALS — BP 140/62 | HR 66 | Temp 98.4°F | Ht 65.0 in | Wt 161.6 lb

## 2017-09-22 DIAGNOSIS — R531 Weakness: Secondary | ICD-10-CM

## 2017-09-22 DIAGNOSIS — I10 Essential (primary) hypertension: Secondary | ICD-10-CM

## 2017-09-22 DIAGNOSIS — I951 Orthostatic hypotension: Secondary | ICD-10-CM | POA: Insufficient documentation

## 2017-09-22 LAB — POCT URINALYSIS DIP (MANUAL ENTRY)
BILIRUBIN UA: NEGATIVE mg/dL
Bilirubin, UA: NEGATIVE
Blood, UA: NEGATIVE
Glucose, UA: NEGATIVE mg/dL
NITRITE UA: NEGATIVE
PROTEIN UA: NEGATIVE mg/dL
Spec Grav, UA: 1.015 (ref 1.010–1.025)
UROBILINOGEN UA: 0.2 U/dL
pH, UA: 6 (ref 5.0–8.0)

## 2017-09-22 LAB — POCT UA - MICROSCOPIC ONLY

## 2017-09-22 NOTE — Assessment & Plan Note (Signed)
On amlodipine, ACE inhibitor, and carvedilol. Hold ACE inhibitor in setting of marked orthostasis.

## 2017-09-22 NOTE — Patient Instructions (Addendum)
It was nice to see you today.  Please stop your Benazepril   Drink plenty of water---- 8 glasses per day   Go to the lab  Please schedule an appointment on Monday or Tuesday of next week   Dorris Singh, MD

## 2017-09-22 NOTE — Assessment & Plan Note (Signed)
Likely due to viral illness vs. Medication induced. Will check electrolytes. Plan for close followup.

## 2017-09-22 NOTE — Progress Notes (Signed)
Patient Name: Gina Bishop Date of Birth: [birth year] Date of Visit: 09/22/17 PCP: [name], MD  Chief Complaint: legs are giving way   Subjective: Gina Bishop is a pleasant female with medical history significant for vertigo, hypertension, dyslipidemia, and arthritis  presenting today for leg weakness when walking. She reports she was in her usual state of health until yesterday when she began to notice fatigue, some muscle aches, and overall malaise. She noted when she walked her legs felt 'heavy' and 'like they are going to give way'. This has worsened over the last day. The symptoms are the worst when she stands up at first. She associated sensation of weakness and lightheadedness. This is different than her prior vertigo. She reports mild nausea, reduced appetite, and urinary frequency of the last day. She additionally reports one day of diarrhea. She has been drinking water and gatorade. She reports a history of severe hyponatremia.  Denies fevers, headaches, sick contacts, difficulty speaking, numbness, tingling, vomiting,  or weakness more on one side than the other. She is taking all of her medications including amlodipine, losartan, and metoprolol. She tried meclizine once for her symptoms.   ROS:  Review of Systems  Constitutional: Positive for malaise/fatigue. Negative for chills, fever and weight loss.  HENT: Negative for hearing loss and tinnitus.   Eyes: Negative for blurred vision.  Respiratory: Negative for shortness of breath.   Cardiovascular: Negative for chest pain.  Gastrointestinal: Positive for diarrhea and nausea. Negative for abdominal pain and vomiting.  Genitourinary: Positive for frequency. Negative for dysuria and hematuria.  Skin: Negative for rash.  Neurological: Positive for dizziness and weakness. Negative for tingling, tremors, speech change, focal weakness, seizures, loss of consciousness and headaches.    I have reviewed the patient's medical,  surgical, family, and social history as appropriate.   PMH: Hypertension Vertigo  Family History: Diabetes  Social History: No EtOH use Lives with husband  Independent with ADLS   Vitals:   16/10/96 0934  BP: 140/62  Pulse: 66  Temp: 98.4 F (36.9 C)  SpO2: 97%   Filed Weights   09/22/17 0934  Weight: 161 lb 9.6 oz (73.3 kg)   Vital signs reviewed. Positive orthostatic vital signs with symptoms HEENT: Sclera anicteric. Dentition is moderate. Appears well hydrated. Neck: Supple Cardiac: Regular rate and rhythm. Normal S1/S2. No murmurs, rubs, or gallops appreciated. Lungs: Clear bilaterally to ascultation.  Abdomen: Normoactive bowel sounds. No tenderness to deep or light palpation. No rebound or guarding.  Extremities: Warm, well perfused without edema.  Skin: Warm, dry Psych: Pleasant and appropriate  NEURO: Cranial nerves II-XII tested and intact Symmetric UE and LE reflexes 5/5 strength in hip flexion, knee flexion/extension, dorsi/plantar flexion Negative Rhomberg  Normal finger to nose Alert and oriented to person, place, date  EKG shows NSR with PACs, resolved on physical exam, no ST/T wave changes   Arisbel was seen today for dizziness and gait problem.  Diagnoses and all orders for this visit:  Orthostatic Hypotension, acute problem, uncertain etiology and prognosis. In setting of likely viral illness, medication side effect, or electrolyte abnormality (including concern for hyponatremia).  EKG shows no signs of ischemia. Neurological examination is intact, notable finding is orthostasis on examination. most likely due to GI bug with nausea and diarrhea. She is tolerating fluids well. Given hypotension, will hold Benazepril at this time. Differential for cause  -     EKG 12-Lead -     POCT urinalysis dipstick -  CBC with Differential -     Magnesium -     Comprehensive metabolic panel -     Urine Culture -     POCT UA - Microscopic Only  Return to  care on Monday or Tuesday to check in. Strict return precautions were given.   Dorris Singh, MD  Family Medicine Teaching Service

## 2017-09-23 LAB — CBC WITH DIFFERENTIAL/PLATELET
Basophils Absolute: 0.1 10*3/uL (ref 0.0–0.2)
Basos: 1 %
EOS (ABSOLUTE): 0.1 10*3/uL (ref 0.0–0.4)
EOS: 1 %
HEMOGLOBIN: 13 g/dL (ref 11.1–15.9)
Hematocrit: 39 % (ref 34.0–46.6)
IMMATURE GRANS (ABS): 0 10*3/uL (ref 0.0–0.1)
IMMATURE GRANULOCYTES: 0 %
LYMPHS: 14 %
Lymphocytes Absolute: 1.1 10*3/uL (ref 0.7–3.1)
MCH: 29.9 pg (ref 26.6–33.0)
MCHC: 33.3 g/dL (ref 31.5–35.7)
MCV: 90 fL (ref 79–97)
MONOCYTES: 10 %
MONOS ABS: 0.7 10*3/uL (ref 0.1–0.9)
NEUTROS PCT: 74 %
Neutrophils Absolute: 5.8 10*3/uL (ref 1.4–7.0)
Platelets: 225 10*3/uL (ref 150–450)
RBC: 4.35 x10E6/uL (ref 3.77–5.28)
RDW: 12 % — AB (ref 12.3–15.4)
WBC: 7.8 10*3/uL (ref 3.4–10.8)

## 2017-09-23 LAB — COMPREHENSIVE METABOLIC PANEL
ALBUMIN: 4.8 g/dL — AB (ref 3.5–4.7)
ALK PHOS: 74 IU/L (ref 39–117)
ALT: 14 IU/L (ref 0–32)
AST: 24 IU/L (ref 0–40)
Albumin/Globulin Ratio: 2.1 (ref 1.2–2.2)
BUN / CREAT RATIO: 21 (ref 12–28)
BUN: 22 mg/dL (ref 8–27)
Bilirubin Total: 0.5 mg/dL (ref 0.0–1.2)
CALCIUM: 10 mg/dL (ref 8.7–10.3)
CO2: 20 mmol/L (ref 20–29)
CREATININE: 1.07 mg/dL — AB (ref 0.57–1.00)
Chloride: 101 mmol/L (ref 96–106)
GFR calc Af Amer: 55 mL/min/{1.73_m2} — ABNORMAL LOW (ref >59)
GFR, EST NON AFRICAN AMERICAN: 48 mL/min/{1.73_m2} — AB (ref >59)
GLUCOSE: 113 mg/dL — AB (ref 65–99)
Globulin, Total: 2.3 g/dL (ref 1.5–4.5)
Potassium: 5.2 mmol/L (ref 3.5–5.2)
Sodium: 140 mmol/L (ref 134–144)
Total Protein: 7.1 g/dL (ref 6.0–8.5)

## 2017-09-23 LAB — MAGNESIUM: Magnesium: 2 mg/dL (ref 1.6–2.3)

## 2017-09-26 ENCOUNTER — Other Ambulatory Visit: Payer: Self-pay

## 2017-09-26 ENCOUNTER — Encounter: Payer: Self-pay | Admitting: Family Medicine

## 2017-09-26 ENCOUNTER — Ambulatory Visit (INDEPENDENT_AMBULATORY_CARE_PROVIDER_SITE_OTHER): Payer: Medicare Other | Admitting: Family Medicine

## 2017-09-26 VITALS — BP 142/72 | HR 62 | Temp 98.3°F | Ht 65.0 in | Wt 162.6 lb

## 2017-09-26 DIAGNOSIS — N179 Acute kidney failure, unspecified: Secondary | ICD-10-CM

## 2017-09-26 DIAGNOSIS — I10 Essential (primary) hypertension: Secondary | ICD-10-CM | POA: Diagnosis not present

## 2017-09-26 DIAGNOSIS — I951 Orthostatic hypotension: Secondary | ICD-10-CM | POA: Diagnosis not present

## 2017-09-26 DIAGNOSIS — J301 Allergic rhinitis due to pollen: Secondary | ICD-10-CM

## 2017-09-26 DIAGNOSIS — E860 Dehydration: Secondary | ICD-10-CM | POA: Diagnosis not present

## 2017-09-26 LAB — URINE CULTURE

## 2017-09-26 MED ORDER — LORATADINE 10 MG PO TABS: 10.0000 mg | ORAL_TABLET | Freq: Every day | ORAL | 3 refills | Status: DC

## 2017-09-26 NOTE — Assessment & Plan Note (Signed)
Resolved, repeat BMP.

## 2017-09-26 NOTE — Assessment & Plan Note (Signed)
Recommended Flonase, which she cannot tolerate due to skin irritation along nares. Will trial non-sedating anti-histamine. Reviewed side effects.

## 2017-09-26 NOTE — Progress Notes (Addendum)
  Patient Name: Toniqua Melamed Date of Birth: Jan 06, 1932 Date of Visit: 09/26/17 PCP: [name], MD  Chief Complaint:   Subjective: Gina Bishop is a pleasant woman with medical history significant for essential hypertension, dyslipidemia, and osteoporosis presenting today for check in for blood pressure and repeat labs. She was seen last week for orthostasis and possible viral enteritis. She reports her appetite has improved. Denies dizziness, chest pain, dyspnea, palpitations. She has stopped Mobic and Benazepril. Feels overall much improved.   Mrs. Rother believes part of her condition may be related to her husband. He suffers from dementia. She recently lost her son to colon cancer (in March 2019). She feels she has support in her life and purpose. She worries about her husband. He still drives. He requires assistance with some ADLs.    Ms. Kusch reports many years of itchy watery eyes and nasal congestion. She previously used Flonase but had a reaction to this. She wonders what else to take. Denies tinnitus.    ROS:  ROS  As above.   I have reviewed the patient's medical, surgical, family, and social history as appropriate.   Vitals:   09/26/17 0923  BP: (!) 142/72  Pulse: 62  Temp: 98.3 F (36.8 C)  SpO2: 98%   Filed Weights   09/26/17 0923  Weight: 162 lb 9.6 oz (73.8 kg)    HEENT: Sclera anicteric. Dentition is moderate. Appears well hydrated. Neck: Supple Cardiac: Regular rate and rhythm. Normal S1/S2. No murmurs, rubs, or gallops appreciated. Lungs: Clear bilaterally to ascultation.  Extremities: Warm, well perfused without edema.  Skin: Warm, dry Psych: Pleasant and appropriate    Ashleyann was seen today for follow-up.  Diagnoses and all orders for this visit:  Dehydration, clinically improved. No more symptoms of orthostasis, GI symptoms resolved, appetite improved. Restart ACE inhibitor if creatinine improved.  -     Basic Metabolic Panel  Acute Kidney  Injury, mild due to dehydration, stopped ACE inhibitor and NSAID. Will repeat today.   Hypertension at goal, restart ACE inhibitor at a lower dose as able.   Seasonal allergic rhinitis due to pollen -     loratadine (CLARITIN) 10 MG tablet; Take 1 tablet (10 mg total) by mouth daily.   Dorris Singh, MD  Family Medicine Teaching Service

## 2017-09-26 NOTE — Patient Instructions (Signed)
Go to the lab    I will call you with results.   We will restart your Benazepril if your kidney function improves

## 2017-09-26 NOTE — Assessment & Plan Note (Signed)
Repeat BMP. Will restart ACE inhibitor pending results.

## 2017-09-27 ENCOUNTER — Telehealth: Payer: Self-pay | Admitting: Family Medicine

## 2017-09-27 DIAGNOSIS — I10 Essential (primary) hypertension: Secondary | ICD-10-CM

## 2017-09-27 LAB — BASIC METABOLIC PANEL
BUN/Creatinine Ratio: 21 (ref 12–28)
BUN: 21 mg/dL (ref 8–27)
CO2: 21 mmol/L (ref 20–29)
CREATININE: 0.99 mg/dL (ref 0.57–1.00)
Calcium: 9.8 mg/dL (ref 8.7–10.3)
Chloride: 98 mmol/L (ref 96–106)
GFR calc Af Amer: 61 mL/min/{1.73_m2} (ref >59)
GFR, EST NON AFRICAN AMERICAN: 53 mL/min/{1.73_m2} — AB (ref >59)
Glucose: 91 mg/dL (ref 65–99)
Potassium: 4.7 mmol/L (ref 3.5–5.2)
SODIUM: 135 mmol/L (ref 134–144)

## 2017-09-27 MED ORDER — BENAZEPRIL HCL 10 MG PO TABS: 10.0000 mg | ORAL_TABLET | Freq: Every day | ORAL | 3 refills | Status: DC

## 2017-09-27 NOTE — Telephone Encounter (Signed)
Called patient. Creatinine normalized. Benazepril restarted at 10 mg. Repeat BMP in 2 weeks.

## 2017-09-27 NOTE — Assessment & Plan Note (Signed)
Restarted benazepril 10 mg repeat BMP in 2 weeks.

## 2017-10-13 ENCOUNTER — Telehealth: Payer: Self-pay | Admitting: *Deleted

## 2017-10-13 NOTE — Telephone Encounter (Signed)
LMOVM for pt to call back and make a lab appt. Acy Orsak Kennon Holter, CMA

## 2017-10-13 NOTE — Telephone Encounter (Signed)
-----   Message from Martyn Malay, MD sent at 10/11/2017  3:52 PM EDT ----- Regarding: Repeat Kidney Function Please call patient and ask her to come to lab to have kidney function checked. She just restarted benazepril. This has been ordered. Thanks!

## 2017-10-13 NOTE — Telephone Encounter (Signed)
Spoke with patient. She will come next week sometime, leaving for the beach now. Danley Danker, RN [facility] RN)

## 2017-10-14 ENCOUNTER — Telehealth: Payer: Self-pay | Admitting: Family Medicine

## 2017-10-14 ENCOUNTER — Other Ambulatory Visit: Payer: Medicare Other

## 2017-10-14 DIAGNOSIS — L304 Erythema intertrigo: Secondary | ICD-10-CM

## 2017-10-14 DIAGNOSIS — I10 Essential (primary) hypertension: Secondary | ICD-10-CM

## 2017-10-14 MED ORDER — KETOCONAZOLE 2 % EX CREA: 1.0000 "application " | TOPICAL_CREAM | Freq: Two times a day (BID) | CUTANEOUS | 1 refills | Status: DC

## 2017-10-14 NOTE — Telephone Encounter (Signed)
Patient seen while obtain labs for creatinine. Reports new onset erythematous rash in inframammary area. Rx for ketoconazole prescribed. Instructed on cleaning area, keeping dry, cotton garments, avoiding hard underwires.

## 2017-10-15 LAB — BASIC METABOLIC PANEL
BUN/Creatinine Ratio: 26 (ref 12–28)
BUN: 21 mg/dL (ref 8–27)
CALCIUM: 9.5 mg/dL (ref 8.7–10.3)
CO2: 22 mmol/L (ref 20–29)
Chloride: 99 mmol/L (ref 96–106)
Creatinine, Ser: 0.82 mg/dL (ref 0.57–1.00)
GFR calc Af Amer: 77 mL/min/{1.73_m2} (ref >59)
GFR, EST NON AFRICAN AMERICAN: 66 mL/min/{1.73_m2} (ref >59)
Glucose: 93 mg/dL (ref 65–99)
POTASSIUM: 5.1 mmol/L (ref 3.5–5.2)
Sodium: 137 mmol/L (ref 134–144)

## 2017-10-18 ENCOUNTER — Telehealth: Payer: Self-pay | Admitting: Family Medicine

## 2017-10-18 NOTE — Telephone Encounter (Signed)
Pt returned call, informed of normal lab results.  Wallace Cullens, RN

## 2017-10-18 NOTE — Telephone Encounter (Signed)
Attempted to call patient regarding normal labs.  Dorris Singh, MD  Family Medicine Teaching Service

## 2017-11-21 ENCOUNTER — Other Ambulatory Visit: Payer: Self-pay | Admitting: Family Medicine

## 2017-11-21 DIAGNOSIS — I10 Essential (primary) hypertension: Secondary | ICD-10-CM

## 2017-11-21 DIAGNOSIS — M47812 Spondylosis without myelopathy or radiculopathy, cervical region: Secondary | ICD-10-CM

## 2017-11-23 DIAGNOSIS — Z23 Encounter for immunization: Secondary | ICD-10-CM | POA: Diagnosis not present

## 2018-01-09 ENCOUNTER — Other Ambulatory Visit: Payer: Self-pay | Admitting: Family Medicine

## 2018-01-25 DIAGNOSIS — H52223 Regular astigmatism, bilateral: Secondary | ICD-10-CM | POA: Diagnosis not present

## 2018-01-25 DIAGNOSIS — Z961 Presence of intraocular lens: Secondary | ICD-10-CM | POA: Diagnosis not present

## 2018-01-25 DIAGNOSIS — H401131 Primary open-angle glaucoma, bilateral, mild stage: Secondary | ICD-10-CM | POA: Diagnosis not present

## 2018-01-25 DIAGNOSIS — H524 Presbyopia: Secondary | ICD-10-CM | POA: Diagnosis not present

## 2018-01-25 DIAGNOSIS — H04123 Dry eye syndrome of bilateral lacrimal glands: Secondary | ICD-10-CM | POA: Diagnosis not present

## 2018-02-22 ENCOUNTER — Other Ambulatory Visit: Payer: Self-pay | Admitting: Family Medicine

## 2018-02-22 ENCOUNTER — Ambulatory Visit (HOSPITAL_COMMUNITY)
Admission: RE | Admit: 2018-02-22 | Discharge: 2018-02-22 | Disposition: A | Discharge status: Home / Self Care | Payer: Medicare Other | Source: Ambulatory Visit | Attending: Family Medicine | Admitting: Family Medicine

## 2018-02-22 ENCOUNTER — Other Ambulatory Visit: Payer: Self-pay

## 2018-02-22 ENCOUNTER — Ambulatory Visit (INDEPENDENT_AMBULATORY_CARE_PROVIDER_SITE_OTHER): Payer: Medicare Other | Admitting: Family Medicine

## 2018-02-22 VITALS — BP 163/75 | HR 86 | Temp 98.3°F | Wt 165.0 lb

## 2018-02-22 DIAGNOSIS — J0110 Acute frontal sinusitis, unspecified: Secondary | ICD-10-CM | POA: Diagnosis not present

## 2018-02-22 DIAGNOSIS — I499 Cardiac arrhythmia, unspecified: Secondary | ICD-10-CM | POA: Diagnosis not present

## 2018-02-22 MED ORDER — FLUTICASONE PROPIONATE 50 MCG/ACT NA SUSP: 2.0000 | Freq: Every day | NASAL | 0 refills | Status: DC

## 2018-02-22 MED ORDER — AMOXICILLIN 875 MG PO TABS: 875.0000 mg | ORAL_TABLET | Freq: Two times a day (BID) | ORAL | 0 refills | Status: DC

## 2018-02-22 NOTE — Progress Notes (Signed)
   Subjective:    Gina Bishop - 85 y.o. female MRN [id number]  Date of birth: 12-12-1931  CC:  Gina Bishop is here for URI symptoms.  HPI: URI symptoms with concern for sinus infection - started before Christmas with general cold symptoms - seemed to be improving, but now her sinuses have been involved for about one week - current symptoms include sinus and tooth pain - also has runny nose and productive cough, but these are less concerning to her than her sinus symptoms - took Zycam, but had to stop since it gave her diarrhea - has taken Claritin, which helps a little, but is having pain in sinuses and teeth - has had sinus problems before but it does not seem to be this bad -Denies systemic symptoms, including fever, shortness of breath    Health Maintenance:  -Flu shot given today Health Maintenance Due  Topic Date Due  . INFLUENZA VACCINE  09/15/2017    -  reports that she has never smoked. She has never used smokeless tobacco. - Review of Systems: Per HPI. - Past Medical History: Patient Active Problem List   Diagnosis Date Noted  . Acute non-recurrent frontal sinusitis 02/22/2018  . Seasonal allergic rhinitis due to pollen 09/26/2017  . Orthostatic hypotension 09/22/2017  . Vertigo, labyrinthine 05/15/2015  . Cardiac arrhythmia 05/08/2015  . Hyponatremia 05/08/2015  . Solitary pulmonary nodule 05/08/2015  . Routine general medical examination at a health care facility 11/22/2013  . Vitamin D deficiency 11/17/2012  . Degenerative joint disease of cervical spine 10/20/2011  . Shoulder pain 10/20/2011  . Hypertension 05/13/2010  . Hypercholesteremia 05/13/2010  . Restless leg syndrome 05/13/2010  . Osteoporosis 05/13/2010   - Medications: reviewed and updated   Objective:   Physical Exam BP (!) 163/75   Pulse 86   Temp 98.3 F (36.8 C) (Oral)   Wt 165 lb (74.8 kg)   SpO2 93%   BMI 27.46 kg/m  Gen: NAD, alert, cooperative with exam HEENT: NCAT, PERRL,  clear conjunctiva, oropharynx clear, supple neck, frontal sinuses and upper teeth are tender to palpation, R > L CV: irregular rhythm, normal rate, no murmur Resp: CTABL, no wheezes, non-labored Psych: good insight, alert and oriented        Assessment & Plan:   Acute non-recurrent frontal sinusitis Counseled patient that the vast majority of sinus infections are due to viruses, so advised patient to use a nasal saline spray (patient has broken out after using Flonase in the past), Tylenol 3-4 times per day, and hot tea with honey for symptom relief.  Also advised her to continue Claritin.  Plan is to start amoxicillin only if she has had no improvement in the next 3 days due to patient's age and preference.  Advised her that amoxicillin can cause diarrhea, and studies have shown that most bacterial and viral sinus infections will resolve on their own in 2 weeks.    Cardiac arrhythmia Although EKG was performed in August 2019 with sinus arrhythmia visualized, repeated EKG today given patient's acute illness, age, and arrhythmia auscultated on exam.  EKG was reassuring with P waves visualized before every QRS and was consistent with sinus arrhythmia.    Maia Breslow, M.D. 02/22/2018, 11:09 AM PGY-2, [place]

## 2018-02-22 NOTE — Assessment & Plan Note (Signed)
Although EKG was performed in August 2019 with sinus arrhythmia visualized, repeated EKG today given patient's acute illness, age, and arrhythmia auscultated on exam.  EKG was reassuring with P waves visualized before every QRS and was consistent with sinus arrhythmia.

## 2018-02-22 NOTE — Patient Instructions (Addendum)
It was nice meeting you today Ms. Gina Bishop!  Your sinus infection is likely viral, although there is a small chance that it is bacterial.  I would like for you to try Tylenol 3-4 times per day, hot tea with honey, and saline nose spray for relief of your symptoms first.  Please also continue taking Claritin.  If your symptoms have not improved after 3 more days, you can pick up the prescription for amoxicillin, which she will take 2 times per day for 5 days.  This medication may give you diarrhea, and you will need to be seen if your diarrhea becomes a concern.  I hope you feel better soon!  If you have any questions or concerns, please feel free to call the clinic.   Be well,  Dr. Shan Levans  Sinusitis, Adult Sinusitis is inflammation of your sinuses. Sinuses are hollow spaces in the bones around your face. Your sinuses are located:  Around your eyes.  In the middle of your forehead.  Behind your nose.  In your cheekbones. Mucus normally drains out of your sinuses. When your nasal tissues become inflamed or swollen, mucus can become trapped or blocked. This allows bacteria, viruses, and fungi to grow, which leads to infection. Most infections of the sinuses are caused by a virus. Sinusitis can develop quickly. It can last for up to 4 weeks (acute) or for more than 12 weeks (chronic). Sinusitis often develops after a cold. What are the causes? This condition is caused by anything that creates swelling in the sinuses or stops mucus from draining. This includes:  Allergies.  Asthma.  Infection from bacteria or viruses.  Deformities or blockages in your nose or sinuses.  Abnormal growths in the nose (nasal polyps).  Pollutants, such as chemicals or irritants in the air.  Infection from fungi (rare). What increases the risk? You are more likely to develop this condition if you:  Have a weak body defense system (immune system).  Do a lot of swimming or diving.  Overuse nasal  sprays.  Smoke. What are the signs or symptoms? The main symptoms of this condition are pain and a feeling of pressure around the affected sinuses. Other symptoms include:  Stuffy nose or congestion.  Thick drainage from your nose.  Swelling and warmth over the affected sinuses.  Headache.  Upper toothache.  A cough that may get worse at night.  Extra mucus that collects in the throat or the back of the nose (postnasal drip).  Decreased sense of smell and taste.  Fatigue.  A fever.  Sore throat.  Bad breath. How is this diagnosed? This condition is diagnosed based on:  Your symptoms.  Your medical history.  A physical exam.  Tests to find out if your condition is acute or chronic. This may include: ? Checking your nose for nasal polyps. ? Viewing your sinuses using a device that has a light (endoscope). ? Testing for allergies or bacteria. ? Imaging tests, such as an MRI or CT scan. In rare cases, a bone biopsy may be done to rule out more serious types of fungal sinus disease. How is this treated? Treatment for sinusitis depends on the cause and whether your condition is chronic or acute.  If caused by a virus, your symptoms should go away on their own within 10 days. You may be given medicines to relieve symptoms. They include: ? Medicines that shrink swollen nasal passages (topical intranasal decongestants). ? Medicines that treat allergies (antihistamines). ? A spray that  eases inflammation of the nostrils (topical intranasal corticosteroids). ? Rinses that help get rid of thick mucus in your nose (nasal saline washes).  If caused by bacteria, your health care provider may recommend waiting to see if your symptoms improve. Most bacterial infections will get better without antibiotic medicine. You may be given antibiotics if you have: ? A severe infection. ? A weak immune system.  If caused by narrow nasal passages or nasal polyps, you may need to have  surgery. Follow these instructions at home: Medicines  Take, use, or apply over-the-counter and prescription medicines only as told by your health care provider. These may include nasal sprays.  If you were prescribed an antibiotic medicine, take it as told by your health care provider. Do not stop taking the antibiotic even if you start to feel better. Hydrate and humidify   Drink enough fluid to keep your urine pale yellow. Staying hydrated will help to thin your mucus.  Use a cool mist humidifier to keep the humidity level in your home above 50%.  Inhale steam for 10-15 minutes, 3-4 times a day, or as told by your health care provider. You can do this in the bathroom while a hot shower is running.  Limit your exposure to cool or dry air. Rest  Rest as much as possible.  Sleep with your head raised (elevated).  Make sure you get enough sleep each night. General instructions   Apply a warm, moist washcloth to your face 3-4 times a day or as told by your health care provider. This will help with discomfort.  Wash your hands often with soap and water to reduce your exposure to germs. If soap and water are not available, use hand sanitizer.  Do not smoke. Avoid being around people who are smoking (secondhand smoke).  Keep all follow-up visits as told by your health care provider. This is important. Contact a health care provider if:  You have a fever.  Your symptoms get worse.  Your symptoms do not improve within 10 days. Get help right away if:  You have a severe headache.  You have persistent vomiting.  You have severe pain or swelling around your face or eyes.  You have vision problems.  You develop confusion.  Your neck is stiff.  You have trouble breathing. Summary  Sinusitis is soreness and inflammation of your sinuses. Sinuses are hollow spaces in the bones around your face.  This condition is caused by nasal tissues that become inflamed or swollen.  The swelling traps or blocks the flow of mucus. This allows bacteria, viruses, and fungi to grow, which leads to infection.  If you were prescribed an antibiotic medicine, take it as told by your health care provider. Do not stop taking the antibiotic even if you start to feel better.  Keep all follow-up visits as told by your health care provider. This is important. This information is not intended to replace advice given to you by your health care provider. Make sure you discuss any questions you have with your health care provider. Document Released: 02/01/2005 Document Revised: 07/04/2017 Document Reviewed: 07/04/2017 Elsevier Interactive Patient Education  2019 [employer].

## 2018-02-22 NOTE — Assessment & Plan Note (Signed)
Counseled patient that the vast majority of sinus infections are due to viruses, so advised patient to use a nasal saline spray (patient has broken out after using Flonase in the past), Tylenol 3-4 times per day, and hot tea with honey for symptom relief.  Also advised her to continue Claritin.  Plan is to start amoxicillin only if she has had no improvement in the next 3 days due to patient's age and preference.  Advised her that amoxicillin can cause diarrhea, and studies have shown that most bacterial and viral sinus infections will resolve on their own in 2 weeks.

## 2018-03-18 DIAGNOSIS — R197 Diarrhea, unspecified: Secondary | ICD-10-CM | POA: Diagnosis not present

## 2018-06-12 DIAGNOSIS — M25552 Pain in left hip: Secondary | ICD-10-CM | POA: Diagnosis not present

## 2018-06-12 DIAGNOSIS — M25522 Pain in left elbow: Secondary | ICD-10-CM | POA: Diagnosis not present

## 2018-06-12 DIAGNOSIS — S52022A Displaced fracture of olecranon process without intraarticular extension of left ulna, initial encounter for closed fracture: Secondary | ICD-10-CM | POA: Diagnosis not present

## 2018-06-12 DIAGNOSIS — S7002XA Contusion of left hip, initial encounter: Secondary | ICD-10-CM | POA: Diagnosis not present

## 2018-06-15 DIAGNOSIS — Z01812 Encounter for preprocedural laboratory examination: Secondary | ICD-10-CM | POA: Diagnosis not present

## 2018-06-15 DIAGNOSIS — Z1159 Encounter for screening for other viral diseases: Secondary | ICD-10-CM | POA: Diagnosis not present

## 2018-06-21 DIAGNOSIS — S52022A Displaced fracture of olecranon process without intraarticular extension of left ulna, initial encounter for closed fracture: Secondary | ICD-10-CM | POA: Diagnosis not present

## 2018-06-21 DIAGNOSIS — G8918 Other acute postprocedural pain: Secondary | ICD-10-CM | POA: Diagnosis not present

## 2018-06-27 DIAGNOSIS — M25522 Pain in left elbow: Secondary | ICD-10-CM | POA: Diagnosis not present

## 2018-06-29 DIAGNOSIS — M5416 Radiculopathy, lumbar region: Secondary | ICD-10-CM | POA: Diagnosis not present

## 2018-06-29 DIAGNOSIS — M4150 Other secondary scoliosis, site unspecified: Secondary | ICD-10-CM | POA: Diagnosis not present

## 2018-06-29 DIAGNOSIS — M545 Low back pain: Secondary | ICD-10-CM | POA: Diagnosis not present

## 2018-07-15 ENCOUNTER — Other Ambulatory Visit: Payer: Self-pay | Admitting: Family Medicine

## 2018-07-15 DIAGNOSIS — I10 Essential (primary) hypertension: Secondary | ICD-10-CM

## 2018-07-18 ENCOUNTER — Other Ambulatory Visit: Payer: Self-pay

## 2018-07-18 DIAGNOSIS — I10 Essential (primary) hypertension: Secondary | ICD-10-CM

## 2018-07-18 MED ORDER — BENAZEPRIL HCL 10 MG PO TABS: 10.0000 mg | ORAL_TABLET | Freq: Every day | ORAL | 3 refills | Status: DC

## 2018-07-25 DIAGNOSIS — M5416 Radiculopathy, lumbar region: Secondary | ICD-10-CM | POA: Diagnosis not present

## 2018-07-25 DIAGNOSIS — Z4789 Encounter for other orthopedic aftercare: Secondary | ICD-10-CM | POA: Diagnosis not present

## 2018-07-26 DIAGNOSIS — H524 Presbyopia: Secondary | ICD-10-CM | POA: Diagnosis not present

## 2018-07-26 DIAGNOSIS — H5213 Myopia, bilateral: Secondary | ICD-10-CM | POA: Diagnosis not present

## 2018-07-26 DIAGNOSIS — H04123 Dry eye syndrome of bilateral lacrimal glands: Secondary | ICD-10-CM | POA: Diagnosis not present

## 2018-07-26 DIAGNOSIS — H43813 Vitreous degeneration, bilateral: Secondary | ICD-10-CM | POA: Diagnosis not present

## 2018-07-26 DIAGNOSIS — D3131 Benign neoplasm of right choroid: Secondary | ICD-10-CM | POA: Diagnosis not present

## 2018-07-26 DIAGNOSIS — H401131 Primary open-angle glaucoma, bilateral, mild stage: Secondary | ICD-10-CM | POA: Diagnosis not present

## 2018-07-26 DIAGNOSIS — H52223 Regular astigmatism, bilateral: Secondary | ICD-10-CM | POA: Diagnosis not present

## 2018-07-26 DIAGNOSIS — Z961 Presence of intraocular lens: Secondary | ICD-10-CM | POA: Diagnosis not present

## 2018-07-27 ENCOUNTER — Other Ambulatory Visit: Payer: Self-pay

## 2018-07-27 ENCOUNTER — Ambulatory Visit (INDEPENDENT_AMBULATORY_CARE_PROVIDER_SITE_OTHER): Payer: Medicare Other | Admitting: Family Medicine

## 2018-07-27 ENCOUNTER — Encounter: Payer: Self-pay | Admitting: Family Medicine

## 2018-07-27 ENCOUNTER — Ambulatory Visit (HOSPITAL_COMMUNITY)
Admission: RE | Admit: 2018-07-27 | Discharge: 2018-07-27 | Disposition: A | Discharge status: Home / Self Care | Payer: Medicare Other | Source: Ambulatory Visit | Attending: Family Medicine | Admitting: Family Medicine

## 2018-07-27 VITALS — BP 144/60 | HR 64 | Wt 158.6 lb

## 2018-07-27 DIAGNOSIS — I10 Essential (primary) hypertension: Secondary | ICD-10-CM | POA: Diagnosis not present

## 2018-07-27 DIAGNOSIS — I499 Cardiac arrhythmia, unspecified: Secondary | ICD-10-CM | POA: Insufficient documentation

## 2018-07-27 DIAGNOSIS — R296 Repeated falls: Secondary | ICD-10-CM

## 2018-07-27 DIAGNOSIS — F4320 Adjustment disorder, unspecified: Secondary | ICD-10-CM

## 2018-07-27 NOTE — Patient Instructions (Signed)
I am not sure why you are passing out.  The two possibilities are: 1. Your blood pressure drops when you are not in the office.   2. Your heart beat irregularity is worse than what I see on your current EKG.  Step 1 Follow up in our office in 1-2 weeks to see Dr. Valentina Lucks for 24 hour blood pressure monitor. If that doesn't give answers Step 2 will be referral to cardiology. Make an appointment on your way out

## 2018-07-28 DIAGNOSIS — F4320 Adjustment disorder, unspecified: Secondary | ICD-10-CM | POA: Insufficient documentation

## 2018-07-28 DIAGNOSIS — R296 Repeated falls: Secondary | ICD-10-CM | POA: Insufficient documentation

## 2018-07-28 NOTE — Assessment & Plan Note (Signed)
Good control in office.  Possible over treatment at home.  24 hour BP monitor.

## 2018-07-28 NOTE — Assessment & Plan Note (Signed)
EKG continues to show the marked sinus arrythmia.

## 2018-07-28 NOTE — Assessment & Plan Note (Signed)
I suspect either overtreated hypertension or cardiac arrythmia.  Today BP is great.  May need to accept a higher systolic goal.   24 hour BP monitor.  If that does not give me answers, likely refer to cards for possible event monitor.

## 2018-07-28 NOTE — Progress Notes (Signed)
Established Patient Office Visit  Subjective:  Patient ID: Gina Bishop, female    DOB: [birth year]  Age: 84 y.o. MRN: [id number]  CC:  Chief Complaint  Patient presents with  . Fall    HPI Gina Bishop presents for patient has fallen several times, once fracturing her left elbow, which required surgery and is now healing well.  She is unclear about her falls.  As best we can tell, it is not a mechanical fall from stubbling.  Also not vertigo - she doe snot endorse room spinning.  Seems to be more of a lightheadedness/ very brief loss of conciousness.  Has some, inconsistent lightheadedness upon getting our of bed.  Denies CP or SOB.  Does have a hx of hbp on meds.  No recent change in meds.  She is on multiple meds: metoprolol, benazepril and amlodipine.   Does have hx of marked sinus arrythmia via EKG  Also, husband with dementia.  Now in nursing home.  She cannot visit due to Gina Bishop.  Coping OK but lonely.    Past Medical History:  Diagnosis Date  . Allergy   . Arthritis   . Blood transfusion   . Hyperlipidemia   . Hypertension     Past Surgical History:  Procedure Laterality Date  . ABDOMINAL HYSTERECTOMY    . APPENDECTOMY    . CESAREAN SECTION    . JOINT REPLACEMENT      Family History  Problem Relation Age of Onset  . Kidney disease Mother   . Cancer Mother        Kidney  . Heart disease Father   . Cancer Brother        Colon  . Diabetes Son        T1DM  . Rosacea Son       Outpatient Medications Prior to Visit  Medication Sig Dispense Refill  . amLODipine (NORVASC) 5 MG tablet Take 5 mg by mouth daily.    . benazepril (LOTENSIN) 10 MG tablet Take 1 tablet (10 mg total) by mouth daily. 90 tablet 3  . fish oil-omega-3 fatty acids 1000 MG capsule Take 1 g by mouth daily.      Marland Kitchen gabapentin (NEURONTIN) 100 MG capsule TAKE 1 CAPSULE(100 MG) BY MOUTH AT BEDTIME 90 capsule 3  . loratadine (CLARITIN) 10 MG tablet Take 1 tablet (10 mg total) by mouth daily. 90  tablet 3  . meloxicam (MOBIC) 15 MG tablet Take 15 mg by mouth daily.    . metoprolol succinate (TOPROL-XL) 25 MG 24 hr tablet TAKE 1 TABLET BY MOUTH EVERY DAY 90 tablet 3  . pramipexole (MIRAPEX) 0.5 MG tablet TAKE 1 TABLET(0.5 MG) BY MOUTH AT BEDTIME 90 tablet 3  . pravastatin (PRAVACHOL) 40 MG tablet TAKE 1 TABLET(40 MG) BY MOUTH DAILY 90 tablet 0  . aspirin 81 MG tablet Take 1 tablet (81 mg total) by mouth daily. 30 tablet   . alendronate (FOSAMAX) 70 MG tablet TAKE 1 TABLET BY MOUTH EVERY WEEK ON AN EMPTY STOMACH 30 TO 60 MINUTES BEFORE BREAKFAST WITH 8 TO 12 OUNCES OF WATER/ DO NOT LIE DOWN (Patient not taking: Reported on 11/02/2016) 12 tablet 3  . amLODipine (NORVASC) 10 MG tablet TAKE 1 TABLET BY MOUTH DAILY 90 tablet 3  . amoxicillin (AMOXIL) 875 MG tablet Take 1 tablet (875 mg total) by mouth 2 (two) times daily. 10 tablet 0  . dicyclomine (BENTYL) 10 MG capsule Take 1 capsule (10 mg total) by mouth 3 (three)  times daily as needed for spasms. 60 capsule 6  . ketoconazole (NIZORAL) 2 % cream Apply 1 application topically 2 (two) times daily. To affect area. For 1 week 60 g 1  . meclizine (ANTIVERT) 25 MG tablet Take 1 tablet (25 mg total) by mouth 3 (three) times daily as needed for dizziness. (Patient not taking: Reported on 11/02/2016) 30 tablet 0   No facility-administered medications prior to visit.     Allergies  Allergen Reactions  . Fish Allergy   . Orange Juice [Orange Oil]   . Strawberry Extract   . Erythromycin Diarrhea  . Hydrochlorothiazide     Hyponatremia while on HCTZ  . Vicodin [Hydrocodone-Acetaminophen]     hallucinatations and nightmares    ROS Review of Systems    Objective:    Physical Exam  BP (!) 144/60   Pulse 64   Wt 158 lb 9.6 oz (71.9 kg)   SpO2 94%   BMI 26.39 kg/m  Wt Readings from Last 3 Encounters:  07/27/18 158 lb 9.6 oz (71.9 kg)  02/22/18 165 lb (74.8 kg)  09/26/17 162 lb 9.6 oz (73.8 kg)   Very active, spry 85 yo.  Does not  seem to have any deconditioning or muscle weakness  Lungs clear Cardiac Irregular heart beat.  No m or g Ext 1+ bilateral edema. Psych normal mentation.  Upbeat affect, although did get teary at one point talking about her husband.  There are no preventive care reminders to display for this patient.  There are no preventive care reminders to display for this patient.  Lab Results  Component Value Date   TSH 2.825 11/28/2014   Lab Results  Component Value Date   WBC 7.8 09/22/2017   HGB 13.0 09/22/2017   HCT 39.0 09/22/2017   MCV 90 09/22/2017   PLT 225 09/22/2017   Lab Results  Component Value Date   NA 137 10/14/2017   K 5.1 10/14/2017   CO2 22 10/14/2017   GLUCOSE 93 10/14/2017   BUN 21 10/14/2017   CREATININE 0.82 10/14/2017   BILITOT 0.5 09/22/2017   ALKPHOS 74 09/22/2017   AST 24 09/22/2017   ALT 14 09/22/2017   PROT 7.1 09/22/2017   ALBUMIN 4.8 (H) 09/22/2017   CALCIUM 9.5 10/14/2017   Lab Results  Component Value Date   CHOL 212 (H) 01/20/2017   Lab Results  Component Value Date   HDL 62 01/20/2017   Lab Results  Component Value Date   LDLCALC 122 (H) 01/20/2017   Lab Results  Component Value Date   TRIG 138 01/20/2017   Lab Results  Component Value Date   CHOLHDL 3.4 01/20/2017   No results found for: HGBA1C    Assessment & Plan:   Problem List Items Addressed This Visit    Cardiac arrhythmia - Primary   Relevant Medications   amLODipine (NORVASC) 5 MG tablet   Other Relevant Orders   EKG 12-Lead (Completed)      No orders of the defined types were placed in this encounter.   Follow-up: Return in about 1 week (around 08/03/2018) for Dr. Valentina Lucks for 24 hour blood pressure monitor.[name], MD

## 2018-07-28 NOTE — Assessment & Plan Note (Signed)
Seems to be making the hard emotional adjustments for living alone.

## 2018-08-01 DIAGNOSIS — S52022D Displaced fracture of olecranon process without intraarticular extension of left ulna, subsequent encounter for closed fracture with routine healing: Secondary | ICD-10-CM | POA: Diagnosis not present

## 2018-08-03 ENCOUNTER — Ambulatory Visit (INDEPENDENT_AMBULATORY_CARE_PROVIDER_SITE_OTHER): Payer: Medicare Other | Admitting: Pharmacist

## 2018-08-03 ENCOUNTER — Other Ambulatory Visit: Payer: Self-pay

## 2018-08-03 ENCOUNTER — Encounter: Payer: Self-pay | Admitting: Pharmacist

## 2018-08-03 DIAGNOSIS — I10 Essential (primary) hypertension: Secondary | ICD-10-CM

## 2018-08-03 NOTE — Progress Notes (Signed)
   S:    Patient arrives in good spirits, ambulating without assistance. Favoring her left arm/shoulder after recent surgery. Presents to the clinic for ambulatory blood pressure evaluation.  Patient was referred on 07/27/2018 by primary care provider Dr. Andria Frames. Referred for amb BP monitoring to determine if hypotensive throughout the day, as BP generally well controlled at office.   Patient suffered a fall earlier this year. She denies "dizziness", but describes it as "more so unsteadiness".   Medication compliance is reported to be good.  Discussed procedure for wearing the monitor and gave patient written instructions. Monitor was placed on non-dominant arm with instructions to return in the morning.   Current BP Medications include:  Amlodipine 10 mg QAM, benazepril 10 mg QAM, metoprolol XL 100 mg QAM   O:   Physical Exam Vitals signs reviewed.  Constitutional:      Appearance: Normal appearance.  Neurological:     Mental Status: She is alert.    Review of Systems  Cardiovascular: Positive for leg swelling (1+ pitting edema, L>R).       Worsens throughout the day    Last 3 Office BP readings: BP Readings from Last 3 Encounters:  07/27/18 (!) 144/60  02/22/18 (!) 163/75  09/26/17 (!) 142/72    Clinical ASCVD: No   Office BP reading: 132/78 mmHg (manual reading on 08/03/18)  ABPM Study Data: Ambulatory BP monitor evaluated on 08/04/18.  Arm Placement left arm   Overall Mean 24hr BP:   131/69 mmHg HR: 67   Daytime Mean BP:  134/73 mmHg HR: 72   Nighttime Mean BP:  125/60 mmHg HR: 56   Dipping Pattern: No.  Sys:   6.1%   Dia: 17.8%   [normal dipping ~10-20%]  Non-hypertensive ABPM thresholds: daytime BP <125/75 mmHg, sleeptime BP <120/70 mmHg    A/P:  Hypertension Found to have mild isolated systolic hypertension given 24-hour ambulatory blood pressure demonstrating systolic hypertension (226 mmHg) with normal diastolic pressure (69 mmHg); avg BP of 131/69  mmHg, and a nocturnal dipping pattern that is abnormal with asleep systolic hypertension (333 mmHg) and normal asleep diastolic pressure (60 mmHg). Elevated HR (>90) from 1300-1500. Patient stated that she received news of her friend passing away during that time.  -Reduce amlodipine from 10 mg to 5 mg daily d/t results of Amb BP monitoring results, potential peripheral edema with agent and hope to lessen symptoms of "unsteddiness".  Reviewed need for slower transitions from lying to standing including counting to 10 while seated if arising from sleep during the night. Instructed patient to take amlodipine at bedtime to maximize dipping effect and minimize awake symptoms potentially.  -Continue benazepril 10 mg daily -Continue metoprolol XL 100 mg daily   Results reviewed and written information provided.  Total time in face-to-face counseling 30 minutes.  F/U Clinic Visit as needed with Dr. Andria Frames (likely October during annual visit).    Patient seen with Evangeline Gula PharmD Candidate and Catie Darnelle Maffucci, PharmD, PGY2 Pharmacy Resident

## 2018-08-03 NOTE — Patient Instructions (Addendum)
It was great to see you today!  We have discussed results from your blood pressure monitor and will make the following changes: -Continue the benazepril 10 mg in the morning -Continue the metoprolol  -Cut your amlodipine 10 mg tablets in half. Your new dose is amlodipine 5 mg. Please take this at night.

## 2018-08-04 MED ORDER — AMLODIPINE BESYLATE 5 MG PO TABS: 5.0000 mg | ORAL_TABLET | Freq: Every day | ORAL | 3 refills | Status: DC

## 2018-08-04 NOTE — Assessment & Plan Note (Signed)
Found to have mild isolated systolic hypertension given 24-hour ambulatory blood pressure demonstrating systolic hypertension (030 mmHg) with normal diastolic pressure (69 mmHg); avg BP of 131/69 mmHg, and a nocturnal dipping pattern that is abnormal with asleep systolic hypertension (131 mmHg) and normal asleep diastolic pressure (60 mmHg)

## 2018-08-09 NOTE — Progress Notes (Signed)
Patient ID: Gina Bishop, female   DOB: 1931-07-03, 85 y.o.   MRN: [id number] Reviewed: I agree with Dr. Graylin Shiver documentation and management.

## 2018-08-09 NOTE — Progress Notes (Signed)
Patient ID: Gina Bishop, female   DOB: 1931/02/28, 85 y.o.   MRN: [id number] Reviewed: I agree with Dr. Graylin Shiver documentation and management.

## 2018-08-10 DIAGNOSIS — S52022D Displaced fracture of olecranon process without intraarticular extension of left ulna, subsequent encounter for closed fracture with routine healing: Secondary | ICD-10-CM | POA: Diagnosis not present

## 2018-08-11 ENCOUNTER — Other Ambulatory Visit: Payer: Self-pay

## 2018-08-11 ENCOUNTER — Emergency Department (HOSPITAL_BASED_OUTPATIENT_CLINIC_OR_DEPARTMENT_OTHER)
Admission: EM | Admit: 2018-08-11 | Discharge: 2018-08-11 | Disposition: A | Discharge status: Home / Self Care | Payer: Medicare Other | Attending: Emergency Medicine | Admitting: Emergency Medicine

## 2018-08-11 ENCOUNTER — Encounter (HOSPITAL_BASED_OUTPATIENT_CLINIC_OR_DEPARTMENT_OTHER): Payer: Self-pay | Admitting: *Deleted

## 2018-08-11 DIAGNOSIS — Z79899 Other long term (current) drug therapy: Secondary | ICD-10-CM | POA: Diagnosis not present

## 2018-08-11 DIAGNOSIS — M5442 Lumbago with sciatica, left side: Secondary | ICD-10-CM | POA: Diagnosis not present

## 2018-08-11 DIAGNOSIS — I10 Essential (primary) hypertension: Secondary | ICD-10-CM | POA: Diagnosis not present

## 2018-08-11 DIAGNOSIS — M545 Low back pain: Secondary | ICD-10-CM | POA: Diagnosis present

## 2018-08-11 HISTORY — DX: Scoliosis, unspecified: M41.9

## 2018-08-11 MED ORDER — TRAMADOL HCL 50 MG PO TABS: 50.0000 mg | ORAL_TABLET | Freq: Two times a day (BID) | ORAL | 0 refills | Status: DC | PRN

## 2018-08-11 MED ORDER — DEXAMETHASONE 4 MG PO TABS: 4.0000 mg | ORAL_TABLET | Freq: Two times a day (BID) | ORAL | 0 refills | Status: DC

## 2018-08-11 MED ORDER — DEXAMETHASONE 4 MG PO TABS
8.0000 mg | ORAL_TABLET | Freq: Once | ORAL | Status: AC
  Administered 2018-08-11: 8 mg via ORAL
  Filled 2018-08-11: qty 2

## 2018-08-11 NOTE — ED Triage Notes (Signed)
[facility] on April 28 had surgery left elbow,  Today complains of cont back pain radiating down legs  Taking mobic without relief

## 2018-08-13 NOTE — ED Provider Notes (Signed)
[facility] Provider Note   CSN: [id number] Arrival date & time: 08/11/18  0800     History   Chief Complaint Chief Complaint  Patient presents with  . Back Pain    HPI Gina Bishop is a 85 y.o. female.     HPI   85 year old female with back pain.  Lower back.  Does not lateralize.  Onset yesterday.  No acute trauma or strain.  Radiates down into both legs.  Worse with movement.  Has been taking Mobic with only very mild improvement.  No acute urinary complaints.  Past Medical History:  Diagnosis Date  . Allergy   . Arthritis   . Blood transfusion   . Hyperlipidemia   . Hypertension   . Scoliosis     Patient Active Problem List   Diagnosis Date Noted  . Frequent falls 07/28/2018  . Adjustment reaction 07/28/2018  . Acute non-recurrent frontal sinusitis 02/22/2018  . Seasonal allergic rhinitis due to pollen 09/26/2017  . Orthostatic hypotension 09/22/2017  . Vertigo, labyrinthine 05/15/2015  . Cardiac arrhythmia 05/08/2015  . Hyponatremia 05/08/2015  . Solitary pulmonary nodule 05/08/2015  . Routine general medical examination at a health care facility 11/22/2013  . Vitamin D deficiency 11/17/2012  . Degenerative joint disease of cervical spine 10/20/2011  . Shoulder pain 10/20/2011  . Hypertension 05/13/2010  . Hypercholesteremia 05/13/2010  . Restless leg syndrome 05/13/2010  . Osteoporosis 05/13/2010    Past Surgical History:  Procedure Laterality Date  . ABDOMINAL HYSTERECTOMY    . APPENDECTOMY    . CESAREAN SECTION    . JOINT REPLACEMENT       OB History   No obstetric history on file.      Home Medications    Prior to Admission medications   Medication Sig Start Date End Date Taking? Authorizing Provider  amLODipine (NORVASC) 5 MG tablet Take 1 tablet (5 mg total) by mouth at bedtime. 08/04/18   [name], MD  benazepril (LOTENSIN) 10 MG tablet Take 1 tablet (10 mg total) by mouth daily. 07/18/18    [name], MD  dexamethasone (DECADRON) 4 MG tablet Take 1 tablet (4 mg total) by mouth 2 (two) times daily with a meal. 08/11/18   Virgel Manifold, MD  fish oil-omega-3 fatty acids 1000 MG capsule Take 1 g by mouth daily.      [provider]  gabapentin (NEURONTIN) 100 MG capsule TAKE 1 CAPSULE(100 MG) BY MOUTH AT BEDTIME 11/21/17   [name], MD  loratadine (CLARITIN) 10 MG tablet Take 1 tablet (10 mg total) by mouth daily. 09/26/17   Martyn Malay, MD  meloxicam (MOBIC) 15 MG tablet Take 15 mg by mouth daily.    [provider]  metoprolol succinate (TOPROL-XL) 25 MG 24 hr tablet TAKE 1 TABLET BY MOUTH EVERY DAY 11/21/17   [name], MD  pramipexole (MIRAPEX) 0.5 MG tablet TAKE 1 TABLET(0.5 MG) BY MOUTH AT BEDTIME 01/09/18   [name], MD  pravastatin (PRAVACHOL) 40 MG tablet TAKE 1 TABLET(40 MG) BY MOUTH DAILY 07/18/18   Leeanne Rio, MD  traMADol (ULTRAM) 50 MG tablet Take 1 tablet (50 mg total) by mouth every 12 (twelve) hours as needed. 08/11/18   Virgel Manifold, MD    Family History Family History  Problem Relation Age of Onset  . Kidney disease Mother   . Cancer Mother        Kidney  . Heart disease Father   .  Cancer Brother        Colon  . Diabetes Son        T1DM  . Rosacea Son     Social History Social History   Tobacco Use  . Smoking status: Never Smoker  . Smokeless tobacco: Never Used  Substance Use Topics  . Alcohol use: No  . Drug use: No     Allergies   Fish allergy, Orange juice [orange oil], Strawberry extract, Vicodin [hydrocodone-acetaminophen], Erythromycin, Amoxicillin, and Hydrochlorothiazide   Review of Systems Review of Systems  All systems reviewed and negative, other than as noted in HPI.  Physical Exam Updated Vital Signs BP (!) 170/85 (BP Location: Left Arm)   Pulse 60   Temp 98.8 F (37.1 C)   Resp 18   Ht 5\' 5"  (1.651 m)   Wt 71.7 kg   BMI 26.29 kg/m   Physical Exam Vitals  signs and nursing note reviewed.  Constitutional:      General: She is not in acute distress.    Appearance: She is well-developed.  HENT:     Head: Normocephalic and atraumatic.  Eyes:     General:        Right eye: No discharge.        Left eye: No discharge.     Conjunctiva/sclera: Conjunctivae normal.  Neck:     Musculoskeletal: Neck supple.  Cardiovascular:     Rate and Rhythm: Normal rate and regular rhythm.     Heart sounds: Normal heart sounds. No murmur. No friction rub. No gallop.   Pulmonary:     Effort: Pulmonary effort is normal. No respiratory distress.     Breath sounds: Normal breath sounds.  Abdominal:     General: There is no distension.     Palpations: Abdomen is soft.     Tenderness: There is no abdominal tenderness.  Musculoskeletal:        General: No tenderness.     Comments: Back pain is not reproducible palpation.  Is reproducible with straight leg test though.  Normal strength bilateral lower extremities.  Sensation intact to light touch.  Skin:    General: Skin is warm and dry.  Neurological:     Mental Status: She is alert.  Psychiatric:        Behavior: Behavior normal.        Thought Content: Thought content normal.      ED Treatments / Results  Labs (all labs ordered are listed, but only abnormal results are displayed) Labs Reviewed - No data to display  EKG    Radiology No results found.  Procedures Procedures (including critical care time)  Medications Ordered in ED Medications  dexamethasone (DECADRON) tablet 8 mg (8 mg Oral Given 08/11/18 0856)     Initial Impression / Assessment and Plan / ED Course  I have reviewed the triage vital signs and the nursing notes.  Pertinent labs & imaging results that were available during my care of the patient were reviewed by me and considered in my medical decision making (see chart for details).        85 year old female with radicular lower back pain.  Neuro exam is nonfocal.   Plan symptomatic treatment.  Return precautions discussed.  Outpatient follow-up otherwise.  Final Clinical Impressions(s) / ED Diagnoses   Final diagnoses:  Acute left-sided low back pain with left-sided sciatica    ED Discharge Orders         Ordered    dexamethasone (DECADRON) 4  MG tablet  2 times daily with meals     08/11/18 0856    traMADol (ULTRAM) 50 MG tablet  Every 12 hours PRN     08/11/18 0856           Virgel Manifold, MD 08/13/18 2152

## 2018-08-14 DIAGNOSIS — S52022D Displaced fracture of olecranon process without intraarticular extension of left ulna, subsequent encounter for closed fracture with routine healing: Secondary | ICD-10-CM | POA: Diagnosis not present

## 2018-08-16 ENCOUNTER — Telehealth (INDEPENDENT_AMBULATORY_CARE_PROVIDER_SITE_OTHER): Payer: Medicare Other | Admitting: Family Medicine

## 2018-08-16 DIAGNOSIS — M79605 Pain in left leg: Secondary | ICD-10-CM

## 2018-08-16 DIAGNOSIS — M545 Low back pain, unspecified: Secondary | ICD-10-CM

## 2018-08-16 MED ORDER — TRAMADOL HCL 50 MG PO TABS: 50.0000 mg | ORAL_TABLET | Freq: Two times a day (BID) | ORAL | 0 refills | Status: DC | PRN

## 2018-08-16 MED ORDER — GABAPENTIN 100 MG PO CAPS: 100.0000 mg | ORAL_CAPSULE | Freq: Three times a day (TID) | ORAL | 3 refills | Status: DC

## 2018-08-16 NOTE — Assessment & Plan Note (Signed)
I am worried about radiculopathy and about acute compression fracture.  Last BMD was borderline between osteopenia and osteoporosis.  She is at risk for compression fx.  Will check x-rays.

## 2018-08-16 NOTE — Progress Notes (Signed)
Patient called me on my cell earlier today.  We agreed to a call back when I could access chart. Agrees to a televisit. Low back pain with left leg radiculopathy.  Seen in ER on 6/26.  Was doing well then had acute onset of severe pain.  No bowel or bladder problem.  Pain is left leg only, not bilateral.  No saddle anesthesia.  Reviewed last BMD which was 2014 and borderline between osteoporosis and osteopenia.  Called because not improved and now out of tramadol.   Previously taking gabapentin 100 mg qhs. Not too sleepy on this dose.  See problem charting.  Will get xrays, refill tramadol and increase gabapentin.  I will check on her response when I call with x ray results.  Duration of phone call 15 minutes.

## 2018-08-17 ENCOUNTER — Ambulatory Visit (HOSPITAL_BASED_OUTPATIENT_CLINIC_OR_DEPARTMENT_OTHER)
Admission: RE | Admit: 2018-08-17 | Discharge: 2018-08-17 | Disposition: A | Discharge status: Home / Self Care | Payer: Medicare Other | Source: Ambulatory Visit | Attending: Family Medicine | Admitting: Family Medicine

## 2018-08-17 ENCOUNTER — Other Ambulatory Visit: Payer: Self-pay

## 2018-08-17 DIAGNOSIS — M5136 Other intervertebral disc degeneration, lumbar region: Secondary | ICD-10-CM | POA: Diagnosis not present

## 2018-08-17 DIAGNOSIS — M79605 Pain in left leg: Secondary | ICD-10-CM | POA: Insufficient documentation

## 2018-08-17 DIAGNOSIS — M545 Low back pain: Secondary | ICD-10-CM | POA: Diagnosis not present

## 2018-08-17 DIAGNOSIS — M47816 Spondylosis without myelopathy or radiculopathy, lumbar region: Secondary | ICD-10-CM | POA: Diagnosis not present

## 2018-08-29 DIAGNOSIS — M5416 Radiculopathy, lumbar region: Secondary | ICD-10-CM | POA: Diagnosis not present

## 2018-09-08 ENCOUNTER — Telehealth: Payer: Self-pay | Admitting: *Deleted

## 2018-09-08 NOTE — Telephone Encounter (Signed)
Pt calls because pharmacy never received the script for increased dose of gabapentin (100mg  TID).  Upon review noticed that it was set to No print.  Called script in verbally. Christen Bame, CMA

## 2018-09-11 NOTE — Telephone Encounter (Signed)
Thank you and I apologize for my error.

## 2018-09-15 DIAGNOSIS — M5416 Radiculopathy, lumbar region: Secondary | ICD-10-CM | POA: Diagnosis not present

## 2018-09-18 DIAGNOSIS — M5416 Radiculopathy, lumbar region: Secondary | ICD-10-CM | POA: Diagnosis not present

## 2018-09-19 ENCOUNTER — Other Ambulatory Visit: Payer: Self-pay | Admitting: Family Medicine

## 2018-09-19 DIAGNOSIS — I10 Essential (primary) hypertension: Secondary | ICD-10-CM

## 2018-09-21 DIAGNOSIS — M5416 Radiculopathy, lumbar region: Secondary | ICD-10-CM | POA: Diagnosis not present

## 2018-09-28 DIAGNOSIS — M5416 Radiculopathy, lumbar region: Secondary | ICD-10-CM | POA: Diagnosis not present

## 2018-10-04 ENCOUNTER — Telehealth: Payer: Self-pay | Admitting: *Deleted

## 2018-10-04 DIAGNOSIS — M5416 Radiculopathy, lumbar region: Secondary | ICD-10-CM | POA: Diagnosis not present

## 2018-10-04 NOTE — Telephone Encounter (Signed)
Next Weds is appropriate.

## 2018-10-04 NOTE — Telephone Encounter (Signed)
Pt states that she is having a "bladder leakage" problem and wants to know what Dr. Andria Frames can do.    Virtual appt made for next Wednesday with PCP.  Will also forward note in case appt would need to change. Christen Bame, CMA

## 2018-10-11 ENCOUNTER — Encounter: Payer: Self-pay | Admitting: Family Medicine

## 2018-10-11 ENCOUNTER — Other Ambulatory Visit: Payer: Self-pay

## 2018-10-11 ENCOUNTER — Ambulatory Visit (INDEPENDENT_AMBULATORY_CARE_PROVIDER_SITE_OTHER): Payer: Medicare Other | Admitting: Family Medicine

## 2018-10-11 ENCOUNTER — Telehealth: Payer: Self-pay | Admitting: *Deleted

## 2018-10-11 VITALS — BP 152/70 | HR 71 | Wt 159.4 lb

## 2018-10-11 DIAGNOSIS — R32 Unspecified urinary incontinence: Secondary | ICD-10-CM | POA: Insufficient documentation

## 2018-10-11 DIAGNOSIS — M545 Low back pain, unspecified: Secondary | ICD-10-CM

## 2018-10-11 DIAGNOSIS — R918 Other nonspecific abnormal finding of lung field: Secondary | ICD-10-CM

## 2018-10-11 DIAGNOSIS — R7309 Other abnormal glucose: Secondary | ICD-10-CM

## 2018-10-11 DIAGNOSIS — R739 Hyperglycemia, unspecified: Secondary | ICD-10-CM | POA: Diagnosis not present

## 2018-10-11 DIAGNOSIS — R911 Solitary pulmonary nodule: Secondary | ICD-10-CM | POA: Diagnosis not present

## 2018-10-11 DIAGNOSIS — I10 Essential (primary) hypertension: Secondary | ICD-10-CM

## 2018-10-11 DIAGNOSIS — E7800 Pure hypercholesterolemia, unspecified: Secondary | ICD-10-CM | POA: Diagnosis not present

## 2018-10-11 DIAGNOSIS — I951 Orthostatic hypotension: Secondary | ICD-10-CM | POA: Diagnosis not present

## 2018-10-11 DIAGNOSIS — R296 Repeated falls: Secondary | ICD-10-CM | POA: Diagnosis not present

## 2018-10-11 DIAGNOSIS — M79605 Pain in left leg: Secondary | ICD-10-CM | POA: Diagnosis not present

## 2018-10-11 LAB — POCT URINALYSIS DIP (MANUAL ENTRY)
Blood, UA: NEGATIVE
Glucose, UA: NEGATIVE mg/dL
Ketones, POC UA: NEGATIVE mg/dL
Leukocytes, UA: NEGATIVE
Nitrite, UA: NEGATIVE
Spec Grav, UA: 1.025 (ref 1.010–1.025)
Urobilinogen, UA: 0.2 E.U./dL
pH, UA: 6 (ref 5.0–8.0)

## 2018-10-11 MED ORDER — MIRABEGRON ER 25 MG PO TB24: 25.0000 mg | ORAL_TABLET | Freq: Every day | ORAL | 6 refills | Status: DC

## 2018-10-11 NOTE — Patient Instructions (Signed)
Great to see you. I added a new medication that I hope helps with the nighttime incontinence. I will call tomorrow with blood test results. Someone should call to schedule a CT of you chest as follow up of lung nodules.   Let me know when you get your flu shot. See me in me in six months.

## 2018-10-11 NOTE — Assessment & Plan Note (Signed)
Check labs, continue statin.

## 2018-10-11 NOTE — Assessment & Plan Note (Signed)
Avoid overtreatment

## 2018-10-11 NOTE — Progress Notes (Signed)
Established Patient Office Visit  Subjective:  Patient ID: Gina Bishop, female    DOB: [birth year]  Age: 85 y.o. MRN: [id number]  CC:  Chief Complaint  Patient presents with  . Urinary Incontinence    HPI Gina Bishop presents for Several issues Nighttime urinary incontinence.  Large volume.  No fever, dysuria, urgency or frequency.   Back pain - chronic and stable.  Unrelated to urinary incontinence.  No bowel incontinence.  No saddle anesthesia.  Radiculopathy has resolved.  Currently, no leg pain.  Off tramadol.  Did not like the way it made her feel. Hypertension with orthostasis.  Avoid over treatment.  Stable on meds   No recent labs.  No current lightheadedness. Hypercholesterolemia.  On pravastatin from primary prevention with caveat that has heavy coronary calcification.  No recent labs. Old pulmonary nodules.  Never had FU   Past Medical History:  Diagnosis Date  . Allergy   . Arthritis   . Blood transfusion   . Hyperlipidemia   . Hypertension   . Scoliosis     Past Surgical History:  Procedure Laterality Date  . ABDOMINAL HYSTERECTOMY    . APPENDECTOMY    . CESAREAN SECTION    . JOINT REPLACEMENT      Family History  Problem Relation Age of Onset  . Kidney disease Mother   . Cancer Mother        Kidney  . Heart disease Father   . Cancer Brother        Colon  . Diabetes Son        T1DM  . Rosacea Son       Outpatient Medications Prior to Visit  Medication Sig Dispense Refill  . amLODipine (NORVASC) 5 MG tablet Take 1 tablet (5 mg total) by mouth at bedtime. 90 tablet 3  . benazepril (LOTENSIN) 10 MG tablet Take 1 tablet (10 mg total) by mouth daily. 90 tablet 3  . fish oil-omega-3 fatty acids 1000 MG capsule Take 1 g by mouth daily.      Marland Kitchen gabapentin (NEURONTIN) 100 MG capsule Take 1 capsule (100 mg total) by mouth 3 (three) times daily. 90 capsule 3  . loratadine (CLARITIN) 10 MG tablet Take 1 tablet (10 mg total) by mouth daily. 90 tablet 3  .  meloxicam (MOBIC) 15 MG tablet Take 15 mg by mouth daily.    . metoprolol succinate (TOPROL-XL) 25 MG 24 hr tablet TAKE 1 TABLET BY MOUTH EVERY DAY 90 tablet 3  . pramipexole (MIRAPEX) 0.5 MG tablet TAKE 1 TABLET(0.5 MG) BY MOUTH AT BEDTIME 90 tablet 3  . pravastatin (PRAVACHOL) 40 MG tablet TAKE 1 TABLET(40 MG) BY MOUTH DAILY 90 tablet 3  . traMADol (ULTRAM) 50 MG tablet Take 1 tablet (50 mg total) by mouth every 12 (twelve) hours as needed. 25 tablet 0   No facility-administered medications prior to visit.     Allergies  Allergen Reactions  . Fish Allergy   . Orange Juice [Orange Oil]   . Strawberry Extract   . Vicodin [Hydrocodone-Acetaminophen] Other (See Comments)    hallucinatations and nightmares  . Erythromycin Diarrhea  . Amoxicillin Nausea And Vomiting  . Hydrochlorothiazide Other (See Comments)    Hyponatremia while on HCTZ    ROS Review of Systems    Objective:    Physical Exam  BP (!) 152/70   Pulse 71   Wt 159 lb 6.4 oz (72.3 kg)   SpO2 99%   BMI 26.53 kg/m  Wt  Readings from Last 3 Encounters:  10/11/18 159 lb 6.4 oz (72.3 kg)  08/11/18 158 lb (71.7 kg)  08/03/18 156 lb 3.2 oz (70.9 kg)   2/6 SEM. Lungs clear Abd benign.   Ext 2+ edema.  Health Maintenance Due  Topic Date Due  . INFLUENZA VACCINE  09/16/2018    There are no preventive care reminders to display for this patient.  Lab Results  Component Value Date   TSH 2.825 11/28/2014   Lab Results  Component Value Date   WBC 7.8 09/22/2017   HGB 13.0 09/22/2017   HCT 39.0 09/22/2017   MCV 90 09/22/2017   PLT 225 09/22/2017   Lab Results  Component Value Date   NA 137 10/14/2017   K 5.1 10/14/2017   CO2 22 10/14/2017   GLUCOSE 93 10/14/2017   BUN 21 10/14/2017   CREATININE 0.82 10/14/2017   BILITOT 0.5 09/22/2017   ALKPHOS 74 09/22/2017   AST 24 09/22/2017   ALT 14 09/22/2017   PROT 7.1 09/22/2017   ALBUMIN 4.8 (H) 09/22/2017   CALCIUM 9.5 10/14/2017   Lab Results   Component Value Date   CHOL 212 (H) 01/20/2017   Lab Results  Component Value Date   HDL 62 01/20/2017   Lab Results  Component Value Date   LDLCALC 122 (H) 01/20/2017   Lab Results  Component Value Date   TRIG 138 01/20/2017   Lab Results  Component Value Date   CHOLHDL 3.4 01/20/2017   No results found for: HGBA1C    Assessment & Plan:   Problem List Items Addressed This Visit    Hypercholesteremia   Relevant Orders   Lipid panel   Hypertension   Relevant Orders   CMP14+EGFR   CBC   Incontinence in female   Relevant Medications   mirabegron ER (MYRBETRIQ) 25 MG TB24 tablet   Solitary pulmonary nodule   Relevant Orders   CT Chest Wo Contrast    Other Visit Diagnoses    Urinary incontinence, unspecified type    -  Primary   Relevant Medications   mirabegron ER (MYRBETRIQ) 25 MG TB24 tablet   Other Relevant Orders   POCT urinalysis dipstick (Completed)   Abnormal findings on diagnostic imaging of lung       Relevant Orders   CT Chest Wo Contrast      Meds ordered this encounter  Medications  . mirabegron ER (MYRBETRIQ) 25 MG TB24 tablet    Sig: Take 1 tablet (25 mg total) by mouth daily.    Dispense:  30 tablet    Refill:  6    Follow-up: No follow-ups on file.    [name], MD

## 2018-10-11 NOTE — Assessment & Plan Note (Signed)
Schedule FU CT.

## 2018-10-11 NOTE — Assessment & Plan Note (Signed)
Stable from symptoms and at goal for systolic hypertension.

## 2018-10-11 NOTE — Assessment & Plan Note (Signed)
Trial of myrbetric

## 2018-10-11 NOTE — Telephone Encounter (Signed)
Pt came into the office for her appointment and told her she could ignore the message on her phone. April Zimmerman Rumple, CMA

## 2018-10-11 NOTE — Telephone Encounter (Signed)
LVM to call office back, tried to call prior to her virtual visit with Dr. Andria Frames. April Zimmerman Rumple, CMA

## 2018-10-12 LAB — CMP14+EGFR
ALT: 12 IU/L (ref 0–32)
AST: 18 IU/L (ref 0–40)
Albumin/Globulin Ratio: 2.9 — ABNORMAL HIGH (ref 1.2–2.2)
Albumin: 4.7 g/dL — ABNORMAL HIGH (ref 3.6–4.6)
Alkaline Phosphatase: 111 IU/L (ref 39–117)
BUN/Creatinine Ratio: 31 — ABNORMAL HIGH (ref 12–28)
BUN: 31 mg/dL — ABNORMAL HIGH (ref 8–27)
Bilirubin Total: 0.3 mg/dL (ref 0.0–1.2)
CO2: 22 mmol/L (ref 20–29)
Calcium: 9.5 mg/dL (ref 8.7–10.3)
Chloride: 99 mmol/L (ref 96–106)
Creatinine, Ser: 0.99 mg/dL (ref 0.57–1.00)
GFR calc Af Amer: 61 mL/min/{1.73_m2} (ref >59)
GFR calc non Af Amer: 52 mL/min/{1.73_m2} — ABNORMAL LOW (ref >59)
Globulin, Total: 1.6 g/dL (ref 1.5–4.5)
Glucose: 161 mg/dL — ABNORMAL HIGH (ref 65–99)
Potassium: 4.6 mmol/L (ref 3.5–5.2)
Sodium: 139 mmol/L (ref 134–144)
Total Protein: 6.3 g/dL (ref 6.0–8.5)

## 2018-10-12 LAB — CBC
Hematocrit: 35.5 % (ref 34.0–46.6)
Hemoglobin: 12 g/dL (ref 11.1–15.9)
MCH: 29.1 pg (ref 26.6–33.0)
MCHC: 33.8 g/dL (ref 31.5–35.7)
MCV: 86 fL (ref 79–97)
Platelets: 211 10*3/uL (ref 150–450)
RBC: 4.12 x10E6/uL (ref 3.77–5.28)
RDW: 12.7 % (ref 11.7–15.4)
WBC: 7.6 10*3/uL (ref 3.4–10.8)

## 2018-10-12 LAB — LIPID PANEL
Chol/HDL Ratio: 3.2 ratio (ref 0.0–4.4)
Cholesterol, Total: 196 mg/dL (ref 100–199)
HDL: 61 mg/dL (ref >39)
LDL Calculated: 102 mg/dL — ABNORMAL HIGH (ref 0–99)
Triglycerides: 164 mg/dL — ABNORMAL HIGH (ref 0–149)
VLDL Cholesterol Cal: 33 mg/dL (ref 5–40)

## 2018-10-12 MED ORDER — MELOXICAM 15 MG PO TABS: 7.5000 mg | ORAL_TABLET | Freq: Every day | ORAL | Status: DC

## 2018-10-12 NOTE — Addendum Note (Signed)
Addended by: [name] on: 10/12/2018 09:13 AM   Modules accepted: Orders

## 2018-10-12 NOTE — Addendum Note (Signed)
Addended by: [place] Pink on: 10/12/2018 08:33 AM   Modules accepted: Orders

## 2018-10-13 DIAGNOSIS — R739 Hyperglycemia, unspecified: Secondary | ICD-10-CM | POA: Insufficient documentation

## 2018-10-13 LAB — SPECIMEN STATUS REPORT

## 2018-10-13 LAB — HEMOGLOBIN A1C
Est. average glucose Bld gHb Est-mCnc: 108 mg/dL
Hgb A1c MFr Bld: 5.4 % (ref 4.8–5.6)

## 2018-10-13 MED ORDER — SOLIFENACIN SUCCINATE 5 MG PO TABS: 5.0000 mg | ORAL_TABLET | Freq: Every day | ORAL | 3 refills | Status: DC

## 2018-10-13 NOTE — Addendum Note (Signed)
Addended by: [name] on: 10/13/2018 11:26 AM   Modules accepted: Orders

## 2018-10-13 NOTE — Assessment & Plan Note (Signed)
Seen on non fasting blood draw.  A1C is reassuring.

## 2018-10-19 ENCOUNTER — Other Ambulatory Visit: Payer: Self-pay

## 2018-10-19 ENCOUNTER — Ambulatory Visit (HOSPITAL_BASED_OUTPATIENT_CLINIC_OR_DEPARTMENT_OTHER)
Admission: RE | Admit: 2018-10-19 | Discharge: 2018-10-19 | Disposition: A | Discharge status: Home / Self Care | Payer: Medicare Other | Source: Ambulatory Visit | Attending: Family Medicine | Admitting: Family Medicine

## 2018-10-19 DIAGNOSIS — R918 Other nonspecific abnormal finding of lung field: Secondary | ICD-10-CM | POA: Insufficient documentation

## 2018-10-19 DIAGNOSIS — R911 Solitary pulmonary nodule: Secondary | ICD-10-CM

## 2018-10-20 ENCOUNTER — Telehealth: Payer: Self-pay | Admitting: Family Medicine

## 2018-10-20 ENCOUNTER — Encounter: Payer: Self-pay | Admitting: Family Medicine

## 2018-10-20 DIAGNOSIS — R911 Solitary pulmonary nodule: Secondary | ICD-10-CM

## 2018-10-20 NOTE — Telephone Encounter (Signed)
Called with CT results.  Told me she did not get vesicare.

## 2018-10-21 ENCOUNTER — Other Ambulatory Visit: Payer: Self-pay | Admitting: Family Medicine

## 2018-10-21 DIAGNOSIS — I10 Essential (primary) hypertension: Secondary | ICD-10-CM

## 2018-10-27 ENCOUNTER — Other Ambulatory Visit: Payer: Self-pay | Admitting: Family Medicine

## 2018-10-27 DIAGNOSIS — M545 Low back pain: Secondary | ICD-10-CM

## 2018-10-27 DIAGNOSIS — R296 Repeated falls: Secondary | ICD-10-CM

## 2018-10-27 DIAGNOSIS — M79605 Pain in left leg: Secondary | ICD-10-CM

## 2018-10-30 ENCOUNTER — Other Ambulatory Visit: Payer: Self-pay

## 2018-10-30 MED ORDER — MELOXICAM 7.5 MG PO TABS: 7.5000 mg | ORAL_TABLET | Freq: Every day | ORAL | 3 refills | Status: DC

## 2018-11-23 ENCOUNTER — Telehealth: Payer: Self-pay

## 2018-11-23 NOTE — Telephone Encounter (Signed)
Pt informed.  Jessica Fleeger, CMA  

## 2018-11-23 NOTE — Telephone Encounter (Signed)
Form completed and left up front for patient to pick up.

## 2018-11-23 NOTE — Telephone Encounter (Signed)
Pt calling to ask Dr. Andria Frames if he would fill out a form for her to get a handicap plaque. Pt gets worn out from walking to and from stores. Ottis Stain, CMA]

## 2018-12-04 ENCOUNTER — Other Ambulatory Visit: Payer: Self-pay | Admitting: Family Medicine

## 2018-12-04 NOTE — Telephone Encounter (Signed)
I verified with the patient that the correct dose is amlodipine 5, not 10 mg.

## 2018-12-07 DIAGNOSIS — Z23 Encounter for immunization: Secondary | ICD-10-CM | POA: Diagnosis not present

## 2018-12-14 ENCOUNTER — Encounter: Payer: Self-pay | Admitting: Family Medicine

## 2018-12-14 ENCOUNTER — Ambulatory Visit (INDEPENDENT_AMBULATORY_CARE_PROVIDER_SITE_OTHER): Payer: Medicare Other | Admitting: Family Medicine

## 2018-12-14 ENCOUNTER — Other Ambulatory Visit: Payer: Self-pay

## 2018-12-14 DIAGNOSIS — R296 Repeated falls: Secondary | ICD-10-CM | POA: Diagnosis not present

## 2018-12-14 DIAGNOSIS — R32 Unspecified urinary incontinence: Secondary | ICD-10-CM

## 2018-12-14 DIAGNOSIS — Z0289 Encounter for other administrative examinations: Secondary | ICD-10-CM | POA: Diagnosis not present

## 2018-12-14 DIAGNOSIS — Z0000 Encounter for general adult medical examination without abnormal findings: Secondary | ICD-10-CM

## 2018-12-14 DIAGNOSIS — M47812 Spondylosis without myelopathy or radiculopathy, cervical region: Secondary | ICD-10-CM

## 2018-12-14 NOTE — Assessment & Plan Note (Signed)
A reason contributing to her need for assisted living.

## 2018-12-14 NOTE — Progress Notes (Signed)
Established Patient Office Visit  Subjective:  Patient ID: Gina Bishop, female    DOB: [birth year]  Age: 85 y.o. MRN: [id number]  CC:  Chief Complaint  Patient presents with  . FL2 Form    HPI [name] presents for FL2 form.  Patient had a fall last spring and has been having worse low back pain and frequent falls and stumbles since.  Her husband in in the memory care unit of a nursing home in [place].  She has been living alone in her home since he was admitted almost one year ago.  She finds it increasing difficult to live alone.  As such, she intends to transition to an assisted living apartment in the same complex with her husband.  She needs an FL2 form completed.    I did a thorough med and problem list review.  We completed the form together.    She had no acute complaints.  She felt she would benefit from some PT for her frequent falls.  Past Medical History:  Diagnosis Date  . Allergy   . Arthritis   . Blood transfusion   . Hyperlipidemia   . Hypertension   . Scoliosis     Past Surgical History:  Procedure Laterality Date  . ABDOMINAL HYSTERECTOMY    . APPENDECTOMY    . CESAREAN SECTION    . JOINT REPLACEMENT      Family History  Problem Relation Age of Onset  . Kidney disease Mother   . Cancer Mother        Kidney  . Heart disease Father   . Cancer Brother        Colon  . Diabetes Son        T1DM  . Rosacea Son       Outpatient Medications Prior to Visit  Medication Sig Dispense Refill  . amLODipine (NORVASC) 5 MG tablet Take 1 tablet (5 mg total) by mouth at bedtime. 90 tablet 3  . benazepril (LOTENSIN) 10 MG tablet Take 1 tablet (10 mg total) by mouth daily. 90 tablet 3  . fish oil-omega-3 fatty acids 1000 MG capsule Take 1 g by mouth daily.      Marland Kitchen gabapentin (NEURONTIN) 100 MG capsule Take 1 capsule (100 mg total) by mouth 3 (three) times daily. 90 capsule 3  . loratadine (CLARITIN) 10 MG tablet Take 1 tablet (10 mg total) by mouth  daily. 90 tablet 3  . meloxicam (MOBIC) 7.5 MG tablet Take 1 tablet (7.5 mg total) by mouth daily. 90 tablet 3  . metoprolol succinate (TOPROL-XL) 25 MG 24 hr tablet TAKE 1 TABLET BY MOUTH EVERY DAY 90 tablet 3  . pramipexole (MIRAPEX) 0.5 MG tablet TAKE 1 TABLET(0.5 MG) BY MOUTH AT BEDTIME 90 tablet 3  . pravastatin (PRAVACHOL) 40 MG tablet TAKE 1 TABLET(40 MG) BY MOUTH DAILY 90 tablet 3   No facility-administered medications prior to visit.     Allergies  Allergen Reactions  . Fish Allergy   . Orange Juice [Orange Oil]   . Strawberry Extract   . Vicodin [Hydrocodone-Acetaminophen] Other (See Comments)    hallucinatations and nightmares  . Erythromycin Diarrhea  . Amoxicillin Nausea And Vomiting  . Hydrochlorothiazide Other (See Comments)    Hyponatremia while on HCTZ    ROS Review of Systems    Objective:    Physical Exam  BP (!) 150/60   Pulse 62   Wt 161 lb 6 oz (73.2 kg)   SpO2 96%  BMI 26.85 kg/m  Wt Readings from Last 3 Encounters:  12/14/18 161 lb 6 oz (73.2 kg)  10/11/18 159 lb 6.4 oz (72.3 kg)  08/11/18 158 lb (71.7 kg)   Exam not done    There are no preventive care reminders to display for this patient.  There are no preventive care reminders to display for this patient.  Lab Results  Component Value Date   TSH 2.825 11/28/2014   Lab Results  Component Value Date   WBC 7.6 10/11/2018   HGB 12.0 10/11/2018   HCT 35.5 10/11/2018   MCV 86 10/11/2018   PLT 211 10/11/2018   Lab Results  Component Value Date   NA 139 10/11/2018   K 4.6 10/11/2018   CO2 22 10/11/2018   GLUCOSE 161 (H) 10/11/2018   BUN 31 (H) 10/11/2018   CREATININE 0.99 10/11/2018   BILITOT 0.3 10/11/2018   ALKPHOS 111 10/11/2018   AST 18 10/11/2018   ALT 12 10/11/2018   PROT 6.3 10/11/2018   ALBUMIN 4.7 (H) 10/11/2018   CALCIUM 9.5 10/11/2018   Lab Results  Component Value Date   CHOL 196 10/11/2018   Lab Results  Component Value Date   HDL 61 10/11/2018    Lab Results  Component Value Date   LDLCALC 102 (H) 10/11/2018   Lab Results  Component Value Date   TRIG 164 (H) 10/11/2018   Lab Results  Component Value Date   CHOLHDL 3.2 10/11/2018   Lab Results  Component Value Date   HGBA1C 5.4 10/11/2018      Assessment & Plan:   Problem List Items Addressed This Visit    None      No orders of the defined types were placed in this encounter.   Follow-up: No follow-ups on file.    Gina Resides, MD

## 2018-12-14 NOTE — Assessment & Plan Note (Signed)
FL2 form completed.  Copy will be scanned.  Original given to the patient.

## 2018-12-23 ENCOUNTER — Other Ambulatory Visit: Payer: Self-pay | Admitting: Family Medicine

## 2018-12-23 DIAGNOSIS — I10 Essential (primary) hypertension: Secondary | ICD-10-CM

## 2019-01-03 ENCOUNTER — Ambulatory Visit (INDEPENDENT_AMBULATORY_CARE_PROVIDER_SITE_OTHER): Payer: Medicare Other

## 2019-01-03 ENCOUNTER — Other Ambulatory Visit: Payer: Self-pay

## 2019-01-03 DIAGNOSIS — Z111 Encounter for screening for respiratory tuberculosis: Secondary | ICD-10-CM | POA: Diagnosis not present

## 2019-01-03 NOTE — Progress Notes (Signed)
Tuberculin skin test applied to left ventral forearm. Explained how to read the test, measuring induration not just erythema. Patient to return back to nurse clinic on Friday morning. Reminder card given.

## 2019-01-05 ENCOUNTER — Ambulatory Visit: Payer: Medicare Other

## 2019-01-05 ENCOUNTER — Other Ambulatory Visit: Payer: Self-pay

## 2019-01-05 DIAGNOSIS — Z20828 Contact with and (suspected) exposure to other viral communicable diseases: Secondary | ICD-10-CM | POA: Diagnosis not present

## 2019-01-05 DIAGNOSIS — Z111 Encounter for screening for respiratory tuberculosis: Secondary | ICD-10-CM

## 2019-01-05 LAB — TB SKIN TEST
Induration: 0 mm
TB Skin Test: NEGATIVE

## 2019-01-05 NOTE — Progress Notes (Signed)
PPD Reading Note PPD read and results entered in EpicCare. Result: 0 mm induration. Interpretation: Negative Allergic reaction: No  

## 2019-01-09 ENCOUNTER — Other Ambulatory Visit: Payer: Self-pay | Admitting: Family Medicine

## 2019-01-09 DIAGNOSIS — I10 Essential (primary) hypertension: Secondary | ICD-10-CM

## 2019-01-09 MED ORDER — PRAVASTATIN SODIUM 40 MG PO TABS: ORAL_TABLET | ORAL | 3 refills | Status: DC

## 2019-01-21 ENCOUNTER — Other Ambulatory Visit: Payer: Self-pay | Admitting: Family Medicine

## 2019-01-21 DIAGNOSIS — M545 Low back pain, unspecified: Secondary | ICD-10-CM

## 2019-01-25 DIAGNOSIS — H40243 Residual stage of angle-closure glaucoma, bilateral: Secondary | ICD-10-CM | POA: Diagnosis not present

## 2019-02-19 DIAGNOSIS — R296 Repeated falls: Secondary | ICD-10-CM | POA: Diagnosis not present

## 2019-02-19 DIAGNOSIS — M5136 Other intervertebral disc degeneration, lumbar region: Secondary | ICD-10-CM | POA: Diagnosis not present

## 2019-02-19 DIAGNOSIS — M6281 Muscle weakness (generalized): Secondary | ICD-10-CM | POA: Diagnosis not present

## 2019-02-19 DIAGNOSIS — R2681 Unsteadiness on feet: Secondary | ICD-10-CM | POA: Diagnosis not present

## 2019-02-20 DIAGNOSIS — R2681 Unsteadiness on feet: Secondary | ICD-10-CM | POA: Diagnosis not present

## 2019-02-20 DIAGNOSIS — M6281 Muscle weakness (generalized): Secondary | ICD-10-CM | POA: Diagnosis not present

## 2019-02-20 DIAGNOSIS — R296 Repeated falls: Secondary | ICD-10-CM | POA: Diagnosis not present

## 2019-02-20 DIAGNOSIS — M5136 Other intervertebral disc degeneration, lumbar region: Secondary | ICD-10-CM | POA: Diagnosis not present

## 2019-02-21 DIAGNOSIS — R2681 Unsteadiness on feet: Secondary | ICD-10-CM | POA: Diagnosis not present

## 2019-02-21 DIAGNOSIS — R296 Repeated falls: Secondary | ICD-10-CM | POA: Diagnosis not present

## 2019-02-21 DIAGNOSIS — M5136 Other intervertebral disc degeneration, lumbar region: Secondary | ICD-10-CM | POA: Diagnosis not present

## 2019-02-21 DIAGNOSIS — M6281 Muscle weakness (generalized): Secondary | ICD-10-CM | POA: Diagnosis not present

## 2019-02-22 DIAGNOSIS — M5136 Other intervertebral disc degeneration, lumbar region: Secondary | ICD-10-CM | POA: Diagnosis not present

## 2019-02-22 DIAGNOSIS — R2681 Unsteadiness on feet: Secondary | ICD-10-CM | POA: Diagnosis not present

## 2019-02-22 DIAGNOSIS — R296 Repeated falls: Secondary | ICD-10-CM | POA: Diagnosis not present

## 2019-02-22 DIAGNOSIS — M6281 Muscle weakness (generalized): Secondary | ICD-10-CM | POA: Diagnosis not present

## 2019-02-26 DIAGNOSIS — M6281 Muscle weakness (generalized): Secondary | ICD-10-CM | POA: Diagnosis not present

## 2019-02-26 DIAGNOSIS — M5136 Other intervertebral disc degeneration, lumbar region: Secondary | ICD-10-CM | POA: Diagnosis not present

## 2019-02-26 DIAGNOSIS — R296 Repeated falls: Secondary | ICD-10-CM | POA: Diagnosis not present

## 2019-02-26 DIAGNOSIS — R2681 Unsteadiness on feet: Secondary | ICD-10-CM | POA: Diagnosis not present

## 2019-02-27 ENCOUNTER — Other Ambulatory Visit: Payer: Self-pay | Admitting: Family Medicine

## 2019-02-28 DIAGNOSIS — R296 Repeated falls: Secondary | ICD-10-CM | POA: Diagnosis not present

## 2019-02-28 DIAGNOSIS — M6281 Muscle weakness (generalized): Secondary | ICD-10-CM | POA: Diagnosis not present

## 2019-02-28 DIAGNOSIS — R2681 Unsteadiness on feet: Secondary | ICD-10-CM | POA: Diagnosis not present

## 2019-02-28 DIAGNOSIS — M5136 Other intervertebral disc degeneration, lumbar region: Secondary | ICD-10-CM | POA: Diagnosis not present

## 2019-03-01 DIAGNOSIS — R296 Repeated falls: Secondary | ICD-10-CM | POA: Diagnosis not present

## 2019-03-01 DIAGNOSIS — R2681 Unsteadiness on feet: Secondary | ICD-10-CM | POA: Diagnosis not present

## 2019-03-01 DIAGNOSIS — M6281 Muscle weakness (generalized): Secondary | ICD-10-CM | POA: Diagnosis not present

## 2019-03-01 DIAGNOSIS — M5136 Other intervertebral disc degeneration, lumbar region: Secondary | ICD-10-CM | POA: Diagnosis not present

## 2019-03-02 DIAGNOSIS — R2681 Unsteadiness on feet: Secondary | ICD-10-CM | POA: Diagnosis not present

## 2019-03-02 DIAGNOSIS — R296 Repeated falls: Secondary | ICD-10-CM | POA: Diagnosis not present

## 2019-03-02 DIAGNOSIS — M5136 Other intervertebral disc degeneration, lumbar region: Secondary | ICD-10-CM | POA: Diagnosis not present

## 2019-03-02 DIAGNOSIS — M6281 Muscle weakness (generalized): Secondary | ICD-10-CM | POA: Diagnosis not present

## 2019-03-07 DIAGNOSIS — M6281 Muscle weakness (generalized): Secondary | ICD-10-CM | POA: Diagnosis not present

## 2019-03-07 DIAGNOSIS — M5136 Other intervertebral disc degeneration, lumbar region: Secondary | ICD-10-CM | POA: Diagnosis not present

## 2019-03-07 DIAGNOSIS — R296 Repeated falls: Secondary | ICD-10-CM | POA: Diagnosis not present

## 2019-03-07 DIAGNOSIS — R2681 Unsteadiness on feet: Secondary | ICD-10-CM | POA: Diagnosis not present

## 2019-03-08 DIAGNOSIS — M6281 Muscle weakness (generalized): Secondary | ICD-10-CM | POA: Diagnosis not present

## 2019-03-08 DIAGNOSIS — R296 Repeated falls: Secondary | ICD-10-CM | POA: Diagnosis not present

## 2019-03-08 DIAGNOSIS — R2681 Unsteadiness on feet: Secondary | ICD-10-CM | POA: Diagnosis not present

## 2019-03-08 DIAGNOSIS — M5136 Other intervertebral disc degeneration, lumbar region: Secondary | ICD-10-CM | POA: Diagnosis not present

## 2019-03-12 DIAGNOSIS — R2681 Unsteadiness on feet: Secondary | ICD-10-CM | POA: Diagnosis not present

## 2019-03-12 DIAGNOSIS — M6281 Muscle weakness (generalized): Secondary | ICD-10-CM | POA: Diagnosis not present

## 2019-03-12 DIAGNOSIS — R296 Repeated falls: Secondary | ICD-10-CM | POA: Diagnosis not present

## 2019-03-12 DIAGNOSIS — M5136 Other intervertebral disc degeneration, lumbar region: Secondary | ICD-10-CM | POA: Diagnosis not present

## 2019-03-14 DIAGNOSIS — M6281 Muscle weakness (generalized): Secondary | ICD-10-CM | POA: Diagnosis not present

## 2019-03-14 DIAGNOSIS — R296 Repeated falls: Secondary | ICD-10-CM | POA: Diagnosis not present

## 2019-03-14 DIAGNOSIS — M5136 Other intervertebral disc degeneration, lumbar region: Secondary | ICD-10-CM | POA: Diagnosis not present

## 2019-03-14 DIAGNOSIS — R2681 Unsteadiness on feet: Secondary | ICD-10-CM | POA: Diagnosis not present

## 2019-03-15 DIAGNOSIS — M5136 Other intervertebral disc degeneration, lumbar region: Secondary | ICD-10-CM | POA: Diagnosis not present

## 2019-03-15 DIAGNOSIS — R296 Repeated falls: Secondary | ICD-10-CM | POA: Diagnosis not present

## 2019-03-15 DIAGNOSIS — R2681 Unsteadiness on feet: Secondary | ICD-10-CM | POA: Diagnosis not present

## 2019-03-15 DIAGNOSIS — M6281 Muscle weakness (generalized): Secondary | ICD-10-CM | POA: Diagnosis not present

## 2019-03-16 DIAGNOSIS — M6281 Muscle weakness (generalized): Secondary | ICD-10-CM | POA: Diagnosis not present

## 2019-03-16 DIAGNOSIS — M5136 Other intervertebral disc degeneration, lumbar region: Secondary | ICD-10-CM | POA: Diagnosis not present

## 2019-03-16 DIAGNOSIS — R2681 Unsteadiness on feet: Secondary | ICD-10-CM | POA: Diagnosis not present

## 2019-03-16 DIAGNOSIS — R296 Repeated falls: Secondary | ICD-10-CM | POA: Diagnosis not present

## 2019-03-19 DIAGNOSIS — M5136 Other intervertebral disc degeneration, lumbar region: Secondary | ICD-10-CM | POA: Diagnosis not present

## 2019-03-19 DIAGNOSIS — R2681 Unsteadiness on feet: Secondary | ICD-10-CM | POA: Diagnosis not present

## 2019-03-19 DIAGNOSIS — R296 Repeated falls: Secondary | ICD-10-CM | POA: Diagnosis not present

## 2019-03-19 DIAGNOSIS — M6281 Muscle weakness (generalized): Secondary | ICD-10-CM | POA: Diagnosis not present

## 2019-03-20 DIAGNOSIS — R2681 Unsteadiness on feet: Secondary | ICD-10-CM | POA: Diagnosis not present

## 2019-03-20 DIAGNOSIS — M5136 Other intervertebral disc degeneration, lumbar region: Secondary | ICD-10-CM | POA: Diagnosis not present

## 2019-03-20 DIAGNOSIS — M6281 Muscle weakness (generalized): Secondary | ICD-10-CM | POA: Diagnosis not present

## 2019-03-20 DIAGNOSIS — R296 Repeated falls: Secondary | ICD-10-CM | POA: Diagnosis not present

## 2019-03-21 DIAGNOSIS — M6281 Muscle weakness (generalized): Secondary | ICD-10-CM | POA: Diagnosis not present

## 2019-03-21 DIAGNOSIS — R296 Repeated falls: Secondary | ICD-10-CM | POA: Diagnosis not present

## 2019-03-21 DIAGNOSIS — M5136 Other intervertebral disc degeneration, lumbar region: Secondary | ICD-10-CM | POA: Diagnosis not present

## 2019-03-21 DIAGNOSIS — R2681 Unsteadiness on feet: Secondary | ICD-10-CM | POA: Diagnosis not present

## 2019-03-22 DIAGNOSIS — M6281 Muscle weakness (generalized): Secondary | ICD-10-CM | POA: Diagnosis not present

## 2019-03-22 DIAGNOSIS — R296 Repeated falls: Secondary | ICD-10-CM | POA: Diagnosis not present

## 2019-03-22 DIAGNOSIS — R2681 Unsteadiness on feet: Secondary | ICD-10-CM | POA: Diagnosis not present

## 2019-03-22 DIAGNOSIS — M5136 Other intervertebral disc degeneration, lumbar region: Secondary | ICD-10-CM | POA: Diagnosis not present

## 2019-03-23 DIAGNOSIS — R2681 Unsteadiness on feet: Secondary | ICD-10-CM | POA: Diagnosis not present

## 2019-03-23 DIAGNOSIS — M5136 Other intervertebral disc degeneration, lumbar region: Secondary | ICD-10-CM | POA: Diagnosis not present

## 2019-03-23 DIAGNOSIS — M6281 Muscle weakness (generalized): Secondary | ICD-10-CM | POA: Diagnosis not present

## 2019-03-23 DIAGNOSIS — R296 Repeated falls: Secondary | ICD-10-CM | POA: Diagnosis not present

## 2019-03-26 DIAGNOSIS — R296 Repeated falls: Secondary | ICD-10-CM | POA: Diagnosis not present

## 2019-03-26 DIAGNOSIS — M5136 Other intervertebral disc degeneration, lumbar region: Secondary | ICD-10-CM | POA: Diagnosis not present

## 2019-03-26 DIAGNOSIS — M6281 Muscle weakness (generalized): Secondary | ICD-10-CM | POA: Diagnosis not present

## 2019-03-26 DIAGNOSIS — R2681 Unsteadiness on feet: Secondary | ICD-10-CM | POA: Diagnosis not present

## 2019-03-28 DIAGNOSIS — R296 Repeated falls: Secondary | ICD-10-CM | POA: Diagnosis not present

## 2019-03-28 DIAGNOSIS — R2681 Unsteadiness on feet: Secondary | ICD-10-CM | POA: Diagnosis not present

## 2019-03-28 DIAGNOSIS — M6281 Muscle weakness (generalized): Secondary | ICD-10-CM | POA: Diagnosis not present

## 2019-03-28 DIAGNOSIS — M5136 Other intervertebral disc degeneration, lumbar region: Secondary | ICD-10-CM | POA: Diagnosis not present

## 2019-03-29 DIAGNOSIS — M5136 Other intervertebral disc degeneration, lumbar region: Secondary | ICD-10-CM | POA: Diagnosis not present

## 2019-03-29 DIAGNOSIS — R296 Repeated falls: Secondary | ICD-10-CM | POA: Diagnosis not present

## 2019-03-29 DIAGNOSIS — R2681 Unsteadiness on feet: Secondary | ICD-10-CM | POA: Diagnosis not present

## 2019-03-29 DIAGNOSIS — M6281 Muscle weakness (generalized): Secondary | ICD-10-CM | POA: Diagnosis not present

## 2019-03-30 DIAGNOSIS — R2681 Unsteadiness on feet: Secondary | ICD-10-CM | POA: Diagnosis not present

## 2019-03-30 DIAGNOSIS — R296 Repeated falls: Secondary | ICD-10-CM | POA: Diagnosis not present

## 2019-03-30 DIAGNOSIS — M6281 Muscle weakness (generalized): Secondary | ICD-10-CM | POA: Diagnosis not present

## 2019-03-30 DIAGNOSIS — M5136 Other intervertebral disc degeneration, lumbar region: Secondary | ICD-10-CM | POA: Diagnosis not present

## 2019-04-02 DIAGNOSIS — R296 Repeated falls: Secondary | ICD-10-CM | POA: Diagnosis not present

## 2019-04-02 DIAGNOSIS — R2681 Unsteadiness on feet: Secondary | ICD-10-CM | POA: Diagnosis not present

## 2019-04-02 DIAGNOSIS — M6281 Muscle weakness (generalized): Secondary | ICD-10-CM | POA: Diagnosis not present

## 2019-04-02 DIAGNOSIS — M5136 Other intervertebral disc degeneration, lumbar region: Secondary | ICD-10-CM | POA: Diagnosis not present

## 2019-04-03 DIAGNOSIS — M5136 Other intervertebral disc degeneration, lumbar region: Secondary | ICD-10-CM | POA: Diagnosis not present

## 2019-04-03 DIAGNOSIS — R2681 Unsteadiness on feet: Secondary | ICD-10-CM | POA: Diagnosis not present

## 2019-04-03 DIAGNOSIS — M6281 Muscle weakness (generalized): Secondary | ICD-10-CM | POA: Diagnosis not present

## 2019-04-03 DIAGNOSIS — R296 Repeated falls: Secondary | ICD-10-CM | POA: Diagnosis not present

## 2019-04-05 DIAGNOSIS — Z23 Encounter for immunization: Secondary | ICD-10-CM | POA: Diagnosis not present

## 2019-04-06 DIAGNOSIS — M5136 Other intervertebral disc degeneration, lumbar region: Secondary | ICD-10-CM | POA: Diagnosis not present

## 2019-04-06 DIAGNOSIS — M6281 Muscle weakness (generalized): Secondary | ICD-10-CM | POA: Diagnosis not present

## 2019-04-06 DIAGNOSIS — R296 Repeated falls: Secondary | ICD-10-CM | POA: Diagnosis not present

## 2019-04-06 DIAGNOSIS — R2681 Unsteadiness on feet: Secondary | ICD-10-CM | POA: Diagnosis not present

## 2019-04-09 DIAGNOSIS — R296 Repeated falls: Secondary | ICD-10-CM | POA: Diagnosis not present

## 2019-04-09 DIAGNOSIS — R2681 Unsteadiness on feet: Secondary | ICD-10-CM | POA: Diagnosis not present

## 2019-04-09 DIAGNOSIS — M6281 Muscle weakness (generalized): Secondary | ICD-10-CM | POA: Diagnosis not present

## 2019-04-09 DIAGNOSIS — M5136 Other intervertebral disc degeneration, lumbar region: Secondary | ICD-10-CM | POA: Diagnosis not present

## 2019-04-10 DIAGNOSIS — R296 Repeated falls: Secondary | ICD-10-CM | POA: Diagnosis not present

## 2019-04-10 DIAGNOSIS — M6281 Muscle weakness (generalized): Secondary | ICD-10-CM | POA: Diagnosis not present

## 2019-04-10 DIAGNOSIS — R2681 Unsteadiness on feet: Secondary | ICD-10-CM | POA: Diagnosis not present

## 2019-04-10 DIAGNOSIS — M5136 Other intervertebral disc degeneration, lumbar region: Secondary | ICD-10-CM | POA: Diagnosis not present

## 2019-04-11 DIAGNOSIS — M6281 Muscle weakness (generalized): Secondary | ICD-10-CM | POA: Diagnosis not present

## 2019-04-11 DIAGNOSIS — R2681 Unsteadiness on feet: Secondary | ICD-10-CM | POA: Diagnosis not present

## 2019-04-11 DIAGNOSIS — R296 Repeated falls: Secondary | ICD-10-CM | POA: Diagnosis not present

## 2019-04-11 DIAGNOSIS — M5136 Other intervertebral disc degeneration, lumbar region: Secondary | ICD-10-CM | POA: Diagnosis not present

## 2019-04-12 DIAGNOSIS — R2681 Unsteadiness on feet: Secondary | ICD-10-CM | POA: Diagnosis not present

## 2019-04-12 DIAGNOSIS — M6281 Muscle weakness (generalized): Secondary | ICD-10-CM | POA: Diagnosis not present

## 2019-04-12 DIAGNOSIS — M5136 Other intervertebral disc degeneration, lumbar region: Secondary | ICD-10-CM | POA: Diagnosis not present

## 2019-04-12 DIAGNOSIS — R296 Repeated falls: Secondary | ICD-10-CM | POA: Diagnosis not present

## 2019-04-13 DIAGNOSIS — R2681 Unsteadiness on feet: Secondary | ICD-10-CM | POA: Diagnosis not present

## 2019-04-13 DIAGNOSIS — R296 Repeated falls: Secondary | ICD-10-CM | POA: Diagnosis not present

## 2019-04-13 DIAGNOSIS — M6281 Muscle weakness (generalized): Secondary | ICD-10-CM | POA: Diagnosis not present

## 2019-04-13 DIAGNOSIS — M5136 Other intervertebral disc degeneration, lumbar region: Secondary | ICD-10-CM | POA: Diagnosis not present

## 2019-04-16 DIAGNOSIS — M6281 Muscle weakness (generalized): Secondary | ICD-10-CM | POA: Diagnosis not present

## 2019-04-16 DIAGNOSIS — M5136 Other intervertebral disc degeneration, lumbar region: Secondary | ICD-10-CM | POA: Diagnosis not present

## 2019-04-16 DIAGNOSIS — R296 Repeated falls: Secondary | ICD-10-CM | POA: Diagnosis not present

## 2019-04-16 DIAGNOSIS — R2681 Unsteadiness on feet: Secondary | ICD-10-CM | POA: Diagnosis not present

## 2019-04-17 DIAGNOSIS — R296 Repeated falls: Secondary | ICD-10-CM | POA: Diagnosis not present

## 2019-04-17 DIAGNOSIS — R2681 Unsteadiness on feet: Secondary | ICD-10-CM | POA: Diagnosis not present

## 2019-04-17 DIAGNOSIS — M6281 Muscle weakness (generalized): Secondary | ICD-10-CM | POA: Diagnosis not present

## 2019-04-17 DIAGNOSIS — M5136 Other intervertebral disc degeneration, lumbar region: Secondary | ICD-10-CM | POA: Diagnosis not present

## 2019-04-18 DIAGNOSIS — M6281 Muscle weakness (generalized): Secondary | ICD-10-CM | POA: Diagnosis not present

## 2019-04-18 DIAGNOSIS — R2681 Unsteadiness on feet: Secondary | ICD-10-CM | POA: Diagnosis not present

## 2019-04-18 DIAGNOSIS — R296 Repeated falls: Secondary | ICD-10-CM | POA: Diagnosis not present

## 2019-04-18 DIAGNOSIS — M5136 Other intervertebral disc degeneration, lumbar region: Secondary | ICD-10-CM | POA: Diagnosis not present

## 2019-04-19 DIAGNOSIS — R2681 Unsteadiness on feet: Secondary | ICD-10-CM | POA: Diagnosis not present

## 2019-04-19 DIAGNOSIS — M6281 Muscle weakness (generalized): Secondary | ICD-10-CM | POA: Diagnosis not present

## 2019-04-19 DIAGNOSIS — M5136 Other intervertebral disc degeneration, lumbar region: Secondary | ICD-10-CM | POA: Diagnosis not present

## 2019-04-19 DIAGNOSIS — R296 Repeated falls: Secondary | ICD-10-CM | POA: Diagnosis not present

## 2019-04-23 DIAGNOSIS — R2681 Unsteadiness on feet: Secondary | ICD-10-CM | POA: Diagnosis not present

## 2019-04-23 DIAGNOSIS — R296 Repeated falls: Secondary | ICD-10-CM | POA: Diagnosis not present

## 2019-04-23 DIAGNOSIS — M5136 Other intervertebral disc degeneration, lumbar region: Secondary | ICD-10-CM | POA: Diagnosis not present

## 2019-04-23 DIAGNOSIS — M6281 Muscle weakness (generalized): Secondary | ICD-10-CM | POA: Diagnosis not present

## 2019-04-24 ENCOUNTER — Telehealth: Payer: Self-pay

## 2019-04-24 NOTE — Telephone Encounter (Signed)
Pt calls nurse line to inform provider that she has quit taking amlodipine. Pt reports that she stopped taking approx. 1 month ago. Patient states that she was having considerable swelling in her feet while on medication. Since D/c, she reports improvement. Pt denies any elevated Bps or symptoms at this time.    Patient also expressed financial concerns regarding living in assisted living. Could patient receive referral for care management?   To PCP for next steps  Talbot Grumbling

## 2019-04-24 NOTE — Telephone Encounter (Signed)
Called patient.  DCed the amlodipine.  Discussed financial challenges, with a specific focus on how I can help.  She has a long term care policy feels she now needs that long term care.  She will contact her agent and then let me know what documentation she needs from me to support her need for activation of the long term care policy. No other actions needed at this point.

## 2019-04-25 DIAGNOSIS — R2681 Unsteadiness on feet: Secondary | ICD-10-CM | POA: Diagnosis not present

## 2019-04-25 DIAGNOSIS — M5136 Other intervertebral disc degeneration, lumbar region: Secondary | ICD-10-CM | POA: Diagnosis not present

## 2019-04-25 DIAGNOSIS — M6281 Muscle weakness (generalized): Secondary | ICD-10-CM | POA: Diagnosis not present

## 2019-04-25 DIAGNOSIS — R296 Repeated falls: Secondary | ICD-10-CM | POA: Diagnosis not present

## 2019-04-26 DIAGNOSIS — R296 Repeated falls: Secondary | ICD-10-CM | POA: Diagnosis not present

## 2019-04-26 DIAGNOSIS — R2681 Unsteadiness on feet: Secondary | ICD-10-CM | POA: Diagnosis not present

## 2019-04-26 DIAGNOSIS — M6281 Muscle weakness (generalized): Secondary | ICD-10-CM | POA: Diagnosis not present

## 2019-04-26 DIAGNOSIS — M5136 Other intervertebral disc degeneration, lumbar region: Secondary | ICD-10-CM | POA: Diagnosis not present

## 2019-04-27 DIAGNOSIS — R296 Repeated falls: Secondary | ICD-10-CM | POA: Diagnosis not present

## 2019-04-27 DIAGNOSIS — R2681 Unsteadiness on feet: Secondary | ICD-10-CM | POA: Diagnosis not present

## 2019-04-27 DIAGNOSIS — M6281 Muscle weakness (generalized): Secondary | ICD-10-CM | POA: Diagnosis not present

## 2019-04-27 DIAGNOSIS — M5136 Other intervertebral disc degeneration, lumbar region: Secondary | ICD-10-CM | POA: Diagnosis not present

## 2019-04-30 DIAGNOSIS — M6281 Muscle weakness (generalized): Secondary | ICD-10-CM | POA: Diagnosis not present

## 2019-04-30 DIAGNOSIS — R296 Repeated falls: Secondary | ICD-10-CM | POA: Diagnosis not present

## 2019-04-30 DIAGNOSIS — M5136 Other intervertebral disc degeneration, lumbar region: Secondary | ICD-10-CM | POA: Diagnosis not present

## 2019-04-30 DIAGNOSIS — R2681 Unsteadiness on feet: Secondary | ICD-10-CM | POA: Diagnosis not present

## 2019-05-01 DIAGNOSIS — R296 Repeated falls: Secondary | ICD-10-CM | POA: Diagnosis not present

## 2019-05-01 DIAGNOSIS — M5136 Other intervertebral disc degeneration, lumbar region: Secondary | ICD-10-CM | POA: Diagnosis not present

## 2019-05-01 DIAGNOSIS — R2681 Unsteadiness on feet: Secondary | ICD-10-CM | POA: Diagnosis not present

## 2019-05-01 DIAGNOSIS — M6281 Muscle weakness (generalized): Secondary | ICD-10-CM | POA: Diagnosis not present

## 2019-05-02 DIAGNOSIS — R2681 Unsteadiness on feet: Secondary | ICD-10-CM | POA: Diagnosis not present

## 2019-05-02 DIAGNOSIS — M5136 Other intervertebral disc degeneration, lumbar region: Secondary | ICD-10-CM | POA: Diagnosis not present

## 2019-05-02 DIAGNOSIS — M6281 Muscle weakness (generalized): Secondary | ICD-10-CM | POA: Diagnosis not present

## 2019-05-02 DIAGNOSIS — R296 Repeated falls: Secondary | ICD-10-CM | POA: Diagnosis not present

## 2019-05-03 DIAGNOSIS — R296 Repeated falls: Secondary | ICD-10-CM | POA: Diagnosis not present

## 2019-05-03 DIAGNOSIS — Z23 Encounter for immunization: Secondary | ICD-10-CM | POA: Diagnosis not present

## 2019-05-03 DIAGNOSIS — M5136 Other intervertebral disc degeneration, lumbar region: Secondary | ICD-10-CM | POA: Diagnosis not present

## 2019-05-03 DIAGNOSIS — R2681 Unsteadiness on feet: Secondary | ICD-10-CM | POA: Diagnosis not present

## 2019-05-03 DIAGNOSIS — M6281 Muscle weakness (generalized): Secondary | ICD-10-CM | POA: Diagnosis not present

## 2019-05-04 DIAGNOSIS — M6281 Muscle weakness (generalized): Secondary | ICD-10-CM | POA: Diagnosis not present

## 2019-05-04 DIAGNOSIS — R2681 Unsteadiness on feet: Secondary | ICD-10-CM | POA: Diagnosis not present

## 2019-05-04 DIAGNOSIS — M5136 Other intervertebral disc degeneration, lumbar region: Secondary | ICD-10-CM | POA: Diagnosis not present

## 2019-05-04 DIAGNOSIS — R296 Repeated falls: Secondary | ICD-10-CM | POA: Diagnosis not present

## 2019-05-07 DIAGNOSIS — M6281 Muscle weakness (generalized): Secondary | ICD-10-CM | POA: Diagnosis not present

## 2019-05-07 DIAGNOSIS — R2681 Unsteadiness on feet: Secondary | ICD-10-CM | POA: Diagnosis not present

## 2019-05-07 DIAGNOSIS — M5136 Other intervertebral disc degeneration, lumbar region: Secondary | ICD-10-CM | POA: Diagnosis not present

## 2019-05-07 DIAGNOSIS — R296 Repeated falls: Secondary | ICD-10-CM | POA: Diagnosis not present

## 2019-05-08 DIAGNOSIS — M6281 Muscle weakness (generalized): Secondary | ICD-10-CM | POA: Diagnosis not present

## 2019-05-08 DIAGNOSIS — R2681 Unsteadiness on feet: Secondary | ICD-10-CM | POA: Diagnosis not present

## 2019-05-08 DIAGNOSIS — M5136 Other intervertebral disc degeneration, lumbar region: Secondary | ICD-10-CM | POA: Diagnosis not present

## 2019-05-08 DIAGNOSIS — R296 Repeated falls: Secondary | ICD-10-CM | POA: Diagnosis not present

## 2019-05-09 DIAGNOSIS — R296 Repeated falls: Secondary | ICD-10-CM | POA: Diagnosis not present

## 2019-05-09 DIAGNOSIS — M5136 Other intervertebral disc degeneration, lumbar region: Secondary | ICD-10-CM | POA: Diagnosis not present

## 2019-05-09 DIAGNOSIS — R2681 Unsteadiness on feet: Secondary | ICD-10-CM | POA: Diagnosis not present

## 2019-05-09 DIAGNOSIS — M6281 Muscle weakness (generalized): Secondary | ICD-10-CM | POA: Diagnosis not present

## 2019-05-10 DIAGNOSIS — R296 Repeated falls: Secondary | ICD-10-CM | POA: Diagnosis not present

## 2019-05-10 DIAGNOSIS — R2681 Unsteadiness on feet: Secondary | ICD-10-CM | POA: Diagnosis not present

## 2019-05-10 DIAGNOSIS — M5136 Other intervertebral disc degeneration, lumbar region: Secondary | ICD-10-CM | POA: Diagnosis not present

## 2019-05-10 DIAGNOSIS — M6281 Muscle weakness (generalized): Secondary | ICD-10-CM | POA: Diagnosis not present

## 2019-05-11 DIAGNOSIS — R2681 Unsteadiness on feet: Secondary | ICD-10-CM | POA: Diagnosis not present

## 2019-05-11 DIAGNOSIS — M6281 Muscle weakness (generalized): Secondary | ICD-10-CM | POA: Diagnosis not present

## 2019-05-11 DIAGNOSIS — M5136 Other intervertebral disc degeneration, lumbar region: Secondary | ICD-10-CM | POA: Diagnosis not present

## 2019-05-11 DIAGNOSIS — R296 Repeated falls: Secondary | ICD-10-CM | POA: Diagnosis not present

## 2019-05-14 DIAGNOSIS — M5136 Other intervertebral disc degeneration, lumbar region: Secondary | ICD-10-CM | POA: Diagnosis not present

## 2019-05-14 DIAGNOSIS — M6281 Muscle weakness (generalized): Secondary | ICD-10-CM | POA: Diagnosis not present

## 2019-05-14 DIAGNOSIS — R296 Repeated falls: Secondary | ICD-10-CM | POA: Diagnosis not present

## 2019-05-14 DIAGNOSIS — R2681 Unsteadiness on feet: Secondary | ICD-10-CM | POA: Diagnosis not present

## 2019-05-15 DIAGNOSIS — M5136 Other intervertebral disc degeneration, lumbar region: Secondary | ICD-10-CM | POA: Diagnosis not present

## 2019-05-15 DIAGNOSIS — R296 Repeated falls: Secondary | ICD-10-CM | POA: Diagnosis not present

## 2019-05-15 DIAGNOSIS — M6281 Muscle weakness (generalized): Secondary | ICD-10-CM | POA: Diagnosis not present

## 2019-05-15 DIAGNOSIS — R2681 Unsteadiness on feet: Secondary | ICD-10-CM | POA: Diagnosis not present

## 2019-05-16 ENCOUNTER — Telehealth (INDEPENDENT_AMBULATORY_CARE_PROVIDER_SITE_OTHER): Payer: Medicare Other | Admitting: Family Medicine

## 2019-05-16 ENCOUNTER — Other Ambulatory Visit: Payer: Self-pay

## 2019-05-16 DIAGNOSIS — M5136 Other intervertebral disc degeneration, lumbar region: Secondary | ICD-10-CM | POA: Diagnosis not present

## 2019-05-16 DIAGNOSIS — J301 Allergic rhinitis due to pollen: Secondary | ICD-10-CM | POA: Diagnosis not present

## 2019-05-16 DIAGNOSIS — R296 Repeated falls: Secondary | ICD-10-CM | POA: Diagnosis not present

## 2019-05-16 DIAGNOSIS — M6281 Muscle weakness (generalized): Secondary | ICD-10-CM | POA: Diagnosis not present

## 2019-05-16 DIAGNOSIS — R2681 Unsteadiness on feet: Secondary | ICD-10-CM | POA: Diagnosis not present

## 2019-05-16 NOTE — Assessment & Plan Note (Signed)
I think her sore throat is due to allergies.  No signs of infection.  Trial of clariten and flonase that she already has as well as tylenol and salt gargles.  If not better in 3-4 days or if worse or other symptoms to call use

## 2019-05-16 NOTE — Progress Notes (Signed)
[facility] Telemedicine Visit  Patient consented to have virtual visit. Method of visit: Telephone  Encounter participants: Patient: Gina Bishop - located at home Provider: Lind Covert - located at [facility]ice Others (if applicable): no  Chief Complaint: Sore Throat  HPI:  For last 5 days.  Associated with sneezing and nasal congestion.  No fever or swollen glands or sick contacts or shortness of breath or rash  Started clariten yesterday. Has not been using flonase or tylenol but has these at home   ROS: per HPI  Pertinent PMHx: seaonal allegies, falls  Exam:  Respiratory: normal voice and respiratory pattern  Assessment/Plan:  Seasonal allergic rhinitis due to pollen I think her sore throat is due to allergies.  No signs of infection.  Trial of clariten and flonase that she already has as well as tylenol and salt gargles.  If not better in 3-4 days or if worse or other symptoms to call use     Time spent during visit with patient: 11 minutes

## 2019-05-17 DIAGNOSIS — R296 Repeated falls: Secondary | ICD-10-CM | POA: Diagnosis not present

## 2019-05-17 DIAGNOSIS — M6281 Muscle weakness (generalized): Secondary | ICD-10-CM | POA: Diagnosis not present

## 2019-05-17 DIAGNOSIS — M5136 Other intervertebral disc degeneration, lumbar region: Secondary | ICD-10-CM | POA: Diagnosis not present

## 2019-05-17 DIAGNOSIS — R2681 Unsteadiness on feet: Secondary | ICD-10-CM | POA: Diagnosis not present

## 2019-05-17 DIAGNOSIS — R41841 Cognitive communication deficit: Secondary | ICD-10-CM | POA: Diagnosis not present

## 2019-05-22 DIAGNOSIS — R41841 Cognitive communication deficit: Secondary | ICD-10-CM | POA: Diagnosis not present

## 2019-05-22 DIAGNOSIS — R2681 Unsteadiness on feet: Secondary | ICD-10-CM | POA: Diagnosis not present

## 2019-05-22 DIAGNOSIS — R296 Repeated falls: Secondary | ICD-10-CM | POA: Diagnosis not present

## 2019-05-22 DIAGNOSIS — M5136 Other intervertebral disc degeneration, lumbar region: Secondary | ICD-10-CM | POA: Diagnosis not present

## 2019-05-22 DIAGNOSIS — M6281 Muscle weakness (generalized): Secondary | ICD-10-CM | POA: Diagnosis not present

## 2019-05-23 ENCOUNTER — Other Ambulatory Visit: Payer: Self-pay

## 2019-05-23 ENCOUNTER — Encounter: Payer: Self-pay | Admitting: Family Medicine

## 2019-05-23 ENCOUNTER — Ambulatory Visit (INDEPENDENT_AMBULATORY_CARE_PROVIDER_SITE_OTHER): Payer: Medicare Other | Admitting: Family Medicine

## 2019-05-23 ENCOUNTER — Ambulatory Visit (HOSPITAL_COMMUNITY)
Admission: RE | Admit: 2019-05-23 | Discharge: 2019-05-23 | Disposition: A | Discharge status: Home / Self Care | Payer: Medicare Other | Source: Ambulatory Visit | Attending: Family Medicine | Admitting: Family Medicine

## 2019-05-23 VITALS — BP 198/74 | HR 79 | Wt 157.0 lb

## 2019-05-23 DIAGNOSIS — R296 Repeated falls: Secondary | ICD-10-CM | POA: Diagnosis not present

## 2019-05-23 DIAGNOSIS — I10 Essential (primary) hypertension: Secondary | ICD-10-CM

## 2019-05-23 DIAGNOSIS — M79605 Pain in left leg: Secondary | ICD-10-CM | POA: Diagnosis not present

## 2019-05-23 DIAGNOSIS — R413 Other amnesia: Secondary | ICD-10-CM

## 2019-05-23 DIAGNOSIS — I499 Cardiac arrhythmia, unspecified: Secondary | ICD-10-CM

## 2019-05-23 DIAGNOSIS — R41841 Cognitive communication deficit: Secondary | ICD-10-CM | POA: Diagnosis not present

## 2019-05-23 DIAGNOSIS — R2681 Unsteadiness on feet: Secondary | ICD-10-CM | POA: Diagnosis not present

## 2019-05-23 DIAGNOSIS — M545 Low back pain, unspecified: Secondary | ICD-10-CM

## 2019-05-23 DIAGNOSIS — J301 Allergic rhinitis due to pollen: Secondary | ICD-10-CM

## 2019-05-23 DIAGNOSIS — R002 Palpitations: Secondary | ICD-10-CM | POA: Insufficient documentation

## 2019-05-23 DIAGNOSIS — M5136 Other intervertebral disc degeneration, lumbar region: Secondary | ICD-10-CM | POA: Diagnosis not present

## 2019-05-23 DIAGNOSIS — R32 Unspecified urinary incontinence: Secondary | ICD-10-CM | POA: Diagnosis not present

## 2019-05-23 DIAGNOSIS — M6281 Muscle weakness (generalized): Secondary | ICD-10-CM | POA: Diagnosis not present

## 2019-05-23 MED ORDER — GABAPENTIN 100 MG PO CAPS: 100.0000 mg | ORAL_CAPSULE | Freq: Every day | ORAL | 12 refills | Status: DC

## 2019-05-23 MED ORDER — AMLODIPINE BESYLATE 5 MG PO TABS: 5.0000 mg | ORAL_TABLET | Freq: Every day | ORAL | 3 refills | Status: DC

## 2019-05-23 MED ORDER — DILTIAZEM HCL ER COATED BEADS 120 MG PO CP24: 120.0000 mg | ORAL_CAPSULE | Freq: Every day | ORAL | 3 refills | Status: DC

## 2019-05-23 NOTE — Patient Instructions (Addendum)
I am glad your cold is improving.  For your blood pressure 1. Stay on the benazapril 2. I started a new medication, diltiazem, start that right away.  One pill each night. 3. Do not let the pharmacy give you amlodipine.  I will call them 4. After you have been on the diltiazem for 2 days, stop the metoprolol.  Stay off of it long enough to figure out whether it is affecting your memory (2 weeks?) I will likely restart the metoprolol if you figure out that it is not affecting your memory.   You call me after you have figured it out and we will talk to be clear about what we're doing.   In the meantime, your blood pressure was high today.  Try to get someone at [facility] to check your blood pressure 2 or 3 times over the next couple weeks.  Plan to tell me those numbers when you call in two weeks.

## 2019-05-24 ENCOUNTER — Encounter: Payer: Self-pay | Admitting: Family Medicine

## 2019-05-24 DIAGNOSIS — R41841 Cognitive communication deficit: Secondary | ICD-10-CM | POA: Diagnosis not present

## 2019-05-24 DIAGNOSIS — R413 Other amnesia: Secondary | ICD-10-CM | POA: Insufficient documentation

## 2019-05-24 DIAGNOSIS — R2681 Unsteadiness on feet: Secondary | ICD-10-CM | POA: Diagnosis not present

## 2019-05-24 DIAGNOSIS — M6281 Muscle weakness (generalized): Secondary | ICD-10-CM | POA: Diagnosis not present

## 2019-05-24 DIAGNOSIS — R296 Repeated falls: Secondary | ICD-10-CM | POA: Diagnosis not present

## 2019-05-24 DIAGNOSIS — M5136 Other intervertebral disc degeneration, lumbar region: Secondary | ICD-10-CM | POA: Diagnosis not present

## 2019-05-24 NOTE — Assessment & Plan Note (Signed)
Subjective.  Trial off metoprolol to see if improves.  Observe.

## 2019-05-24 NOTE — Assessment & Plan Note (Signed)
Poor control  Add diltiazem.  Trial off metoprolol due to concern for CNS clouding.  FU two weeks.

## 2019-05-24 NOTE — Progress Notes (Signed)
    SUBJECTIVE:   CHIEF COMPLAINT / HPI:   Bad cold and more. When she made the appointment, she had a "bad cold" with sore throat and nasal congestion x 1 week.  She has begun to improve since making the appointment.  Memory concerns.  She feels not nearly as sharp as previously.  Still driving and taking care of all her finances.  She is concerned because she read that metoprolol may cause memory problems.  In reviewing med list, also on gabapentin but actually taking only 100 mg qhs.  Hypertension- BP noted to be quite elevated.  She has only had mild previous elevations.  Denies CP or SOB.  Intollerant of amlodipine in the past (leg swelling.)  Also intollerant of hctz (hyponatremia.  Noted to have irregular HR.  She has no sense of palpitations or rapid heart rate.  Denies syncope or lightheadedness.    OBJECTIVE:   BP (!) 198/74   Pulse 79   Wt 157 lb (71.2 kg)   SpO2 99%   BMI 26.13 kg/m   BP confirmed and noted to have irregular HR Throat normal Neck no sig nodes. Lungs clear Cardiac irregular irregular.  No m or g Ext trace bilateral edema.  ASSESSMENT/PLAN:   Cardiac arrhythmia EKG shows PACs and PVCs.  PACs present on previous reading.  She is asymptomatic.  Lytes nl in August.  No diuretics. Will observe.  Hypertension Poor control  Add diltiazem.  Trial off metoprolol due to concern for CNS clouding.  FU two weeks.  Memory change Subjective.  Trial off metoprolol to see if improves.  Observe.  Seasonal allergic rhinitis due to pollen Unclear if URI or seasonal allergies.  Regardless of specific dx, improving and no further WU needed.     [name], MD [place]

## 2019-05-24 NOTE — Assessment & Plan Note (Signed)
EKG shows PACs and PVCs.  PACs present on previous reading.  She is asymptomatic.  Lytes nl in August.  No diuretics. Will observe.

## 2019-05-24 NOTE — Assessment & Plan Note (Signed)
Unclear if URI or seasonal allergies.  Regardless of specific dx, improving and no further WU needed.

## 2019-05-25 DIAGNOSIS — M5136 Other intervertebral disc degeneration, lumbar region: Secondary | ICD-10-CM | POA: Diagnosis not present

## 2019-05-25 DIAGNOSIS — R296 Repeated falls: Secondary | ICD-10-CM | POA: Diagnosis not present

## 2019-05-25 DIAGNOSIS — M6281 Muscle weakness (generalized): Secondary | ICD-10-CM | POA: Diagnosis not present

## 2019-05-25 DIAGNOSIS — R2681 Unsteadiness on feet: Secondary | ICD-10-CM | POA: Diagnosis not present

## 2019-05-25 DIAGNOSIS — R41841 Cognitive communication deficit: Secondary | ICD-10-CM | POA: Diagnosis not present

## 2019-05-28 DIAGNOSIS — R41841 Cognitive communication deficit: Secondary | ICD-10-CM | POA: Diagnosis not present

## 2019-05-28 DIAGNOSIS — M6281 Muscle weakness (generalized): Secondary | ICD-10-CM | POA: Diagnosis not present

## 2019-05-28 DIAGNOSIS — R2681 Unsteadiness on feet: Secondary | ICD-10-CM | POA: Diagnosis not present

## 2019-05-28 DIAGNOSIS — M5136 Other intervertebral disc degeneration, lumbar region: Secondary | ICD-10-CM | POA: Diagnosis not present

## 2019-05-28 DIAGNOSIS — R296 Repeated falls: Secondary | ICD-10-CM | POA: Diagnosis not present

## 2019-05-29 DIAGNOSIS — M6281 Muscle weakness (generalized): Secondary | ICD-10-CM | POA: Diagnosis not present

## 2019-05-29 DIAGNOSIS — M5136 Other intervertebral disc degeneration, lumbar region: Secondary | ICD-10-CM | POA: Diagnosis not present

## 2019-05-29 DIAGNOSIS — R2681 Unsteadiness on feet: Secondary | ICD-10-CM | POA: Diagnosis not present

## 2019-05-29 DIAGNOSIS — R41841 Cognitive communication deficit: Secondary | ICD-10-CM | POA: Diagnosis not present

## 2019-05-29 DIAGNOSIS — R296 Repeated falls: Secondary | ICD-10-CM | POA: Diagnosis not present

## 2019-05-30 DIAGNOSIS — M6281 Muscle weakness (generalized): Secondary | ICD-10-CM | POA: Diagnosis not present

## 2019-05-30 DIAGNOSIS — R2681 Unsteadiness on feet: Secondary | ICD-10-CM | POA: Diagnosis not present

## 2019-05-30 DIAGNOSIS — M5136 Other intervertebral disc degeneration, lumbar region: Secondary | ICD-10-CM | POA: Diagnosis not present

## 2019-05-30 DIAGNOSIS — R296 Repeated falls: Secondary | ICD-10-CM | POA: Diagnosis not present

## 2019-05-30 DIAGNOSIS — R41841 Cognitive communication deficit: Secondary | ICD-10-CM | POA: Diagnosis not present

## 2019-05-31 DIAGNOSIS — M6281 Muscle weakness (generalized): Secondary | ICD-10-CM | POA: Diagnosis not present

## 2019-05-31 DIAGNOSIS — R2681 Unsteadiness on feet: Secondary | ICD-10-CM | POA: Diagnosis not present

## 2019-05-31 DIAGNOSIS — M5136 Other intervertebral disc degeneration, lumbar region: Secondary | ICD-10-CM | POA: Diagnosis not present

## 2019-05-31 DIAGNOSIS — R296 Repeated falls: Secondary | ICD-10-CM | POA: Diagnosis not present

## 2019-05-31 DIAGNOSIS — R41841 Cognitive communication deficit: Secondary | ICD-10-CM | POA: Diagnosis not present

## 2019-06-01 DIAGNOSIS — R41841 Cognitive communication deficit: Secondary | ICD-10-CM | POA: Diagnosis not present

## 2019-06-01 DIAGNOSIS — M6281 Muscle weakness (generalized): Secondary | ICD-10-CM | POA: Diagnosis not present

## 2019-06-01 DIAGNOSIS — R296 Repeated falls: Secondary | ICD-10-CM | POA: Diagnosis not present

## 2019-06-01 DIAGNOSIS — R2681 Unsteadiness on feet: Secondary | ICD-10-CM | POA: Diagnosis not present

## 2019-06-01 DIAGNOSIS — M5136 Other intervertebral disc degeneration, lumbar region: Secondary | ICD-10-CM | POA: Diagnosis not present

## 2019-06-04 DIAGNOSIS — M5136 Other intervertebral disc degeneration, lumbar region: Secondary | ICD-10-CM | POA: Diagnosis not present

## 2019-06-04 DIAGNOSIS — M6281 Muscle weakness (generalized): Secondary | ICD-10-CM | POA: Diagnosis not present

## 2019-06-04 DIAGNOSIS — R2681 Unsteadiness on feet: Secondary | ICD-10-CM | POA: Diagnosis not present

## 2019-06-04 DIAGNOSIS — R296 Repeated falls: Secondary | ICD-10-CM | POA: Diagnosis not present

## 2019-06-04 DIAGNOSIS — R41841 Cognitive communication deficit: Secondary | ICD-10-CM | POA: Diagnosis not present

## 2019-06-05 DIAGNOSIS — R41841 Cognitive communication deficit: Secondary | ICD-10-CM | POA: Diagnosis not present

## 2019-06-05 DIAGNOSIS — M6281 Muscle weakness (generalized): Secondary | ICD-10-CM | POA: Diagnosis not present

## 2019-06-05 DIAGNOSIS — R296 Repeated falls: Secondary | ICD-10-CM | POA: Diagnosis not present

## 2019-06-05 DIAGNOSIS — M5136 Other intervertebral disc degeneration, lumbar region: Secondary | ICD-10-CM | POA: Diagnosis not present

## 2019-06-05 DIAGNOSIS — R2681 Unsteadiness on feet: Secondary | ICD-10-CM | POA: Diagnosis not present

## 2019-06-06 DIAGNOSIS — R296 Repeated falls: Secondary | ICD-10-CM | POA: Diagnosis not present

## 2019-06-06 DIAGNOSIS — M6281 Muscle weakness (generalized): Secondary | ICD-10-CM | POA: Diagnosis not present

## 2019-06-06 DIAGNOSIS — R41841 Cognitive communication deficit: Secondary | ICD-10-CM | POA: Diagnosis not present

## 2019-06-06 DIAGNOSIS — R2681 Unsteadiness on feet: Secondary | ICD-10-CM | POA: Diagnosis not present

## 2019-06-06 DIAGNOSIS — M5136 Other intervertebral disc degeneration, lumbar region: Secondary | ICD-10-CM | POA: Diagnosis not present

## 2019-06-07 DIAGNOSIS — R2681 Unsteadiness on feet: Secondary | ICD-10-CM | POA: Diagnosis not present

## 2019-06-07 DIAGNOSIS — R296 Repeated falls: Secondary | ICD-10-CM | POA: Diagnosis not present

## 2019-06-07 DIAGNOSIS — M6281 Muscle weakness (generalized): Secondary | ICD-10-CM | POA: Diagnosis not present

## 2019-06-07 DIAGNOSIS — R41841 Cognitive communication deficit: Secondary | ICD-10-CM | POA: Diagnosis not present

## 2019-06-07 DIAGNOSIS — M5136 Other intervertebral disc degeneration, lumbar region: Secondary | ICD-10-CM | POA: Diagnosis not present

## 2019-06-08 ENCOUNTER — Telehealth: Payer: Self-pay | Admitting: Family Medicine

## 2019-06-08 DIAGNOSIS — M5136 Other intervertebral disc degeneration, lumbar region: Secondary | ICD-10-CM | POA: Diagnosis not present

## 2019-06-08 DIAGNOSIS — I517 Cardiomegaly: Secondary | ICD-10-CM | POA: Diagnosis not present

## 2019-06-08 DIAGNOSIS — J984 Other disorders of lung: Secondary | ICD-10-CM | POA: Diagnosis not present

## 2019-06-08 DIAGNOSIS — N3090 Cystitis, unspecified without hematuria: Secondary | ICD-10-CM | POA: Diagnosis not present

## 2019-06-08 DIAGNOSIS — R296 Repeated falls: Secondary | ICD-10-CM | POA: Diagnosis not present

## 2019-06-08 DIAGNOSIS — Z79899 Other long term (current) drug therapy: Secondary | ICD-10-CM | POA: Diagnosis not present

## 2019-06-08 DIAGNOSIS — I10 Essential (primary) hypertension: Secondary | ICD-10-CM | POA: Diagnosis not present

## 2019-06-08 DIAGNOSIS — R0989 Other specified symptoms and signs involving the circulatory and respiratory systems: Secondary | ICD-10-CM | POA: Diagnosis not present

## 2019-06-08 DIAGNOSIS — M6281 Muscle weakness (generalized): Secondary | ICD-10-CM | POA: Diagnosis not present

## 2019-06-08 DIAGNOSIS — Z791 Long term (current) use of non-steroidal anti-inflammatories (NSAID): Secondary | ICD-10-CM | POA: Diagnosis not present

## 2019-06-08 DIAGNOSIS — Z91018 Allergy to other foods: Secondary | ICD-10-CM | POA: Diagnosis not present

## 2019-06-08 DIAGNOSIS — R2681 Unsteadiness on feet: Secondary | ICD-10-CM | POA: Diagnosis not present

## 2019-06-08 DIAGNOSIS — R41841 Cognitive communication deficit: Secondary | ICD-10-CM | POA: Diagnosis not present

## 2019-06-08 DIAGNOSIS — R519 Headache, unspecified: Secondary | ICD-10-CM | POA: Diagnosis not present

## 2019-06-08 MED ORDER — BENAZEPRIL HCL 10 MG PO TABS: 20.0000 mg | ORAL_TABLET | Freq: Every day | ORAL | 3 refills | Status: DC

## 2019-06-08 NOTE — Telephone Encounter (Signed)
Patient called.  She has weaned off the metoprolol and is now taking benazepril and diltiazem.  HR=60  BP=190/93.  Will increase benazepril to 20 mg daily.  She will call back next week.

## 2019-06-11 DIAGNOSIS — M6281 Muscle weakness (generalized): Secondary | ICD-10-CM | POA: Diagnosis not present

## 2019-06-11 DIAGNOSIS — R296 Repeated falls: Secondary | ICD-10-CM | POA: Diagnosis not present

## 2019-06-11 DIAGNOSIS — R41841 Cognitive communication deficit: Secondary | ICD-10-CM | POA: Diagnosis not present

## 2019-06-11 DIAGNOSIS — R2681 Unsteadiness on feet: Secondary | ICD-10-CM | POA: Diagnosis not present

## 2019-06-11 DIAGNOSIS — M5136 Other intervertebral disc degeneration, lumbar region: Secondary | ICD-10-CM | POA: Diagnosis not present

## 2019-06-12 DIAGNOSIS — R296 Repeated falls: Secondary | ICD-10-CM | POA: Diagnosis not present

## 2019-06-12 DIAGNOSIS — R41841 Cognitive communication deficit: Secondary | ICD-10-CM | POA: Diagnosis not present

## 2019-06-12 DIAGNOSIS — M5136 Other intervertebral disc degeneration, lumbar region: Secondary | ICD-10-CM | POA: Diagnosis not present

## 2019-06-12 DIAGNOSIS — R2681 Unsteadiness on feet: Secondary | ICD-10-CM | POA: Diagnosis not present

## 2019-06-12 DIAGNOSIS — M6281 Muscle weakness (generalized): Secondary | ICD-10-CM | POA: Diagnosis not present

## 2019-06-13 DIAGNOSIS — R41841 Cognitive communication deficit: Secondary | ICD-10-CM | POA: Diagnosis not present

## 2019-06-13 DIAGNOSIS — M5136 Other intervertebral disc degeneration, lumbar region: Secondary | ICD-10-CM | POA: Diagnosis not present

## 2019-06-13 DIAGNOSIS — R296 Repeated falls: Secondary | ICD-10-CM | POA: Diagnosis not present

## 2019-06-13 DIAGNOSIS — R2681 Unsteadiness on feet: Secondary | ICD-10-CM | POA: Diagnosis not present

## 2019-06-13 DIAGNOSIS — M6281 Muscle weakness (generalized): Secondary | ICD-10-CM | POA: Diagnosis not present

## 2019-06-14 DIAGNOSIS — R2681 Unsteadiness on feet: Secondary | ICD-10-CM | POA: Diagnosis not present

## 2019-06-14 DIAGNOSIS — R41841 Cognitive communication deficit: Secondary | ICD-10-CM | POA: Diagnosis not present

## 2019-06-14 DIAGNOSIS — M6281 Muscle weakness (generalized): Secondary | ICD-10-CM | POA: Diagnosis not present

## 2019-06-14 DIAGNOSIS — R296 Repeated falls: Secondary | ICD-10-CM | POA: Diagnosis not present

## 2019-06-14 DIAGNOSIS — M5136 Other intervertebral disc degeneration, lumbar region: Secondary | ICD-10-CM | POA: Diagnosis not present

## 2019-06-15 DIAGNOSIS — M5136 Other intervertebral disc degeneration, lumbar region: Secondary | ICD-10-CM | POA: Diagnosis not present

## 2019-06-15 DIAGNOSIS — R2681 Unsteadiness on feet: Secondary | ICD-10-CM | POA: Diagnosis not present

## 2019-06-15 DIAGNOSIS — R296 Repeated falls: Secondary | ICD-10-CM | POA: Diagnosis not present

## 2019-06-15 DIAGNOSIS — M6281 Muscle weakness (generalized): Secondary | ICD-10-CM | POA: Diagnosis not present

## 2019-06-15 DIAGNOSIS — R41841 Cognitive communication deficit: Secondary | ICD-10-CM | POA: Diagnosis not present

## 2019-06-18 DIAGNOSIS — M6281 Muscle weakness (generalized): Secondary | ICD-10-CM | POA: Diagnosis not present

## 2019-06-18 DIAGNOSIS — R2681 Unsteadiness on feet: Secondary | ICD-10-CM | POA: Diagnosis not present

## 2019-06-18 DIAGNOSIS — R296 Repeated falls: Secondary | ICD-10-CM | POA: Diagnosis not present

## 2019-06-18 DIAGNOSIS — M5136 Other intervertebral disc degeneration, lumbar region: Secondary | ICD-10-CM | POA: Diagnosis not present

## 2019-06-18 DIAGNOSIS — R41841 Cognitive communication deficit: Secondary | ICD-10-CM | POA: Diagnosis not present

## 2019-06-19 ENCOUNTER — Telehealth: Payer: Self-pay | Admitting: Family Medicine

## 2019-06-19 DIAGNOSIS — M5136 Other intervertebral disc degeneration, lumbar region: Secondary | ICD-10-CM | POA: Diagnosis not present

## 2019-06-19 DIAGNOSIS — R2681 Unsteadiness on feet: Secondary | ICD-10-CM | POA: Diagnosis not present

## 2019-06-19 DIAGNOSIS — M6281 Muscle weakness (generalized): Secondary | ICD-10-CM | POA: Diagnosis not present

## 2019-06-19 DIAGNOSIS — R41841 Cognitive communication deficit: Secondary | ICD-10-CM | POA: Diagnosis not present

## 2019-06-19 DIAGNOSIS — R296 Repeated falls: Secondary | ICD-10-CM | POA: Diagnosis not present

## 2019-06-19 NOTE — Telephone Encounter (Signed)
Pt. Has canceled her appt. And requesting Dr. Andria Frames to call her at [phone] to discuss her medication

## 2019-06-19 NOTE — Telephone Encounter (Signed)
Called.  BP initially dropped with increase benazapril and now is up again.  I may want to go from 30 mg to 40 mg but I want to check a BMP first.  She will reschedule and see me in 10 days.  Had to cancel because of her demented husband's dental surgery.

## 2019-06-20 DIAGNOSIS — R41841 Cognitive communication deficit: Secondary | ICD-10-CM | POA: Diagnosis not present

## 2019-06-20 DIAGNOSIS — M6281 Muscle weakness (generalized): Secondary | ICD-10-CM | POA: Diagnosis not present

## 2019-06-20 DIAGNOSIS — R2681 Unsteadiness on feet: Secondary | ICD-10-CM | POA: Diagnosis not present

## 2019-06-20 DIAGNOSIS — R296 Repeated falls: Secondary | ICD-10-CM | POA: Diagnosis not present

## 2019-06-20 DIAGNOSIS — M5136 Other intervertebral disc degeneration, lumbar region: Secondary | ICD-10-CM | POA: Diagnosis not present

## 2019-06-22 DIAGNOSIS — R296 Repeated falls: Secondary | ICD-10-CM | POA: Diagnosis not present

## 2019-06-22 DIAGNOSIS — R2681 Unsteadiness on feet: Secondary | ICD-10-CM | POA: Diagnosis not present

## 2019-06-22 DIAGNOSIS — R41841 Cognitive communication deficit: Secondary | ICD-10-CM | POA: Diagnosis not present

## 2019-06-22 DIAGNOSIS — M6281 Muscle weakness (generalized): Secondary | ICD-10-CM | POA: Diagnosis not present

## 2019-06-22 DIAGNOSIS — M5136 Other intervertebral disc degeneration, lumbar region: Secondary | ICD-10-CM | POA: Diagnosis not present

## 2019-06-25 ENCOUNTER — Ambulatory Visit: Payer: Medicare Other | Admitting: Family Medicine

## 2019-06-25 DIAGNOSIS — R296 Repeated falls: Secondary | ICD-10-CM | POA: Diagnosis not present

## 2019-06-25 DIAGNOSIS — R2681 Unsteadiness on feet: Secondary | ICD-10-CM | POA: Diagnosis not present

## 2019-06-25 DIAGNOSIS — M5136 Other intervertebral disc degeneration, lumbar region: Secondary | ICD-10-CM | POA: Diagnosis not present

## 2019-06-25 DIAGNOSIS — R41841 Cognitive communication deficit: Secondary | ICD-10-CM | POA: Diagnosis not present

## 2019-06-25 DIAGNOSIS — M6281 Muscle weakness (generalized): Secondary | ICD-10-CM | POA: Diagnosis not present

## 2019-06-26 DIAGNOSIS — M5136 Other intervertebral disc degeneration, lumbar region: Secondary | ICD-10-CM | POA: Diagnosis not present

## 2019-06-26 DIAGNOSIS — R2681 Unsteadiness on feet: Secondary | ICD-10-CM | POA: Diagnosis not present

## 2019-06-26 DIAGNOSIS — M6281 Muscle weakness (generalized): Secondary | ICD-10-CM | POA: Diagnosis not present

## 2019-06-26 DIAGNOSIS — R296 Repeated falls: Secondary | ICD-10-CM | POA: Diagnosis not present

## 2019-06-26 DIAGNOSIS — R41841 Cognitive communication deficit: Secondary | ICD-10-CM | POA: Diagnosis not present

## 2019-06-27 DIAGNOSIS — R41841 Cognitive communication deficit: Secondary | ICD-10-CM | POA: Diagnosis not present

## 2019-06-27 DIAGNOSIS — R2681 Unsteadiness on feet: Secondary | ICD-10-CM | POA: Diagnosis not present

## 2019-06-27 DIAGNOSIS — R296 Repeated falls: Secondary | ICD-10-CM | POA: Diagnosis not present

## 2019-06-27 DIAGNOSIS — M6281 Muscle weakness (generalized): Secondary | ICD-10-CM | POA: Diagnosis not present

## 2019-06-27 DIAGNOSIS — M5136 Other intervertebral disc degeneration, lumbar region: Secondary | ICD-10-CM | POA: Diagnosis not present

## 2019-06-28 ENCOUNTER — Other Ambulatory Visit: Payer: Self-pay

## 2019-06-28 ENCOUNTER — Encounter: Payer: Self-pay | Admitting: Family Medicine

## 2019-06-28 ENCOUNTER — Ambulatory Visit (INDEPENDENT_AMBULATORY_CARE_PROVIDER_SITE_OTHER): Payer: Medicare Other | Admitting: Family Medicine

## 2019-06-28 VITALS — BP 182/80 | HR 52 | Ht 65.0 in | Wt 161.8 lb

## 2019-06-28 DIAGNOSIS — M5136 Other intervertebral disc degeneration, lumbar region: Secondary | ICD-10-CM | POA: Diagnosis not present

## 2019-06-28 DIAGNOSIS — I10 Essential (primary) hypertension: Secondary | ICD-10-CM | POA: Diagnosis not present

## 2019-06-28 DIAGNOSIS — R41841 Cognitive communication deficit: Secondary | ICD-10-CM | POA: Diagnosis not present

## 2019-06-28 DIAGNOSIS — M6281 Muscle weakness (generalized): Secondary | ICD-10-CM | POA: Diagnosis not present

## 2019-06-28 DIAGNOSIS — R350 Frequency of micturition: Secondary | ICD-10-CM | POA: Insufficient documentation

## 2019-06-28 DIAGNOSIS — R739 Hyperglycemia, unspecified: Secondary | ICD-10-CM

## 2019-06-28 DIAGNOSIS — R2681 Unsteadiness on feet: Secondary | ICD-10-CM | POA: Diagnosis not present

## 2019-06-28 DIAGNOSIS — R296 Repeated falls: Secondary | ICD-10-CM | POA: Diagnosis not present

## 2019-06-28 LAB — POCT URINALYSIS DIP (MANUAL ENTRY)
Blood, UA: NEGATIVE
Glucose, UA: NEGATIVE mg/dL
Leukocytes, UA: NEGATIVE
Nitrite, UA: NEGATIVE
Protein Ur, POC: 100 mg/dL — AB
Spec Grav, UA: 1.025 (ref 1.010–1.025)
Urobilinogen, UA: 1 E.U./dL
pH, UA: 7 (ref 5.0–8.0)

## 2019-06-28 LAB — POCT GLYCOSYLATED HEMOGLOBIN (HGB A1C): Hemoglobin A1C: 5.2 % (ref 4.0–5.6)

## 2019-06-28 MED ORDER — DILTIAZEM HCL ER COATED BEADS 120 MG PO CP24: 240.0000 mg | ORAL_CAPSULE | Freq: Every day | ORAL | Status: DC

## 2019-06-28 NOTE — Progress Notes (Signed)
ur

## 2019-06-28 NOTE — Assessment & Plan Note (Addendum)
Systolic hypertension continues.  HR=52.  Cannot increase diltiazem.   Recent increase in ACE.  Recheck BMP Will need more antihypertensive Rx - but wait for BMP result before making changes.    Called with results.  I will: 1. Decrease diltiazem to 180 mg daily due to bradycardia. 2. Increase benazapril to 40 mg dialy. Continue to monitor BP.  New prescriptions sent.

## 2019-06-28 NOTE — Patient Instructions (Signed)
I will call tomorrow with blood tests. Likely, I will not have the urine culture until Monday. I will need to make some change in your blood pressure medicines.  The change depends on the blood work.  The goal is to get the top number below 160 most of the time.

## 2019-06-28 NOTE — Progress Notes (Signed)
    SUBJECTIVE:   CHIEF COMPLAINT / HPI:   FU hypertension and UTI Patient has doubled up on both ACE and diltiazem.  Systolics improved but still high.  Running between 160-180 based on multiple reads at the nursing home. Has urinary frequency.  Recently dxed as UTI at [facility].  Results not in care everywhere. Finished antibiotics several days ago.  No fever, urgency or dysuria.     OBJECTIVE:   BP (!) 182/80   Pulse (!) 52   Ht 5\' 5"  (1.651 m)   Wt 161 lb 12.8 oz (73.4 kg)   SpO2 98%   BMI 26.92 kg/m   Lungs clear Cardiac RRR without m or g Trace edema.  ASSESSMENT/PLAN:   Hypertension Systolic hypertension continues.  HR=52.  Cannot increase diltiazem.   Recent increase in ACE.  Recheck BMP Will need more antihypertensive Rx - but wait for BMP result before making changes.    Urinary frequency Without urgency, fever or dysuria.  Reassuring UA. Urine culture.  Previously high random BS.  Will check for DM  No empiric treatment.  Await test results.       [name], MD [place]

## 2019-06-28 NOTE — Assessment & Plan Note (Signed)
Without urgency, fever or dysuria.  Reassuring UA. Urine culture.  Previously high random BS.  Will check for DM  No empiric treatment.  Await test results.

## 2019-06-29 DIAGNOSIS — R41841 Cognitive communication deficit: Secondary | ICD-10-CM | POA: Diagnosis not present

## 2019-06-29 DIAGNOSIS — R2681 Unsteadiness on feet: Secondary | ICD-10-CM | POA: Diagnosis not present

## 2019-06-29 DIAGNOSIS — R296 Repeated falls: Secondary | ICD-10-CM | POA: Diagnosis not present

## 2019-06-29 DIAGNOSIS — M6281 Muscle weakness (generalized): Secondary | ICD-10-CM | POA: Diagnosis not present

## 2019-06-29 DIAGNOSIS — M5136 Other intervertebral disc degeneration, lumbar region: Secondary | ICD-10-CM | POA: Diagnosis not present

## 2019-06-29 LAB — BASIC METABOLIC PANEL
BUN/Creatinine Ratio: 21 (ref 12–28)
BUN: 21 mg/dL (ref 8–27)
CO2: 19 mmol/L — ABNORMAL LOW (ref 20–29)
Calcium: 9.8 mg/dL (ref 8.7–10.3)
Chloride: 99 mmol/L (ref 96–106)
Creatinine, Ser: 1.01 mg/dL — ABNORMAL HIGH (ref 0.57–1.00)
GFR calc Af Amer: 59 mL/min/{1.73_m2} — ABNORMAL LOW (ref >59)
GFR calc non Af Amer: 51 mL/min/{1.73_m2} — ABNORMAL LOW (ref >59)
Glucose: 87 mg/dL (ref 65–99)
Potassium: 4.9 mmol/L (ref 3.5–5.2)
Sodium: 138 mmol/L (ref 134–144)

## 2019-06-29 MED ORDER — BENAZEPRIL HCL 40 MG PO TABS: 40.0000 mg | ORAL_TABLET | Freq: Every day | ORAL | 3 refills | Status: DC

## 2019-06-29 MED ORDER — DILTIAZEM HCL ER COATED BEADS 180 MG PO CP24: 180.0000 mg | ORAL_CAPSULE | Freq: Every day | ORAL | 3 refills | Status: DC

## 2019-06-29 NOTE — Addendum Note (Signed)
Addended by: [name] on: 06/29/2019 09:25 AM   Modules accepted: Orders

## 2019-07-01 DIAGNOSIS — M5136 Other intervertebral disc degeneration, lumbar region: Secondary | ICD-10-CM | POA: Diagnosis not present

## 2019-07-01 DIAGNOSIS — R41841 Cognitive communication deficit: Secondary | ICD-10-CM | POA: Diagnosis not present

## 2019-07-01 DIAGNOSIS — M6281 Muscle weakness (generalized): Secondary | ICD-10-CM | POA: Diagnosis not present

## 2019-07-01 DIAGNOSIS — R2681 Unsteadiness on feet: Secondary | ICD-10-CM | POA: Diagnosis not present

## 2019-07-01 DIAGNOSIS — R296 Repeated falls: Secondary | ICD-10-CM | POA: Diagnosis not present

## 2019-07-02 DIAGNOSIS — M5136 Other intervertebral disc degeneration, lumbar region: Secondary | ICD-10-CM | POA: Diagnosis not present

## 2019-07-02 DIAGNOSIS — M6281 Muscle weakness (generalized): Secondary | ICD-10-CM | POA: Diagnosis not present

## 2019-07-02 DIAGNOSIS — R296 Repeated falls: Secondary | ICD-10-CM | POA: Diagnosis not present

## 2019-07-02 DIAGNOSIS — R41841 Cognitive communication deficit: Secondary | ICD-10-CM | POA: Diagnosis not present

## 2019-07-02 DIAGNOSIS — R2681 Unsteadiness on feet: Secondary | ICD-10-CM | POA: Diagnosis not present

## 2019-07-02 LAB — URINE CULTURE

## 2019-07-02 NOTE — Telephone Encounter (Signed)
Called with prelim report.  No fever and feels OK.  Still has frequency.  No treatment until I see final report.

## 2019-07-03 ENCOUNTER — Telehealth: Payer: Self-pay | Admitting: Family Medicine

## 2019-07-03 DIAGNOSIS — R41841 Cognitive communication deficit: Secondary | ICD-10-CM | POA: Diagnosis not present

## 2019-07-03 DIAGNOSIS — M5136 Other intervertebral disc degeneration, lumbar region: Secondary | ICD-10-CM | POA: Diagnosis not present

## 2019-07-03 DIAGNOSIS — R2681 Unsteadiness on feet: Secondary | ICD-10-CM | POA: Diagnosis not present

## 2019-07-03 DIAGNOSIS — R296 Repeated falls: Secondary | ICD-10-CM | POA: Diagnosis not present

## 2019-07-03 DIAGNOSIS — M6281 Muscle weakness (generalized): Secondary | ICD-10-CM | POA: Diagnosis not present

## 2019-07-03 NOTE — Telephone Encounter (Signed)
Called and informed no UTI, no treatment needed.

## 2019-07-04 DIAGNOSIS — M6281 Muscle weakness (generalized): Secondary | ICD-10-CM | POA: Diagnosis not present

## 2019-07-04 DIAGNOSIS — R2681 Unsteadiness on feet: Secondary | ICD-10-CM | POA: Diagnosis not present

## 2019-07-04 DIAGNOSIS — R296 Repeated falls: Secondary | ICD-10-CM | POA: Diagnosis not present

## 2019-07-04 DIAGNOSIS — R41841 Cognitive communication deficit: Secondary | ICD-10-CM | POA: Diagnosis not present

## 2019-07-04 DIAGNOSIS — M5136 Other intervertebral disc degeneration, lumbar region: Secondary | ICD-10-CM | POA: Diagnosis not present

## 2019-07-05 DIAGNOSIS — M5136 Other intervertebral disc degeneration, lumbar region: Secondary | ICD-10-CM | POA: Diagnosis not present

## 2019-07-05 DIAGNOSIS — R296 Repeated falls: Secondary | ICD-10-CM | POA: Diagnosis not present

## 2019-07-05 DIAGNOSIS — R2681 Unsteadiness on feet: Secondary | ICD-10-CM | POA: Diagnosis not present

## 2019-07-05 DIAGNOSIS — R41841 Cognitive communication deficit: Secondary | ICD-10-CM | POA: Diagnosis not present

## 2019-07-05 DIAGNOSIS — M6281 Muscle weakness (generalized): Secondary | ICD-10-CM | POA: Diagnosis not present

## 2019-07-06 DIAGNOSIS — M5136 Other intervertebral disc degeneration, lumbar region: Secondary | ICD-10-CM | POA: Diagnosis not present

## 2019-07-06 DIAGNOSIS — R296 Repeated falls: Secondary | ICD-10-CM | POA: Diagnosis not present

## 2019-07-06 DIAGNOSIS — R41841 Cognitive communication deficit: Secondary | ICD-10-CM | POA: Diagnosis not present

## 2019-07-06 DIAGNOSIS — R2681 Unsteadiness on feet: Secondary | ICD-10-CM | POA: Diagnosis not present

## 2019-07-06 DIAGNOSIS — M6281 Muscle weakness (generalized): Secondary | ICD-10-CM | POA: Diagnosis not present

## 2019-07-09 DIAGNOSIS — R2681 Unsteadiness on feet: Secondary | ICD-10-CM | POA: Diagnosis not present

## 2019-07-09 DIAGNOSIS — M6281 Muscle weakness (generalized): Secondary | ICD-10-CM | POA: Diagnosis not present

## 2019-07-09 DIAGNOSIS — R41841 Cognitive communication deficit: Secondary | ICD-10-CM | POA: Diagnosis not present

## 2019-07-09 DIAGNOSIS — R296 Repeated falls: Secondary | ICD-10-CM | POA: Diagnosis not present

## 2019-07-09 DIAGNOSIS — M5136 Other intervertebral disc degeneration, lumbar region: Secondary | ICD-10-CM | POA: Diagnosis not present

## 2019-07-11 DIAGNOSIS — M5136 Other intervertebral disc degeneration, lumbar region: Secondary | ICD-10-CM | POA: Diagnosis not present

## 2019-07-11 DIAGNOSIS — R2681 Unsteadiness on feet: Secondary | ICD-10-CM | POA: Diagnosis not present

## 2019-07-11 DIAGNOSIS — R296 Repeated falls: Secondary | ICD-10-CM | POA: Diagnosis not present

## 2019-07-11 DIAGNOSIS — R41841 Cognitive communication deficit: Secondary | ICD-10-CM | POA: Diagnosis not present

## 2019-07-11 DIAGNOSIS — M6281 Muscle weakness (generalized): Secondary | ICD-10-CM | POA: Diagnosis not present

## 2019-07-12 DIAGNOSIS — M5136 Other intervertebral disc degeneration, lumbar region: Secondary | ICD-10-CM | POA: Diagnosis not present

## 2019-07-12 DIAGNOSIS — R2681 Unsteadiness on feet: Secondary | ICD-10-CM | POA: Diagnosis not present

## 2019-07-12 DIAGNOSIS — R41841 Cognitive communication deficit: Secondary | ICD-10-CM | POA: Diagnosis not present

## 2019-07-12 DIAGNOSIS — M6281 Muscle weakness (generalized): Secondary | ICD-10-CM | POA: Diagnosis not present

## 2019-07-12 DIAGNOSIS — R296 Repeated falls: Secondary | ICD-10-CM | POA: Diagnosis not present

## 2019-07-13 DIAGNOSIS — R41841 Cognitive communication deficit: Secondary | ICD-10-CM | POA: Diagnosis not present

## 2019-07-13 DIAGNOSIS — R296 Repeated falls: Secondary | ICD-10-CM | POA: Diagnosis not present

## 2019-07-13 DIAGNOSIS — M6281 Muscle weakness (generalized): Secondary | ICD-10-CM | POA: Diagnosis not present

## 2019-07-13 DIAGNOSIS — R2681 Unsteadiness on feet: Secondary | ICD-10-CM | POA: Diagnosis not present

## 2019-07-13 DIAGNOSIS — M5136 Other intervertebral disc degeneration, lumbar region: Secondary | ICD-10-CM | POA: Diagnosis not present

## 2019-07-20 ENCOUNTER — Ambulatory Visit: Payer: Medicare Other | Admitting: Family Medicine

## 2019-07-20 ENCOUNTER — Other Ambulatory Visit: Payer: Self-pay

## 2019-07-20 ENCOUNTER — Ambulatory Visit (INDEPENDENT_AMBULATORY_CARE_PROVIDER_SITE_OTHER): Payer: Medicare Other | Admitting: Family Medicine

## 2019-07-20 VITALS — BP 190/84 | HR 54 | Ht 65.0 in | Wt 161.8 lb

## 2019-07-20 DIAGNOSIS — R350 Frequency of micturition: Secondary | ICD-10-CM

## 2019-07-20 DIAGNOSIS — I10 Essential (primary) hypertension: Secondary | ICD-10-CM

## 2019-07-20 LAB — POCT URINALYSIS DIP (MANUAL ENTRY)
Bilirubin, UA: NEGATIVE
Blood, UA: NEGATIVE
Glucose, UA: NEGATIVE mg/dL
Leukocytes, UA: NEGATIVE
Nitrite, UA: NEGATIVE
Protein Ur, POC: 100 mg/dL — AB
Spec Grav, UA: >=1.03 — AB (ref 1.010–1.025)
Urobilinogen, UA: 0.2 E.U./dL
pH, UA: 6.5 (ref 5.0–8.0)

## 2019-07-20 LAB — POCT UA - MICROSCOPIC ONLY

## 2019-07-20 NOTE — Assessment & Plan Note (Signed)
Patient previously at goal until last night and today.  Blood pressure today was taken twice and both times was greater than 131 systolic.  Patient asymptomatic.  We will call the patient's assisted living [facility], and ask them to check and document her blood pressure 3 times a day.  Asked patient to schedule virtual visit for a week from now.  If they continue to be elevated during this week, will need to add additional agent most likely.  Consider spironolactone or hydralazine as options are limited

## 2019-07-20 NOTE — Progress Notes (Signed)
    SUBJECTIVE:   CHIEF COMPLAINT / HPI:   Hypertension: Patient recently saw Dr. Andria Frames, her PCP, last month and changes were made to her blood pressure medication.  Since that time she has had blood pressures ranging in the 001V to 494W systolic, but last night she developed hypertension in the 967R/916 systolic range.  She had this taken several times by her nurse.  She is not having any symptoms of hypertensive emergency.  She was compliant with her medications and has not missed any doses.  Urinary urgency: Patient believes she has a UTI.  She did not have any dysuria.  For the past few months she has had increasing urinary urgency with occasional inability to make it to the bathroom in time.  She feels that when she needs to go the bathroom she has to go right now.  She does not have any leakage or dribbling otherwise.  She does not have symptoms associated with laughing, coughing, exercise.  PERTINENT  PMH / PSH: Hypertension  OBJECTIVE:   BP (!) 190/84   Pulse (!) 54   Ht 5\' 5"  (1.651 m)   Wt 161 lb 12.8 oz (73.4 kg)   SpO2 97%   BMI 26.92 kg/m   General: Alert and oriented.  Appears stated age.  No acute distress. CV: Irregular rhythm.  Normal rate.  No murmur. Pulmonary: L CTA B, no wheezing or crackles Extremities: Bilateral nonpitting edema, tortuous veins on the lower legs.  ASSESSMENT/PLAN:   Hypertension Patient previously at goal until last night and today.  Blood pressure today was taken twice and both times was greater than 384 systolic.  Patient asymptomatic.  We will call the patient's assisted living [place], and ask them to check and document her blood pressure 3 times a day.  Asked patient to schedule virtual visit for a week from now.  If they continue to be elevated during this week, will need to add additional agent most likely.  Consider spironolactone or hydralazine as options are limited  Urinary frequency Symptoms sound most  consistent with urge incontinence.  Advised patient to make behavioral changes such as timed voiding, decreasing fluid intake before bedtime, and avoiding caffeine.  Also put in PT referral for pelvic rehab as patient has done Kegel exercises in the past and says she thinks that helped previously.    Benay Pike, MD [place]

## 2019-07-20 NOTE — Patient Instructions (Addendum)
It was nice to meet you today,  For your hypertension, I would like to get more information regarding your blood pressure values.  Therefore, I would like your nurse at the facility to check your blood pressure 3 times a day and let us know with values in a week.  If they remain elevated throughout this time we can start a new medication, but I do not not want to start a new medication for a temporary problem.  For your urinary incontinence, I do not want to start a new medication before we try some physical therapy and behavioral changes.  I have sent a referral to pelvic floor physical therapy.  Somebody should call you within the next week regarding setting up an appointment.  Other things you can do are timed voids so this means going every hour to 2 hours whether you feel like you need to go or not.  Also limiting your fluid intake slightly, although I do not want you to become dehydrated.  Also not drinking caffeine during the day or drinking late at night.  All of these things can help with your incontinence.  If these techniques do not help your incontinence still, we may try starting a new medication.  Have a great day,  Clemetine Marker, MD

## 2019-07-20 NOTE — Progress Notes (Deleted)
° ° °  SUBJECTIVE:   CHIEF COMPLAINT / HPI:   BP has been in 140s over 80s.  Last night it 'spiked'.  Around the 103 systolic range.  She takes the medications at night. She started takin gthem at night a week ago.    increased urinary frequency: going on 'quite a while".  At least going back to April. Went to urologist.  But doesn't remember when.  Was told to do kegels which she tinks helped.  Not associated with coughing or exercise.  Has never tried meds for this before.    PERTINENT  PMH / PSH: ***  OBJECTIVE:   There were no vitals taken for this visit.  ***  ASSESSMENT/PLAN:   No problem-specific Assessment & Plan notes found for this encounter.     Benay Pike, MD [place]

## 2019-07-20 NOTE — Assessment & Plan Note (Signed)
Symptoms sound most consistent with urge incontinence.  Advised patient to make behavioral changes such as timed voiding, decreasing fluid intake before bedtime, and avoiding caffeine.  Also put in PT referral for pelvic rehab as patient has done Kegel exercises in the past and says she thinks that helped previously.

## 2019-07-22 ENCOUNTER — Other Ambulatory Visit: Payer: Self-pay | Admitting: Family Medicine

## 2019-07-22 DIAGNOSIS — I10 Essential (primary) hypertension: Secondary | ICD-10-CM

## 2019-07-27 ENCOUNTER — Telehealth (INDEPENDENT_AMBULATORY_CARE_PROVIDER_SITE_OTHER): Payer: Medicare Other | Admitting: Family Medicine

## 2019-07-27 ENCOUNTER — Other Ambulatory Visit: Payer: Self-pay

## 2019-07-27 DIAGNOSIS — I10 Essential (primary) hypertension: Secondary | ICD-10-CM | POA: Diagnosis not present

## 2019-07-27 NOTE — Assessment & Plan Note (Signed)
Patient was seen in the office last week because she had repeated blood pressures with systolics over 747F for approximately 1 day.  She was asymptomatic at the time.  At that time we asked the patient to check her blood pressure multiple times a day for the next week and follow-up with [place] again today.  Patient recorded her blood pressures, and was compliant with her medication during this time.  Blood pressures were improved and in the acceptable range for her age.  Most recently in the 595Z systolic over 96D diastolic.  Advised patient to continue current treatment regimen.  Informed her to contact us if she develops any new concerning symptoms or her blood pressure elevates again to what it was at her last visit.  We will forward to PCP

## 2019-07-27 NOTE — Progress Notes (Signed)
[place] Telemedicine Visit  Patient consented to have virtual visit and was identified by name and date of birth. Method of visit: Telephone  Encounter participants: Patient: Gina Bishop - located at [facility] Provider: Benay Pike - located at [facility] Others (if applicable): Also spoke with the staff at 4 sites  Chief Complaint: Hypertension  HPI: Patient states that since her last encounter here last week, her blood pressure has been improved.  She states that in the last few days it is generally in the 150s over 70s.  The highest it has gotten since she started taking her blood pressure again multiple times during the day has been 010 systolic.  She is compliant with her medication as prescribed.  She is not having symptoms such as chest pain, headache.  I spoke with the staff at 4 sites including the med tech assigned to her.  They stated that the patient will not let them check her blood pressure and she insists on checking herself.  They do state that they asked her at least once per day if she was checking her blood pressure.   ROS: per HPI  Pertinent PMHx: Hypertension, cardiac arrhythmia,  Exam:  There were no vitals taken for this visit.  Respiratory: Speaking in full sentences.  No respiratory distress.  Assessment/Plan:  Hypertension Patient was seen in the office last week because she had repeated blood pressures with systolics over 071Q for approximately 1 day.  She was asymptomatic at the time.  At that time we asked the patient to check her blood pressure multiple times a day for the next week and follow-up with [place] again today.  Patient recorded her blood pressures, and was compliant with her medication during this time.  Blood pressures were improved and in the acceptable range for her age.  Most recently in the 197J systolic over 88T diastolic.  Advised patient to continue current treatment regimen.  Informed her to contact us  if she develops any new concerning symptoms or her blood pressure elevates again to what it was at her last visit.  We will forward to PCP    Time spent during visit with patient: 12 minutes

## 2019-07-30 NOTE — Progress Notes (Signed)
I called patient.  States she is doing well.  No SOB or CP or HA.  Her BPs are mostly below 518 systolic, which is my goal for her.  No change and agree with Dr. Jeannine Kitten.

## 2019-08-10 ENCOUNTER — Telehealth: Payer: Self-pay | Admitting: *Deleted

## 2019-08-10 NOTE — Telephone Encounter (Signed)
Pt calls because she was constipated a few days ago and took laxatives.  She now c/o diarrhea x 2 days and hemorrhoids.   She reports not being able to sit comfortably.  Advised to go to Urgent care as to check hydration status and get some relief for the hemorrhoids so she was not hurting all weekend.   Pt agreeable to plan. Christen Bame, CMA

## 2019-08-15 NOTE — Telephone Encounter (Signed)
Noted and agree. 

## 2019-08-16 DIAGNOSIS — M6281 Muscle weakness (generalized): Secondary | ICD-10-CM | POA: Diagnosis not present

## 2019-08-16 DIAGNOSIS — R41841 Cognitive communication deficit: Secondary | ICD-10-CM | POA: Diagnosis not present

## 2019-08-16 DIAGNOSIS — R2681 Unsteadiness on feet: Secondary | ICD-10-CM | POA: Diagnosis not present

## 2019-08-16 DIAGNOSIS — R296 Repeated falls: Secondary | ICD-10-CM | POA: Diagnosis not present

## 2019-08-16 DIAGNOSIS — M5136 Other intervertebral disc degeneration, lumbar region: Secondary | ICD-10-CM | POA: Diagnosis not present

## 2019-08-17 DIAGNOSIS — R41841 Cognitive communication deficit: Secondary | ICD-10-CM | POA: Diagnosis not present

## 2019-08-17 DIAGNOSIS — M5136 Other intervertebral disc degeneration, lumbar region: Secondary | ICD-10-CM | POA: Diagnosis not present

## 2019-08-17 DIAGNOSIS — R296 Repeated falls: Secondary | ICD-10-CM | POA: Diagnosis not present

## 2019-08-17 DIAGNOSIS — R2681 Unsteadiness on feet: Secondary | ICD-10-CM | POA: Diagnosis not present

## 2019-08-17 DIAGNOSIS — M6281 Muscle weakness (generalized): Secondary | ICD-10-CM | POA: Diagnosis not present

## 2019-08-20 DIAGNOSIS — R2681 Unsteadiness on feet: Secondary | ICD-10-CM | POA: Diagnosis not present

## 2019-08-20 DIAGNOSIS — M5136 Other intervertebral disc degeneration, lumbar region: Secondary | ICD-10-CM | POA: Diagnosis not present

## 2019-08-20 DIAGNOSIS — R296 Repeated falls: Secondary | ICD-10-CM | POA: Diagnosis not present

## 2019-08-20 DIAGNOSIS — M6281 Muscle weakness (generalized): Secondary | ICD-10-CM | POA: Diagnosis not present

## 2019-08-20 DIAGNOSIS — R41841 Cognitive communication deficit: Secondary | ICD-10-CM | POA: Diagnosis not present

## 2019-08-21 DIAGNOSIS — R41841 Cognitive communication deficit: Secondary | ICD-10-CM | POA: Diagnosis not present

## 2019-08-21 DIAGNOSIS — R2681 Unsteadiness on feet: Secondary | ICD-10-CM | POA: Diagnosis not present

## 2019-08-21 DIAGNOSIS — M6281 Muscle weakness (generalized): Secondary | ICD-10-CM | POA: Diagnosis not present

## 2019-08-21 DIAGNOSIS — M5136 Other intervertebral disc degeneration, lumbar region: Secondary | ICD-10-CM | POA: Diagnosis not present

## 2019-08-21 DIAGNOSIS — R296 Repeated falls: Secondary | ICD-10-CM | POA: Diagnosis not present

## 2019-08-23 DIAGNOSIS — R41841 Cognitive communication deficit: Secondary | ICD-10-CM | POA: Diagnosis not present

## 2019-08-23 DIAGNOSIS — M6281 Muscle weakness (generalized): Secondary | ICD-10-CM | POA: Diagnosis not present

## 2019-08-23 DIAGNOSIS — R296 Repeated falls: Secondary | ICD-10-CM | POA: Diagnosis not present

## 2019-08-23 DIAGNOSIS — R2681 Unsteadiness on feet: Secondary | ICD-10-CM | POA: Diagnosis not present

## 2019-08-23 DIAGNOSIS — M5136 Other intervertebral disc degeneration, lumbar region: Secondary | ICD-10-CM | POA: Diagnosis not present

## 2019-08-24 DIAGNOSIS — R296 Repeated falls: Secondary | ICD-10-CM | POA: Diagnosis not present

## 2019-08-24 DIAGNOSIS — R2681 Unsteadiness on feet: Secondary | ICD-10-CM | POA: Diagnosis not present

## 2019-08-24 DIAGNOSIS — M6281 Muscle weakness (generalized): Secondary | ICD-10-CM | POA: Diagnosis not present

## 2019-08-24 DIAGNOSIS — R41841 Cognitive communication deficit: Secondary | ICD-10-CM | POA: Diagnosis not present

## 2019-08-24 DIAGNOSIS — M5136 Other intervertebral disc degeneration, lumbar region: Secondary | ICD-10-CM | POA: Diagnosis not present

## 2019-08-31 DIAGNOSIS — M5136 Other intervertebral disc degeneration, lumbar region: Secondary | ICD-10-CM | POA: Diagnosis not present

## 2019-08-31 DIAGNOSIS — M6281 Muscle weakness (generalized): Secondary | ICD-10-CM | POA: Diagnosis not present

## 2019-08-31 DIAGNOSIS — R41841 Cognitive communication deficit: Secondary | ICD-10-CM | POA: Diagnosis not present

## 2019-08-31 DIAGNOSIS — R2681 Unsteadiness on feet: Secondary | ICD-10-CM | POA: Diagnosis not present

## 2019-08-31 DIAGNOSIS — R296 Repeated falls: Secondary | ICD-10-CM | POA: Diagnosis not present

## 2019-09-03 DIAGNOSIS — R2681 Unsteadiness on feet: Secondary | ICD-10-CM | POA: Diagnosis not present

## 2019-09-03 DIAGNOSIS — M5136 Other intervertebral disc degeneration, lumbar region: Secondary | ICD-10-CM | POA: Diagnosis not present

## 2019-09-03 DIAGNOSIS — R296 Repeated falls: Secondary | ICD-10-CM | POA: Diagnosis not present

## 2019-09-03 DIAGNOSIS — M6281 Muscle weakness (generalized): Secondary | ICD-10-CM | POA: Diagnosis not present

## 2019-09-03 DIAGNOSIS — R41841 Cognitive communication deficit: Secondary | ICD-10-CM | POA: Diagnosis not present

## 2019-09-04 DIAGNOSIS — R296 Repeated falls: Secondary | ICD-10-CM | POA: Diagnosis not present

## 2019-09-04 DIAGNOSIS — R41841 Cognitive communication deficit: Secondary | ICD-10-CM | POA: Diagnosis not present

## 2019-09-04 DIAGNOSIS — R2681 Unsteadiness on feet: Secondary | ICD-10-CM | POA: Diagnosis not present

## 2019-09-04 DIAGNOSIS — M5136 Other intervertebral disc degeneration, lumbar region: Secondary | ICD-10-CM | POA: Diagnosis not present

## 2019-09-04 DIAGNOSIS — M6281 Muscle weakness (generalized): Secondary | ICD-10-CM | POA: Diagnosis not present

## 2019-09-06 DIAGNOSIS — M6281 Muscle weakness (generalized): Secondary | ICD-10-CM | POA: Diagnosis not present

## 2019-09-06 DIAGNOSIS — R41841 Cognitive communication deficit: Secondary | ICD-10-CM | POA: Diagnosis not present

## 2019-09-06 DIAGNOSIS — R296 Repeated falls: Secondary | ICD-10-CM | POA: Diagnosis not present

## 2019-09-06 DIAGNOSIS — M5136 Other intervertebral disc degeneration, lumbar region: Secondary | ICD-10-CM | POA: Diagnosis not present

## 2019-09-06 DIAGNOSIS — R2681 Unsteadiness on feet: Secondary | ICD-10-CM | POA: Diagnosis not present

## 2019-09-10 DIAGNOSIS — R296 Repeated falls: Secondary | ICD-10-CM | POA: Diagnosis not present

## 2019-09-10 DIAGNOSIS — R41841 Cognitive communication deficit: Secondary | ICD-10-CM | POA: Diagnosis not present

## 2019-09-10 DIAGNOSIS — M5136 Other intervertebral disc degeneration, lumbar region: Secondary | ICD-10-CM | POA: Diagnosis not present

## 2019-09-10 DIAGNOSIS — M6281 Muscle weakness (generalized): Secondary | ICD-10-CM | POA: Diagnosis not present

## 2019-09-10 DIAGNOSIS — R2681 Unsteadiness on feet: Secondary | ICD-10-CM | POA: Diagnosis not present

## 2019-09-11 DIAGNOSIS — R296 Repeated falls: Secondary | ICD-10-CM | POA: Diagnosis not present

## 2019-09-11 DIAGNOSIS — R41841 Cognitive communication deficit: Secondary | ICD-10-CM | POA: Diagnosis not present

## 2019-09-11 DIAGNOSIS — R2681 Unsteadiness on feet: Secondary | ICD-10-CM | POA: Diagnosis not present

## 2019-09-11 DIAGNOSIS — M6281 Muscle weakness (generalized): Secondary | ICD-10-CM | POA: Diagnosis not present

## 2019-09-11 DIAGNOSIS — M5136 Other intervertebral disc degeneration, lumbar region: Secondary | ICD-10-CM | POA: Diagnosis not present

## 2019-09-13 DIAGNOSIS — M5136 Other intervertebral disc degeneration, lumbar region: Secondary | ICD-10-CM | POA: Diagnosis not present

## 2019-09-13 DIAGNOSIS — R2681 Unsteadiness on feet: Secondary | ICD-10-CM | POA: Diagnosis not present

## 2019-09-13 DIAGNOSIS — R41841 Cognitive communication deficit: Secondary | ICD-10-CM | POA: Diagnosis not present

## 2019-09-13 DIAGNOSIS — M6281 Muscle weakness (generalized): Secondary | ICD-10-CM | POA: Diagnosis not present

## 2019-09-13 DIAGNOSIS — R296 Repeated falls: Secondary | ICD-10-CM | POA: Diagnosis not present

## 2019-09-14 DIAGNOSIS — M5136 Other intervertebral disc degeneration, lumbar region: Secondary | ICD-10-CM | POA: Diagnosis not present

## 2019-09-14 DIAGNOSIS — R2681 Unsteadiness on feet: Secondary | ICD-10-CM | POA: Diagnosis not present

## 2019-09-14 DIAGNOSIS — M6281 Muscle weakness (generalized): Secondary | ICD-10-CM | POA: Diagnosis not present

## 2019-09-14 DIAGNOSIS — R296 Repeated falls: Secondary | ICD-10-CM | POA: Diagnosis not present

## 2019-09-14 DIAGNOSIS — R41841 Cognitive communication deficit: Secondary | ICD-10-CM | POA: Diagnosis not present

## 2019-09-17 DIAGNOSIS — R2681 Unsteadiness on feet: Secondary | ICD-10-CM | POA: Diagnosis not present

## 2019-09-17 DIAGNOSIS — R296 Repeated falls: Secondary | ICD-10-CM | POA: Diagnosis not present

## 2019-09-17 DIAGNOSIS — M6281 Muscle weakness (generalized): Secondary | ICD-10-CM | POA: Diagnosis not present

## 2019-09-17 DIAGNOSIS — M5136 Other intervertebral disc degeneration, lumbar region: Secondary | ICD-10-CM | POA: Diagnosis not present

## 2019-09-18 DIAGNOSIS — R296 Repeated falls: Secondary | ICD-10-CM | POA: Diagnosis not present

## 2019-09-18 DIAGNOSIS — M5136 Other intervertebral disc degeneration, lumbar region: Secondary | ICD-10-CM | POA: Diagnosis not present

## 2019-09-18 DIAGNOSIS — M6281 Muscle weakness (generalized): Secondary | ICD-10-CM | POA: Diagnosis not present

## 2019-09-18 DIAGNOSIS — R2681 Unsteadiness on feet: Secondary | ICD-10-CM | POA: Diagnosis not present

## 2019-09-20 DIAGNOSIS — M6281 Muscle weakness (generalized): Secondary | ICD-10-CM | POA: Diagnosis not present

## 2019-09-20 DIAGNOSIS — R2681 Unsteadiness on feet: Secondary | ICD-10-CM | POA: Diagnosis not present

## 2019-09-20 DIAGNOSIS — R296 Repeated falls: Secondary | ICD-10-CM | POA: Diagnosis not present

## 2019-09-20 DIAGNOSIS — M5136 Other intervertebral disc degeneration, lumbar region: Secondary | ICD-10-CM | POA: Diagnosis not present

## 2019-09-27 DIAGNOSIS — R296 Repeated falls: Secondary | ICD-10-CM | POA: Diagnosis not present

## 2019-09-27 DIAGNOSIS — R2681 Unsteadiness on feet: Secondary | ICD-10-CM | POA: Diagnosis not present

## 2019-09-27 DIAGNOSIS — M5136 Other intervertebral disc degeneration, lumbar region: Secondary | ICD-10-CM | POA: Diagnosis not present

## 2019-09-27 DIAGNOSIS — M6281 Muscle weakness (generalized): Secondary | ICD-10-CM | POA: Diagnosis not present

## 2019-10-01 DIAGNOSIS — R296 Repeated falls: Secondary | ICD-10-CM | POA: Diagnosis not present

## 2019-10-01 DIAGNOSIS — M6281 Muscle weakness (generalized): Secondary | ICD-10-CM | POA: Diagnosis not present

## 2019-10-01 DIAGNOSIS — R2681 Unsteadiness on feet: Secondary | ICD-10-CM | POA: Diagnosis not present

## 2019-10-01 DIAGNOSIS — M5136 Other intervertebral disc degeneration, lumbar region: Secondary | ICD-10-CM | POA: Diagnosis not present

## 2019-10-03 DIAGNOSIS — M5136 Other intervertebral disc degeneration, lumbar region: Secondary | ICD-10-CM | POA: Diagnosis not present

## 2019-10-03 DIAGNOSIS — R296 Repeated falls: Secondary | ICD-10-CM | POA: Diagnosis not present

## 2019-10-03 DIAGNOSIS — R2681 Unsteadiness on feet: Secondary | ICD-10-CM | POA: Diagnosis not present

## 2019-10-03 DIAGNOSIS — M6281 Muscle weakness (generalized): Secondary | ICD-10-CM | POA: Diagnosis not present

## 2019-10-04 DIAGNOSIS — R296 Repeated falls: Secondary | ICD-10-CM | POA: Diagnosis not present

## 2019-10-04 DIAGNOSIS — R2681 Unsteadiness on feet: Secondary | ICD-10-CM | POA: Diagnosis not present

## 2019-10-04 DIAGNOSIS — M5136 Other intervertebral disc degeneration, lumbar region: Secondary | ICD-10-CM | POA: Diagnosis not present

## 2019-10-04 DIAGNOSIS — M6281 Muscle weakness (generalized): Secondary | ICD-10-CM | POA: Diagnosis not present

## 2019-10-08 DIAGNOSIS — M6281 Muscle weakness (generalized): Secondary | ICD-10-CM | POA: Diagnosis not present

## 2019-10-08 DIAGNOSIS — R296 Repeated falls: Secondary | ICD-10-CM | POA: Diagnosis not present

## 2019-10-08 DIAGNOSIS — R2681 Unsteadiness on feet: Secondary | ICD-10-CM | POA: Diagnosis not present

## 2019-10-08 DIAGNOSIS — M5136 Other intervertebral disc degeneration, lumbar region: Secondary | ICD-10-CM | POA: Diagnosis not present

## 2019-10-09 DIAGNOSIS — R296 Repeated falls: Secondary | ICD-10-CM | POA: Diagnosis not present

## 2019-10-09 DIAGNOSIS — R2681 Unsteadiness on feet: Secondary | ICD-10-CM | POA: Diagnosis not present

## 2019-10-09 DIAGNOSIS — M6281 Muscle weakness (generalized): Secondary | ICD-10-CM | POA: Diagnosis not present

## 2019-10-09 DIAGNOSIS — M5136 Other intervertebral disc degeneration, lumbar region: Secondary | ICD-10-CM | POA: Diagnosis not present

## 2019-10-12 DIAGNOSIS — R296 Repeated falls: Secondary | ICD-10-CM | POA: Diagnosis not present

## 2019-10-12 DIAGNOSIS — R2681 Unsteadiness on feet: Secondary | ICD-10-CM | POA: Diagnosis not present

## 2019-10-12 DIAGNOSIS — M5136 Other intervertebral disc degeneration, lumbar region: Secondary | ICD-10-CM | POA: Diagnosis not present

## 2019-10-12 DIAGNOSIS — M6281 Muscle weakness (generalized): Secondary | ICD-10-CM | POA: Diagnosis not present

## 2019-10-15 DIAGNOSIS — R296 Repeated falls: Secondary | ICD-10-CM | POA: Diagnosis not present

## 2019-10-15 DIAGNOSIS — M5136 Other intervertebral disc degeneration, lumbar region: Secondary | ICD-10-CM | POA: Diagnosis not present

## 2019-10-15 DIAGNOSIS — M6281 Muscle weakness (generalized): Secondary | ICD-10-CM | POA: Diagnosis not present

## 2019-10-15 DIAGNOSIS — R2681 Unsteadiness on feet: Secondary | ICD-10-CM | POA: Diagnosis not present

## 2019-10-16 DIAGNOSIS — R2681 Unsteadiness on feet: Secondary | ICD-10-CM | POA: Diagnosis not present

## 2019-10-16 DIAGNOSIS — M6281 Muscle weakness (generalized): Secondary | ICD-10-CM | POA: Diagnosis not present

## 2019-10-16 DIAGNOSIS — M5136 Other intervertebral disc degeneration, lumbar region: Secondary | ICD-10-CM | POA: Diagnosis not present

## 2019-10-16 DIAGNOSIS — R296 Repeated falls: Secondary | ICD-10-CM | POA: Diagnosis not present

## 2019-10-18 DIAGNOSIS — M5136 Other intervertebral disc degeneration, lumbar region: Secondary | ICD-10-CM | POA: Diagnosis not present

## 2019-10-18 DIAGNOSIS — R296 Repeated falls: Secondary | ICD-10-CM | POA: Diagnosis not present

## 2019-10-18 DIAGNOSIS — M6281 Muscle weakness (generalized): Secondary | ICD-10-CM | POA: Diagnosis not present

## 2019-10-18 DIAGNOSIS — R2681 Unsteadiness on feet: Secondary | ICD-10-CM | POA: Diagnosis not present

## 2019-10-19 DIAGNOSIS — H52223 Regular astigmatism, bilateral: Secondary | ICD-10-CM | POA: Diagnosis not present

## 2019-10-19 DIAGNOSIS — D3131 Benign neoplasm of right choroid: Secondary | ICD-10-CM | POA: Diagnosis not present

## 2019-10-19 DIAGNOSIS — H5211 Myopia, right eye: Secondary | ICD-10-CM | POA: Diagnosis not present

## 2019-10-19 DIAGNOSIS — H11823 Conjunctivochalasis, bilateral: Secondary | ICD-10-CM | POA: Diagnosis not present

## 2019-10-19 DIAGNOSIS — H40023 Open angle with borderline findings, high risk, bilateral: Secondary | ICD-10-CM | POA: Diagnosis not present

## 2019-10-19 DIAGNOSIS — H5202 Hypermetropia, left eye: Secondary | ICD-10-CM | POA: Diagnosis not present

## 2019-10-19 DIAGNOSIS — Z961 Presence of intraocular lens: Secondary | ICD-10-CM | POA: Diagnosis not present

## 2019-10-19 DIAGNOSIS — H43813 Vitreous degeneration, bilateral: Secondary | ICD-10-CM | POA: Diagnosis not present

## 2019-10-19 DIAGNOSIS — H524 Presbyopia: Secondary | ICD-10-CM | POA: Diagnosis not present

## 2019-10-23 DIAGNOSIS — R296 Repeated falls: Secondary | ICD-10-CM | POA: Diagnosis not present

## 2019-10-23 DIAGNOSIS — R2681 Unsteadiness on feet: Secondary | ICD-10-CM | POA: Diagnosis not present

## 2019-10-23 DIAGNOSIS — M5136 Other intervertebral disc degeneration, lumbar region: Secondary | ICD-10-CM | POA: Diagnosis not present

## 2019-10-23 DIAGNOSIS — M6281 Muscle weakness (generalized): Secondary | ICD-10-CM | POA: Diagnosis not present

## 2019-10-24 DIAGNOSIS — M5136 Other intervertebral disc degeneration, lumbar region: Secondary | ICD-10-CM | POA: Diagnosis not present

## 2019-10-24 DIAGNOSIS — R2681 Unsteadiness on feet: Secondary | ICD-10-CM | POA: Diagnosis not present

## 2019-10-24 DIAGNOSIS — M6281 Muscle weakness (generalized): Secondary | ICD-10-CM | POA: Diagnosis not present

## 2019-10-24 DIAGNOSIS — R296 Repeated falls: Secondary | ICD-10-CM | POA: Diagnosis not present

## 2019-10-26 ENCOUNTER — Telehealth: Payer: Self-pay

## 2019-10-26 DIAGNOSIS — K582 Mixed irritable bowel syndrome: Secondary | ICD-10-CM

## 2019-10-26 DIAGNOSIS — R2681 Unsteadiness on feet: Secondary | ICD-10-CM | POA: Diagnosis not present

## 2019-10-26 DIAGNOSIS — R296 Repeated falls: Secondary | ICD-10-CM | POA: Diagnosis not present

## 2019-10-26 DIAGNOSIS — M6281 Muscle weakness (generalized): Secondary | ICD-10-CM | POA: Diagnosis not present

## 2019-10-26 DIAGNOSIS — M5136 Other intervertebral disc degeneration, lumbar region: Secondary | ICD-10-CM | POA: Diagnosis not present

## 2019-10-26 NOTE — Telephone Encounter (Signed)
Received phone call from patient on nurse line requesting to speak to Dr. Andria Frames regarding medications. Patient reports that this is not an urgent request and would just like to be able to speak to provider when he is available.   Will route message to PCP  Talbot Grumbling, RN

## 2019-10-30 DIAGNOSIS — M6281 Muscle weakness (generalized): Secondary | ICD-10-CM | POA: Diagnosis not present

## 2019-10-30 DIAGNOSIS — R2681 Unsteadiness on feet: Secondary | ICD-10-CM | POA: Diagnosis not present

## 2019-10-30 DIAGNOSIS — M5136 Other intervertebral disc degeneration, lumbar region: Secondary | ICD-10-CM | POA: Diagnosis not present

## 2019-10-30 DIAGNOSIS — K589 Irritable bowel syndrome without diarrhea: Secondary | ICD-10-CM | POA: Insufficient documentation

## 2019-10-30 DIAGNOSIS — R296 Repeated falls: Secondary | ICD-10-CM | POA: Diagnosis not present

## 2019-10-30 MED ORDER — DOCUSATE SODIUM 100 MG PO CAPS: 100.0000 mg | ORAL_CAPSULE | Freq: Two times a day (BID) | ORAL | 3 refills | Status: DC

## 2019-10-30 NOTE — Telephone Encounter (Signed)
Patient has known IBS.  Was well controled on daily metamucil.  Stopped 2 years ago.  IBS has flaired again with alternating diarrhea and constipation.  perfers a pill rather than going back on metamucil.  She will also see me soon with an in person visit.

## 2019-10-30 NOTE — Assessment & Plan Note (Signed)
Restart stool softener.  Will give colace since she prefers pill.  She knows that she can switch back to metamucil.

## 2019-10-31 DIAGNOSIS — R2681 Unsteadiness on feet: Secondary | ICD-10-CM | POA: Diagnosis not present

## 2019-10-31 DIAGNOSIS — M6281 Muscle weakness (generalized): Secondary | ICD-10-CM | POA: Diagnosis not present

## 2019-10-31 DIAGNOSIS — M5136 Other intervertebral disc degeneration, lumbar region: Secondary | ICD-10-CM | POA: Diagnosis not present

## 2019-10-31 DIAGNOSIS — R296 Repeated falls: Secondary | ICD-10-CM | POA: Diagnosis not present

## 2019-11-01 DIAGNOSIS — M5136 Other intervertebral disc degeneration, lumbar region: Secondary | ICD-10-CM | POA: Diagnosis not present

## 2019-11-01 DIAGNOSIS — M6281 Muscle weakness (generalized): Secondary | ICD-10-CM | POA: Diagnosis not present

## 2019-11-01 DIAGNOSIS — R2681 Unsteadiness on feet: Secondary | ICD-10-CM | POA: Diagnosis not present

## 2019-11-01 DIAGNOSIS — R296 Repeated falls: Secondary | ICD-10-CM | POA: Diagnosis not present

## 2019-11-05 DIAGNOSIS — R2681 Unsteadiness on feet: Secondary | ICD-10-CM | POA: Diagnosis not present

## 2019-11-05 DIAGNOSIS — M5136 Other intervertebral disc degeneration, lumbar region: Secondary | ICD-10-CM | POA: Diagnosis not present

## 2019-11-05 DIAGNOSIS — R296 Repeated falls: Secondary | ICD-10-CM | POA: Diagnosis not present

## 2019-11-05 DIAGNOSIS — M6281 Muscle weakness (generalized): Secondary | ICD-10-CM | POA: Diagnosis not present

## 2019-11-08 DIAGNOSIS — M5136 Other intervertebral disc degeneration, lumbar region: Secondary | ICD-10-CM | POA: Diagnosis not present

## 2019-11-08 DIAGNOSIS — M6281 Muscle weakness (generalized): Secondary | ICD-10-CM | POA: Diagnosis not present

## 2019-11-08 DIAGNOSIS — R2681 Unsteadiness on feet: Secondary | ICD-10-CM | POA: Diagnosis not present

## 2019-11-08 DIAGNOSIS — R296 Repeated falls: Secondary | ICD-10-CM | POA: Diagnosis not present

## 2019-11-12 DIAGNOSIS — M6281 Muscle weakness (generalized): Secondary | ICD-10-CM | POA: Diagnosis not present

## 2019-11-12 DIAGNOSIS — M5136 Other intervertebral disc degeneration, lumbar region: Secondary | ICD-10-CM | POA: Diagnosis not present

## 2019-11-12 DIAGNOSIS — R2681 Unsteadiness on feet: Secondary | ICD-10-CM | POA: Diagnosis not present

## 2019-11-12 DIAGNOSIS — R296 Repeated falls: Secondary | ICD-10-CM | POA: Diagnosis not present

## 2019-11-14 DIAGNOSIS — R2681 Unsteadiness on feet: Secondary | ICD-10-CM | POA: Diagnosis not present

## 2019-11-14 DIAGNOSIS — M6281 Muscle weakness (generalized): Secondary | ICD-10-CM | POA: Diagnosis not present

## 2019-11-14 DIAGNOSIS — R296 Repeated falls: Secondary | ICD-10-CM | POA: Diagnosis not present

## 2019-11-14 DIAGNOSIS — M5136 Other intervertebral disc degeneration, lumbar region: Secondary | ICD-10-CM | POA: Diagnosis not present

## 2019-11-15 DIAGNOSIS — M6281 Muscle weakness (generalized): Secondary | ICD-10-CM | POA: Diagnosis not present

## 2019-11-15 DIAGNOSIS — M5136 Other intervertebral disc degeneration, lumbar region: Secondary | ICD-10-CM | POA: Diagnosis not present

## 2019-11-15 DIAGNOSIS — R296 Repeated falls: Secondary | ICD-10-CM | POA: Diagnosis not present

## 2019-11-15 DIAGNOSIS — R2681 Unsteadiness on feet: Secondary | ICD-10-CM | POA: Diagnosis not present

## 2019-11-16 DIAGNOSIS — R296 Repeated falls: Secondary | ICD-10-CM | POA: Diagnosis not present

## 2019-11-16 DIAGNOSIS — R2681 Unsteadiness on feet: Secondary | ICD-10-CM | POA: Diagnosis not present

## 2019-11-16 DIAGNOSIS — M6281 Muscle weakness (generalized): Secondary | ICD-10-CM | POA: Diagnosis not present

## 2019-11-16 DIAGNOSIS — M5136 Other intervertebral disc degeneration, lumbar region: Secondary | ICD-10-CM | POA: Diagnosis not present

## 2019-11-22 DIAGNOSIS — R296 Repeated falls: Secondary | ICD-10-CM | POA: Diagnosis not present

## 2019-11-22 DIAGNOSIS — R2681 Unsteadiness on feet: Secondary | ICD-10-CM | POA: Diagnosis not present

## 2019-11-22 DIAGNOSIS — M5136 Other intervertebral disc degeneration, lumbar region: Secondary | ICD-10-CM | POA: Diagnosis not present

## 2019-11-22 DIAGNOSIS — M6281 Muscle weakness (generalized): Secondary | ICD-10-CM | POA: Diagnosis not present

## 2019-11-26 ENCOUNTER — Other Ambulatory Visit: Payer: Self-pay

## 2019-11-26 ENCOUNTER — Ambulatory Visit (INDEPENDENT_AMBULATORY_CARE_PROVIDER_SITE_OTHER): Payer: Medicare Other | Admitting: Family Medicine

## 2019-11-26 ENCOUNTER — Encounter: Payer: Self-pay | Admitting: Family Medicine

## 2019-11-26 VITALS — BP 162/74 | HR 52 | Ht 65.0 in | Wt 162.2 lb

## 2019-11-26 DIAGNOSIS — E7800 Pure hypercholesterolemia, unspecified: Secondary | ICD-10-CM | POA: Diagnosis not present

## 2019-11-26 DIAGNOSIS — R296 Repeated falls: Secondary | ICD-10-CM | POA: Diagnosis not present

## 2019-11-26 DIAGNOSIS — F4322 Adjustment disorder with anxiety: Secondary | ICD-10-CM | POA: Diagnosis not present

## 2019-11-26 DIAGNOSIS — M6281 Muscle weakness (generalized): Secondary | ICD-10-CM | POA: Diagnosis not present

## 2019-11-26 DIAGNOSIS — Z23 Encounter for immunization: Secondary | ICD-10-CM

## 2019-11-26 DIAGNOSIS — I10 Essential (primary) hypertension: Secondary | ICD-10-CM | POA: Diagnosis not present

## 2019-11-26 DIAGNOSIS — M5136 Other intervertebral disc degeneration, lumbar region: Secondary | ICD-10-CM | POA: Diagnosis not present

## 2019-11-26 DIAGNOSIS — R2681 Unsteadiness on feet: Secondary | ICD-10-CM | POA: Diagnosis not present

## 2019-11-26 MED ORDER — BUSPIRONE HCL 5 MG PO TABS: 5.0000 mg | ORAL_TABLET | Freq: Three times a day (TID) | ORAL | 3 refills | Status: DC | PRN

## 2019-11-26 MED ORDER — SPIRONOLACTONE 25 MG PO TABS: 12.5000 mg | ORAL_TABLET | Freq: Every day | ORAL | 3 refills | Status: DC

## 2019-11-26 NOTE — Assessment & Plan Note (Signed)
Will check lipid panel next visit.  Primary prevention.

## 2019-11-26 NOTE — Assessment & Plan Note (Signed)
Trial of spironolactone watching for both hyponatremia. And hyperkalemia.  FU one month for bloodwork.  They do not do bloodwork at her retirement facility.

## 2019-11-26 NOTE — Assessment & Plan Note (Signed)
Add low dose prn buspar.

## 2019-11-26 NOTE — Patient Instructions (Signed)
Two new medications. Spironolactone is a mild fluid/blood pressure pill.  Only take 1/2 tab each night.  I will need to do blood work next visit. Buspirone is a mild anxiety medicine.  Take as needed.  Thank you for bringing in the BP readings.  Please do that again.   See me in two months. Call me with problems in the meantime.

## 2019-11-26 NOTE — Progress Notes (Signed)
    SUBJECTIVE:   CHIEF COMPLAINT / HPI:   Hypertension and more Hypertension.  BPs running mildly high.  Pulse has been on the low side of normal.  She also feels that she gets anxious when nurses take her BP.  Reminder Amlodipine cause leg swelling.  HCTZ caused hyponatremia.   Anxiety - difficulty in adjusting.  She is now in a retirement center with her husband in a memory care unit.  She is soldiering on the best she can.  She is sad that life has come to this.  She comes with her husband who is calm and pleasant, but quite demented.  He now confuses his wife with his dead sister.  Asks for something to use as needed for anxiety. HPDP - At age 86, she has aged out of most things.  Will get flu shot.  States she is done with mammograms.  She has had both COVID vaccines - Moderna.  Does not have her card with her.      OBJECTIVE:   BP (!) 162/74   Pulse (!) 52   Ht 5\' 5"  (1.651 m)   Wt 162 lb 3.2 oz (73.6 kg)   SpO2 99%   BMI 26.99 kg/m   Lungs clear Cardiac RRR without m or g Ext no edema. Reviewed labs from one month ago - nl BMP  ASSESSMENT/PLAN:   Hypertension Trial of spironolactone watching for both hyponatremia. And hyperkalemia.  FU one month for bloodwork.  They do not do bloodwork at her retirement facility.  Adjustment reaction Add low dose prn buspar.  Hypercholesteremia Will check lipid panel next visit.  Primary prevention.     [name], MD [place]

## 2019-11-27 DIAGNOSIS — R296 Repeated falls: Secondary | ICD-10-CM | POA: Diagnosis not present

## 2019-11-27 DIAGNOSIS — M6281 Muscle weakness (generalized): Secondary | ICD-10-CM | POA: Diagnosis not present

## 2019-11-27 DIAGNOSIS — R2681 Unsteadiness on feet: Secondary | ICD-10-CM | POA: Diagnosis not present

## 2019-11-27 DIAGNOSIS — M5136 Other intervertebral disc degeneration, lumbar region: Secondary | ICD-10-CM | POA: Diagnosis not present

## 2019-11-29 DIAGNOSIS — M6281 Muscle weakness (generalized): Secondary | ICD-10-CM | POA: Diagnosis not present

## 2019-11-29 DIAGNOSIS — R296 Repeated falls: Secondary | ICD-10-CM | POA: Diagnosis not present

## 2019-11-29 DIAGNOSIS — R2681 Unsteadiness on feet: Secondary | ICD-10-CM | POA: Diagnosis not present

## 2019-11-29 DIAGNOSIS — M5136 Other intervertebral disc degeneration, lumbar region: Secondary | ICD-10-CM | POA: Diagnosis not present

## 2020-01-07 IMAGING — DX LUMBAR SPINE - COMPLETE 4+ VIEW
5 series · 5 of 5 positions shown · non-contrast
Comparison: None.

CLINICAL DATA: Fell 6 weeks ago.  Persistent back pain.

EXAM:
LUMBAR SPINE - COMPLETE 4+ VIEW

[l-spine ap]
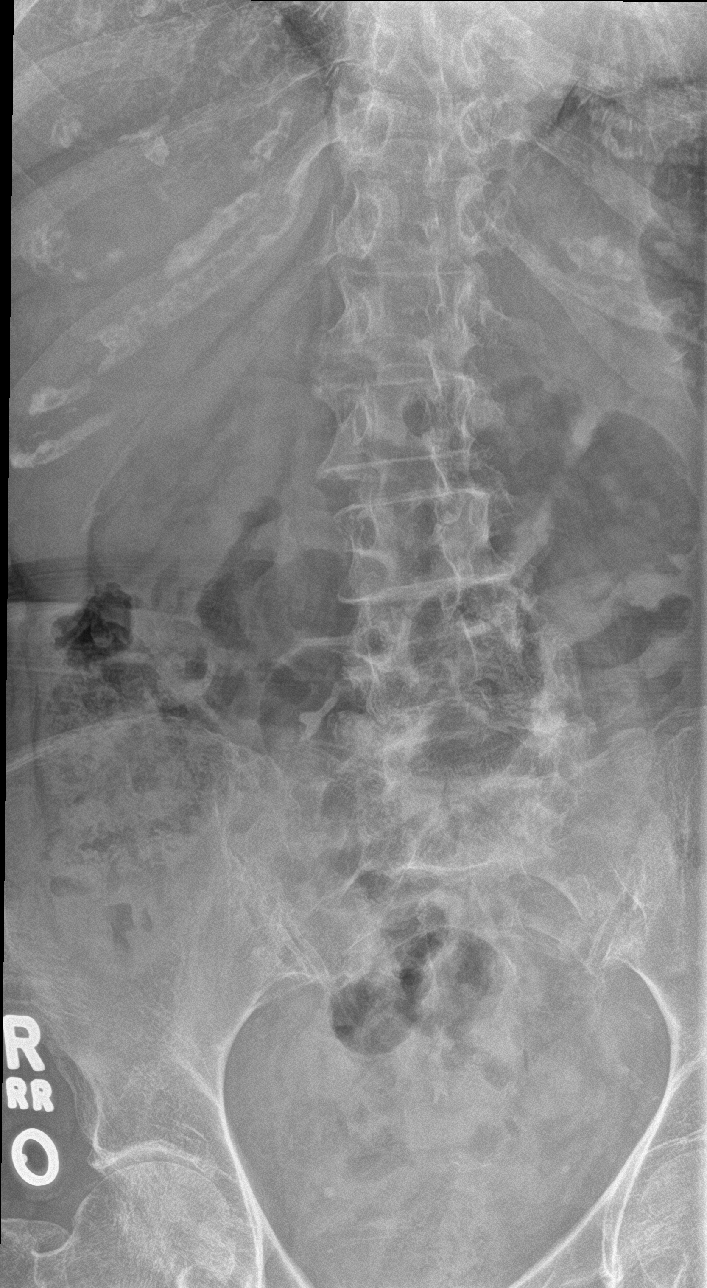

[l-spine obl (1 of 2)]
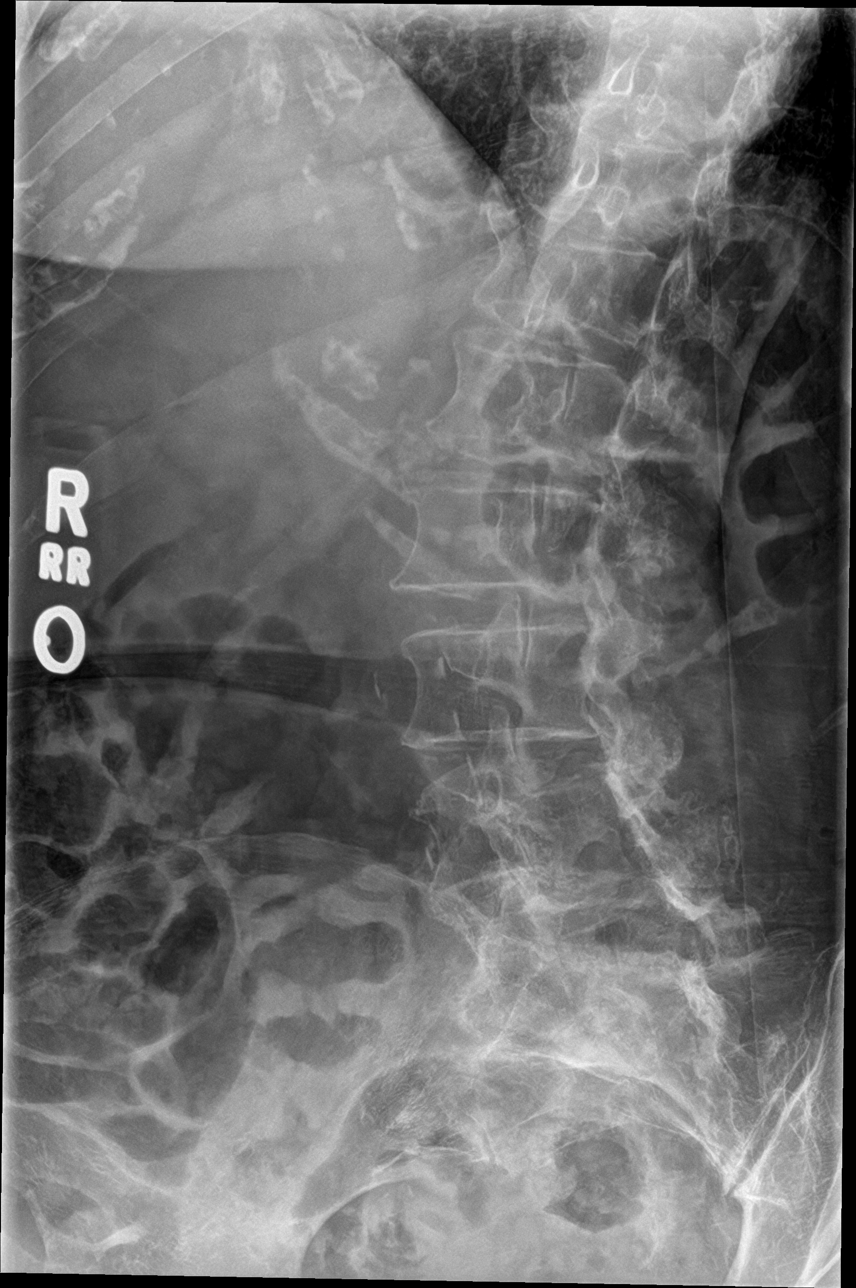

[l-spine lat]
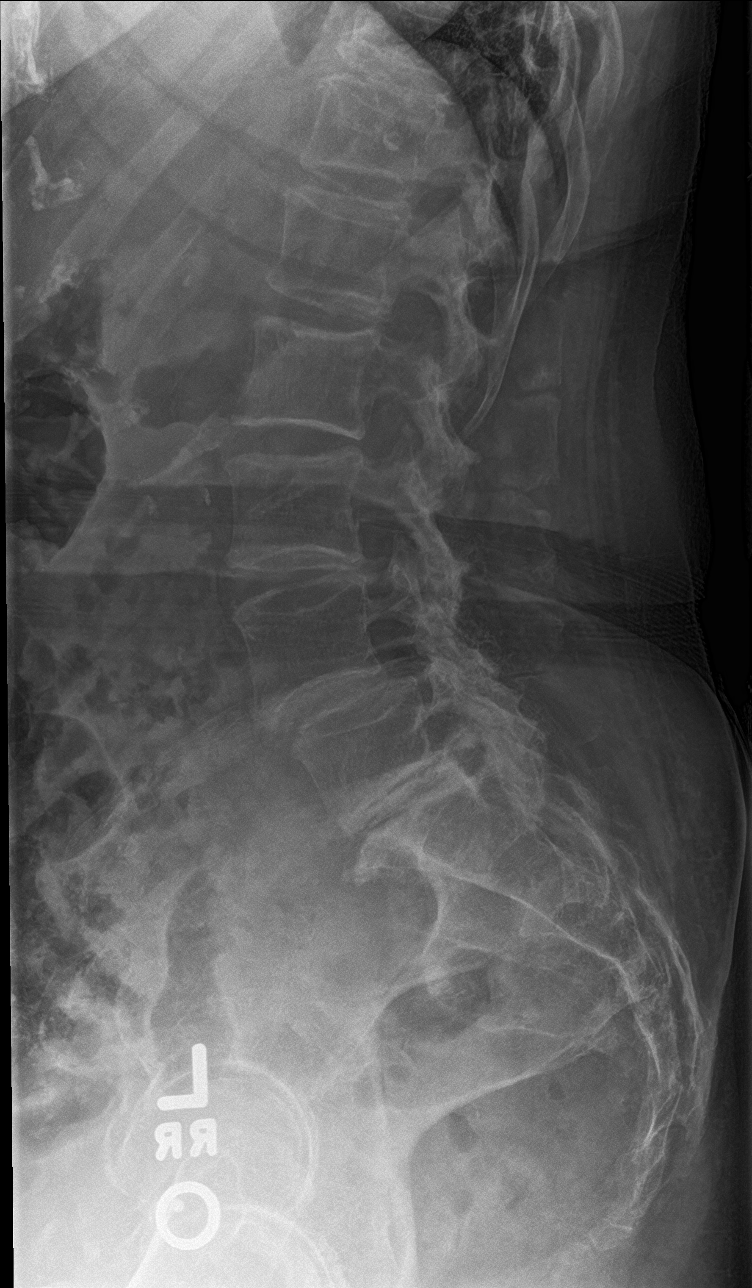

[l-spine spot]
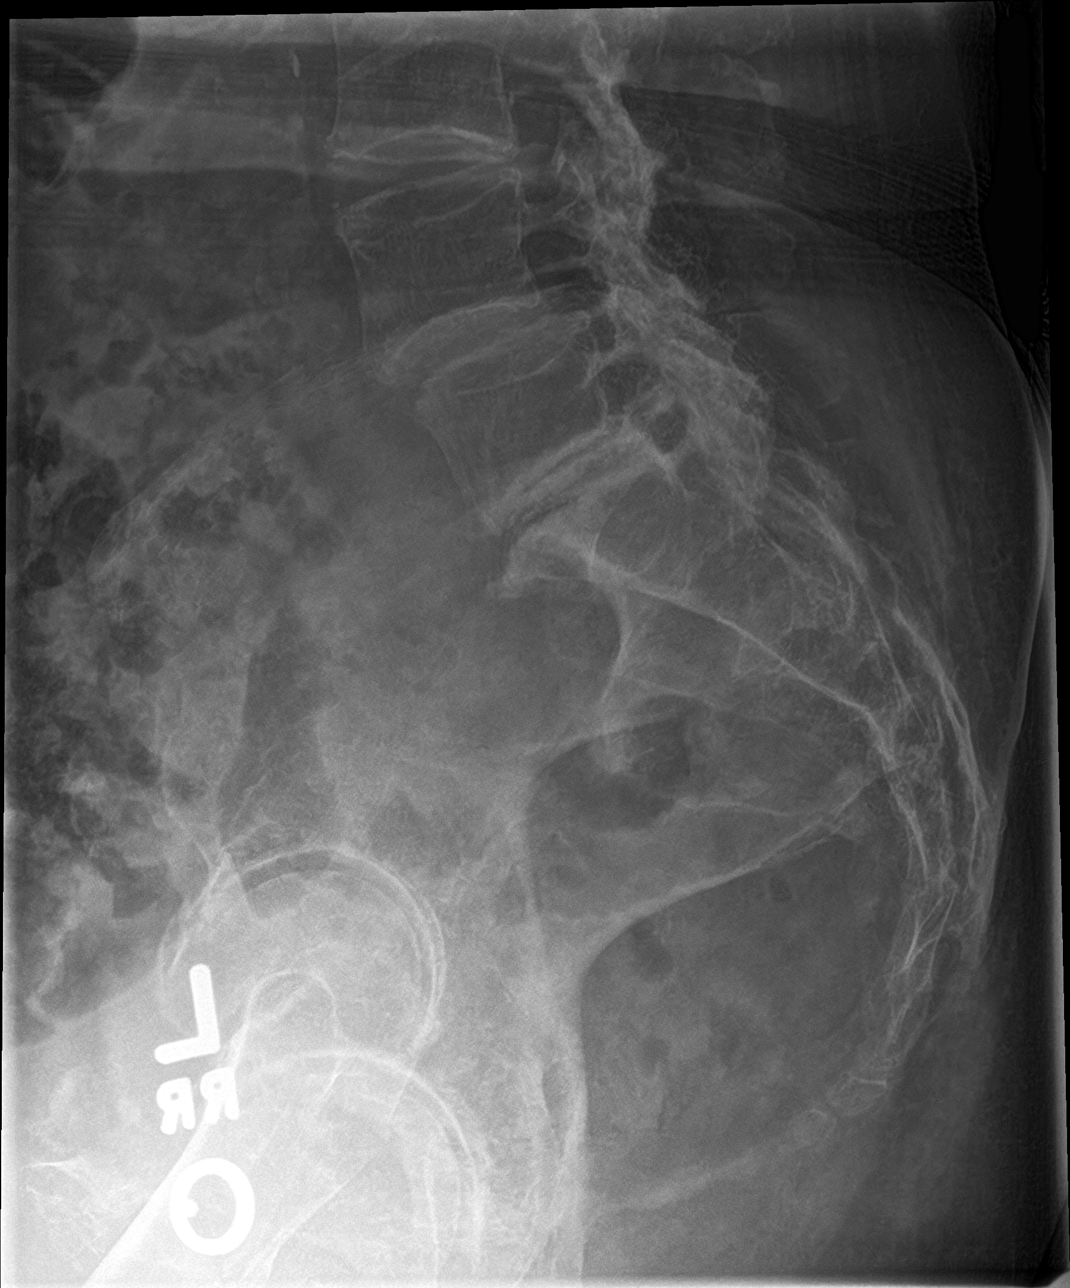

[l-spine obl (2 of 2)]
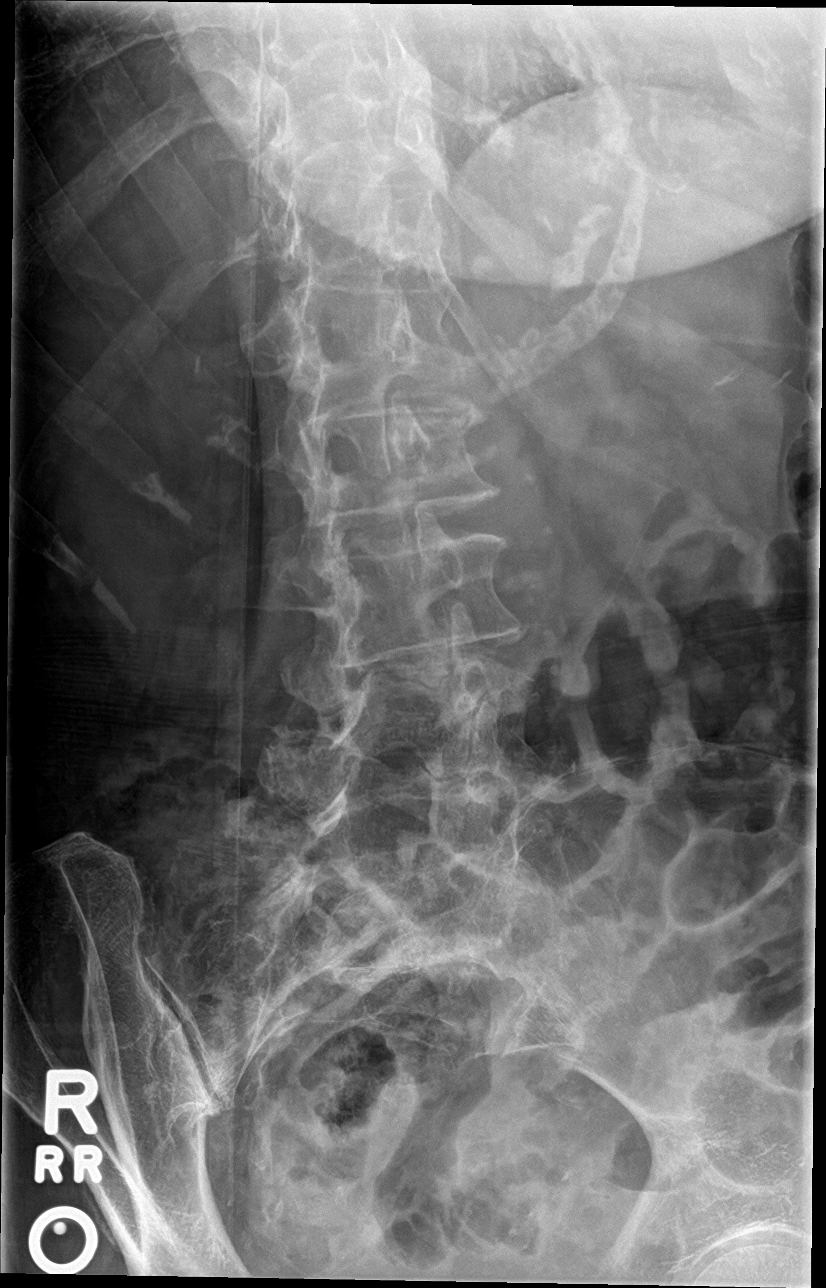

[5 of 5 positions shown; findings below may reference images not displayed]

FINDINGS: Degenerative lumbar spondylosis with multilevel disc disease and
facet disease. Degenerative anterolisthesis of L4. No acute
compression fracture or bone lesion. No definite pars defects. The
visualized bony pelvis is grossly intact.
IMPRESSION: 1. Degenerative lumbar spondylosis.
2. No acute bony findings.

## 2020-01-08 DIAGNOSIS — Z23 Encounter for immunization: Secondary | ICD-10-CM | POA: Diagnosis not present

## 2020-01-24 ENCOUNTER — Encounter: Payer: Self-pay | Admitting: Family Medicine

## 2020-01-24 ENCOUNTER — Other Ambulatory Visit: Payer: Self-pay

## 2020-01-24 ENCOUNTER — Ambulatory Visit (INDEPENDENT_AMBULATORY_CARE_PROVIDER_SITE_OTHER): Payer: Medicare Other | Admitting: Family Medicine

## 2020-01-24 DIAGNOSIS — M545 Low back pain, unspecified: Secondary | ICD-10-CM

## 2020-01-24 DIAGNOSIS — E7800 Pure hypercholesterolemia, unspecified: Secondary | ICD-10-CM

## 2020-01-24 DIAGNOSIS — M79605 Pain in left leg: Secondary | ICD-10-CM

## 2020-01-24 DIAGNOSIS — G2581 Restless legs syndrome: Secondary | ICD-10-CM | POA: Diagnosis not present

## 2020-01-24 DIAGNOSIS — I10 Essential (primary) hypertension: Secondary | ICD-10-CM

## 2020-01-24 DIAGNOSIS — G4700 Insomnia, unspecified: Secondary | ICD-10-CM | POA: Insufficient documentation

## 2020-01-24 MED ORDER — GABAPENTIN 100 MG PO CAPS: 200.0000 mg | ORAL_CAPSULE | Freq: Every day | ORAL | 12 refills | Status: DC

## 2020-01-24 NOTE — Assessment & Plan Note (Signed)
Good control on current regimen and tolerating well.  Check BMP.

## 2020-01-24 NOTE — Assessment & Plan Note (Signed)
Increase gabapentin dose was her first option.  Try 200 mg qhs.  Warned about fall precautions at night when up to urinate.

## 2020-01-24 NOTE — Progress Notes (Signed)
    SUBJECTIVE:   CHIEF COMPLAINT / HPI:   FU hypertension and more. Hypertension: We have jointly decided on a goal of below 017 systolic.  If I push lower, she has felt washed out and has become hyponatremic.  Brings in a long list of home BP readings, almost all of which are at goal.  She feels fine on her current treatment regimen.  Denies CP or SOB.  Sleeping poorly.  Does have worsening restless leg.  Does not endorse depression.  Life is stressful.  She is in a facility but not in the same room with her husband, who has dementia, which is getting worse.  She is pleased to him but also stressed by his confusion and him asking her to take him home.  Has had COVID vaccines.  She will bring in records.    OBJECTIVE:   BP (!) 164/82   Pulse 68   Ht 5\' 5"  (1.651 m)   Wt 162 lb 9.6 oz (73.8 kg)   SpO2 98%   BMI 27.06 kg/m   Lungs clear Cardiac RRR without m or g   ASSESSMENT/PLAN:   Hypertension Good control on current regimen and tolerating well.  Check BMP.    Insomnia Treat by upping dose of gabapentin for restless leg.  Restless leg syndrome Increase gabapentin dose was her first option.  Try 200 mg qhs.  Warned about fall precautions at night when up to urinate.     [name], MD [place]

## 2020-01-24 NOTE — Assessment & Plan Note (Signed)
Treat by upping dose of gabapentin for restless leg.

## 2020-01-24 NOTE — Patient Instructions (Signed)
I will take the advice of my consultant, Dr. Thornell Sartorius, and be happy with the blood pressures as they are I will call with the lab test results. Please go back to taking your cholesterol medicine at night Please try two gabapentin at night before bed. Call me in January and let me know if that has helped the sleeping problem.

## 2020-01-25 LAB — CMP14+EGFR
ALT: 10 IU/L (ref 0–32)
AST: 11 IU/L (ref 0–40)
Albumin/Globulin Ratio: 2 (ref 1.2–2.2)
Albumin: 4.5 g/dL (ref 3.6–4.6)
Alkaline Phosphatase: 109 IU/L (ref 44–121)
BUN/Creatinine Ratio: 23 (ref 12–28)
BUN: 23 mg/dL (ref 8–27)
Bilirubin Total: 0.4 mg/dL (ref 0.0–1.2)
CO2: 21 mmol/L (ref 20–29)
Calcium: 9.6 mg/dL (ref 8.7–10.3)
Chloride: 99 mmol/L (ref 96–106)
Creatinine, Ser: 0.99 mg/dL (ref 0.57–1.00)
GFR calc Af Amer: 60 mL/min/{1.73_m2} (ref >59)
GFR calc non Af Amer: 52 mL/min/{1.73_m2} — ABNORMAL LOW (ref >59)
Globulin, Total: 2.3 g/dL (ref 1.5–4.5)
Glucose: 94 mg/dL (ref 65–99)
Potassium: 5.2 mmol/L (ref 3.5–5.2)
Sodium: 136 mmol/L (ref 134–144)
Total Protein: 6.8 g/dL (ref 6.0–8.5)

## 2020-01-25 LAB — LIPID PANEL
Chol/HDL Ratio: 3.3 ratio (ref 0.0–4.4)
Cholesterol, Total: 213 mg/dL — ABNORMAL HIGH (ref 100–199)
HDL: 64 mg/dL (ref >39)
LDL Chol Calc (NIH): 120 mg/dL — ABNORMAL HIGH (ref 0–99)
Triglycerides: 164 mg/dL — ABNORMAL HIGH (ref 0–149)
VLDL Cholesterol Cal: 29 mg/dL (ref 5–40)

## 2020-01-26 ENCOUNTER — Other Ambulatory Visit: Payer: Self-pay | Admitting: Family Medicine

## 2020-01-26 DIAGNOSIS — I10 Essential (primary) hypertension: Secondary | ICD-10-CM

## 2020-02-25 ENCOUNTER — Telehealth: Payer: Self-pay

## 2020-02-25 NOTE — Telephone Encounter (Signed)
Called, no answer.  Left message that I was glad that she is doing well.  Asked her to call back if she needs to speak with me directly.

## 2020-02-25 NOTE — Telephone Encounter (Signed)
Patient Gina Bishop on nurse line wanting to update PCP. Patient reports she is sleeping much better and overall doing much better. Will forward to PCP.

## 2020-03-07 ENCOUNTER — Other Ambulatory Visit: Payer: Self-pay | Admitting: Family Medicine

## 2020-03-07 DIAGNOSIS — M545 Low back pain, unspecified: Secondary | ICD-10-CM

## 2020-05-19 ENCOUNTER — Telehealth: Payer: Self-pay

## 2020-05-19 NOTE — Telephone Encounter (Signed)
Patient calls nurse line requesting an updated handicap placard. I have placed handicap placard in PCP box for review. Son will come and pick up once completed. The patient is aware she needs to fill out her portion before turning in to [facility].

## 2020-05-27 NOTE — Telephone Encounter (Signed)
New form placed in PCP box for review.

## 2020-05-28 NOTE — Telephone Encounter (Signed)
Called and let her know the form is complete.  I will leave up front and son, Randall Hiss, will come by and pick it up.

## 2020-06-03 DIAGNOSIS — Z91018 Allergy to other foods: Secondary | ICD-10-CM | POA: Diagnosis not present

## 2020-06-03 DIAGNOSIS — Z91013 Allergy to seafood: Secondary | ICD-10-CM | POA: Diagnosis not present

## 2020-06-03 DIAGNOSIS — Z880 Allergy status to penicillin: Secondary | ICD-10-CM | POA: Diagnosis not present

## 2020-06-03 DIAGNOSIS — R350 Frequency of micturition: Secondary | ICD-10-CM | POA: Diagnosis not present

## 2020-06-03 DIAGNOSIS — Z79899 Other long term (current) drug therapy: Secondary | ICD-10-CM | POA: Diagnosis not present

## 2020-06-03 DIAGNOSIS — I10 Essential (primary) hypertension: Secondary | ICD-10-CM | POA: Diagnosis not present

## 2020-06-03 DIAGNOSIS — E7800 Pure hypercholesterolemia, unspecified: Secondary | ICD-10-CM | POA: Diagnosis not present

## 2020-06-03 DIAGNOSIS — Z886 Allergy status to analgesic agent status: Secondary | ICD-10-CM | POA: Diagnosis not present

## 2020-06-03 DIAGNOSIS — Z888 Allergy status to other drugs, medicaments and biological substances status: Secondary | ICD-10-CM | POA: Diagnosis not present

## 2020-06-04 ENCOUNTER — Telehealth: Payer: Self-pay

## 2020-06-04 NOTE — Telephone Encounter (Signed)
Patient LVM on nurse line requesting to schedule follow up for BP after recent ED visit (4/19). Attempted to call patient to schedule appointment, no answer. Left HIPAA compliant VM for patient to return call to office to schedule appointment.   Talbot Grumbling, RN

## 2020-06-09 ENCOUNTER — Telehealth: Payer: Self-pay

## 2020-06-09 DIAGNOSIS — J301 Allergic rhinitis due to pollen: Secondary | ICD-10-CM

## 2020-06-09 MED ORDER — FEXOFENADINE HCL 180 MG PO TABS: 180.0000 mg | ORAL_TABLET | Freq: Every day | ORAL | 99 refills | Status: DC

## 2020-06-09 NOTE — Telephone Encounter (Signed)
Rx Allegra 180 mg tablet, one tab daily prn, seasonal allergic rhinitis

## 2020-06-09 NOTE — Telephone Encounter (Signed)
[facility] calls nurse line requesting an allergy medication sent to patients [facility]. [facility] reports she has been spending a lot of time outside walking with the nicer weather and has developed watery eyes and sneezing. No fever, cough, congestion, or SOB. They covid tested her and negative. Please send to [facility] in [place].

## 2020-06-10 DIAGNOSIS — R030 Elevated blood-pressure reading, without diagnosis of hypertension: Secondary | ICD-10-CM | POA: Diagnosis not present

## 2020-06-10 DIAGNOSIS — J302 Other seasonal allergic rhinitis: Secondary | ICD-10-CM | POA: Diagnosis not present

## 2020-06-14 ENCOUNTER — Other Ambulatory Visit: Payer: Self-pay | Admitting: Family Medicine

## 2020-06-14 DIAGNOSIS — I10 Essential (primary) hypertension: Secondary | ICD-10-CM

## 2020-06-16 ENCOUNTER — Other Ambulatory Visit: Payer: Self-pay | Admitting: Family Medicine

## 2020-06-16 DIAGNOSIS — I10 Essential (primary) hypertension: Secondary | ICD-10-CM

## 2020-07-02 ENCOUNTER — Encounter: Payer: Self-pay | Admitting: Family Medicine

## 2020-07-02 ENCOUNTER — Other Ambulatory Visit: Payer: Self-pay

## 2020-07-02 ENCOUNTER — Ambulatory Visit (INDEPENDENT_AMBULATORY_CARE_PROVIDER_SITE_OTHER): Payer: Medicare Other | Admitting: Family Medicine

## 2020-07-02 DIAGNOSIS — R011 Cardiac murmur, unspecified: Secondary | ICD-10-CM | POA: Diagnosis not present

## 2020-07-02 DIAGNOSIS — I499 Cardiac arrhythmia, unspecified: Secondary | ICD-10-CM | POA: Diagnosis not present

## 2020-07-02 DIAGNOSIS — E7800 Pure hypercholesterolemia, unspecified: Secondary | ICD-10-CM

## 2020-07-02 DIAGNOSIS — I10 Essential (primary) hypertension: Secondary | ICD-10-CM | POA: Diagnosis not present

## 2020-07-02 DIAGNOSIS — F4322 Adjustment disorder with anxiety: Secondary | ICD-10-CM | POA: Diagnosis not present

## 2020-07-02 NOTE — Progress Notes (Signed)
    SUBJECTIVE:   CHIEF COMPLAINT / HPI:   FU chronic problems 1. HBP.  Mainly systolic hypertension with wide pulse pressure.  Has had lightheadedness if we push too hard.  Feels fine now.  Brings in multiple readings.  Alls diastolics OK (17< [id number]<18)  Does have some isolated systolics over 563.  No chest pain or DOE 2. Allergies.  Could not tolerate fexofenidine.  Added to allergy list.  Doing well with astelin, added to med list. 3. Cardiac arythmia on problem list.  PACs and PVCs.  No documented A fib.  No syncope or palpitations.  Denies chest pain or SOB 4. Due for fourth COVID.  Wants to put off.  Has 39 aniversary with husband tomorrow. 5. Adjustment reaction: Adjusting well and upbeat even though husband has progressive dementia (dxed 2017.)  He still recognizes her.  He is in memory care while she is in assisted living.      OBJECTIVE:   BP (!) 162/64   Pulse 64   Ht 5\' 5"  (1.651 m)   Wt 166 lb 6.4 oz (75.5 kg)   SpO2 98%   BMI 27.69 kg/m   VS noted including wide pulse pressure Lungs clear Cardiac Some irregularity. with 2/6 SEM/ Ext trace edema  ASSESSMENT/PLAN:   Hypertension Goal is systolic below 149.  Bulk of readings from assisted living achieve that goal.    Heart murmur, systolic Patient has harsh m that sounds aortic.  I suggested an echo to differentiate between aortic stenosis and sclerosis.  She declined because she is asymptomatic.    Cardiac arrhythmia Previous extensive WU for a fib and not found.  I did not do EKG now.  Adjustment reaction Stable to improved.  She and husband have settled into the facility.  She is looking forward to their anniversary.  Husband still recognizes her.  And life is hard.  Hypercholesteremia We discussed possibility of increasing statin.  She opted to stay at same dose.  She is an "If it ain't broke, don't fix it" kind of person.     [name], MD [place]

## 2020-07-02 NOTE — Patient Instructions (Addendum)
Happy Anniversary  I hope Gina Bishop has a good day.   You have some things that I could tweak or investigate further. 1. You blood pressure have a few high readings 2. I could switch you to a more potent cholesterol medication 3. I could order an echocardiogram to investigate the heart murmur. For sure let me know if you have chest pain or shortness of breath.   Thank you for risking your life on [address] to see me.   You are due for your 2nd COVID booster (4th shot).  I recommend you get it when you could be sick for a few days.

## 2020-07-04 DIAGNOSIS — R011 Cardiac murmur, unspecified: Secondary | ICD-10-CM | POA: Insufficient documentation

## 2020-07-04 DIAGNOSIS — I358 Other nonrheumatic aortic valve disorders: Secondary | ICD-10-CM | POA: Insufficient documentation

## 2020-07-04 NOTE — Assessment & Plan Note (Signed)
Previous extensive WU for a fib and not found.  I did not do EKG now.

## 2020-07-04 NOTE — Assessment & Plan Note (Signed)
Goal is systolic below 757.  Bulk of readings from assisted living achieve that goal.

## 2020-07-04 NOTE — Assessment & Plan Note (Signed)
Patient has harsh m that sounds aortic.  I suggested an echo to differentiate between aortic stenosis and sclerosis.  She declined because she is asymptomatic.

## 2020-07-04 NOTE — Assessment & Plan Note (Signed)
Stable to improved.  She and husband have settled into the facility.  She is looking forward to their anniversary.  Husband still recognizes her.  And life is hard.

## 2020-07-04 NOTE — Assessment & Plan Note (Signed)
We discussed possibility of increasing statin.  She opted to stay at same dose.  She is an "If it ain't broke, don't fix it" kind of person.

## 2020-07-08 ENCOUNTER — Telehealth: Payer: Self-pay

## 2020-07-08 DIAGNOSIS — R011 Cardiac murmur, unspecified: Secondary | ICD-10-CM

## 2020-07-08 NOTE — Telephone Encounter (Signed)
Patient calls nurse line requesting to proceed with echocardiogram. Patient requests that appointment be scheduled in June, as she is going out of town for [employer].   Patient is also requesting to schedule appointment for COVID booster. Scheduled for 6/6 at 1000.  Please advise if echo order can be placed.   Talbot Grumbling, RN

## 2020-07-08 NOTE — Telephone Encounter (Signed)
Echo ordered.  I put in for it to be done at [facility].  Patient lives in [place] so it will be less far for her to drive.

## 2020-07-21 ENCOUNTER — Ambulatory Visit: Payer: Medicare Other

## 2020-07-22 ENCOUNTER — Ambulatory Visit: Payer: Medicare Other

## 2020-08-08 ENCOUNTER — Other Ambulatory Visit (HOSPITAL_BASED_OUTPATIENT_CLINIC_OR_DEPARTMENT_OTHER): Payer: Medicare Other

## 2020-08-13 ENCOUNTER — Ambulatory Visit (HOSPITAL_BASED_OUTPATIENT_CLINIC_OR_DEPARTMENT_OTHER)
Admission: RE | Admit: 2020-08-13 | Discharge: 2020-08-13 | Disposition: A | Discharge status: Home / Self Care | Payer: Medicare Other | Source: Ambulatory Visit | Attending: Family Medicine | Admitting: Family Medicine

## 2020-08-13 ENCOUNTER — Other Ambulatory Visit: Payer: Self-pay

## 2020-08-13 DIAGNOSIS — R011 Cardiac murmur, unspecified: Secondary | ICD-10-CM | POA: Insufficient documentation

## 2020-08-13 LAB — ECHOCARDIOGRAM COMPLETE
AR max vel: 3.25 cm2
AV Area VTI: 3.08 cm2
AV Area mean vel: 2.92 cm2
AV Mean grad: 8 mmHg
AV Peak grad: 13 mmHg
AV Vena cont: 0.27 cm
Ao pk vel: 1.8 m/s
Area-P 1/2: 2.24 cm2
Calc EF: 46.3 %
MV M vel: 3.74 m/s
MV Peak grad: 56 mmHg
S' Lateral: 4.72 cm
Single Plane A2C EF: 51.7 %
Single Plane A4C EF: 36.9 %

## 2020-08-13 NOTE — Progress Notes (Signed)
*  PRELIMINARY RESULTS* Echocardiogram 2D Echocardiogram has been performed.  Luisa Hart RDCS 08/13/2020, 2:46 PM

## 2020-08-19 ENCOUNTER — Other Ambulatory Visit: Payer: Self-pay

## 2020-08-19 ENCOUNTER — Ambulatory Visit (INDEPENDENT_AMBULATORY_CARE_PROVIDER_SITE_OTHER): Payer: Medicare Other

## 2020-08-19 DIAGNOSIS — Z23 Encounter for immunization: Secondary | ICD-10-CM

## 2020-08-20 DIAGNOSIS — Z20822 Contact with and (suspected) exposure to covid-19: Secondary | ICD-10-CM | POA: Diagnosis not present

## 2020-10-22 DIAGNOSIS — M79674 Pain in right toe(s): Secondary | ICD-10-CM | POA: Diagnosis not present

## 2020-10-22 DIAGNOSIS — M79675 Pain in left toe(s): Secondary | ICD-10-CM | POA: Diagnosis not present

## 2020-10-22 DIAGNOSIS — B351 Tinea unguium: Secondary | ICD-10-CM | POA: Diagnosis not present

## 2020-11-08 ENCOUNTER — Other Ambulatory Visit: Payer: Self-pay | Admitting: Family Medicine

## 2020-11-08 DIAGNOSIS — I10 Essential (primary) hypertension: Secondary | ICD-10-CM

## 2020-12-10 DIAGNOSIS — Z23 Encounter for immunization: Secondary | ICD-10-CM | POA: Diagnosis not present

## 2021-01-01 DIAGNOSIS — Z20828 Contact with and (suspected) exposure to other viral communicable diseases: Secondary | ICD-10-CM | POA: Diagnosis not present

## 2021-01-05 DIAGNOSIS — I10 Essential (primary) hypertension: Secondary | ICD-10-CM | POA: Diagnosis not present

## 2021-01-05 DIAGNOSIS — M6281 Muscle weakness (generalized): Secondary | ICD-10-CM | POA: Diagnosis not present

## 2021-01-15 DIAGNOSIS — I10 Essential (primary) hypertension: Secondary | ICD-10-CM | POA: Diagnosis not present

## 2021-01-15 DIAGNOSIS — M6281 Muscle weakness (generalized): Secondary | ICD-10-CM | POA: Diagnosis not present

## 2021-01-16 DIAGNOSIS — I10 Essential (primary) hypertension: Secondary | ICD-10-CM | POA: Diagnosis not present

## 2021-01-16 DIAGNOSIS — M6281 Muscle weakness (generalized): Secondary | ICD-10-CM | POA: Diagnosis not present

## 2021-01-20 DIAGNOSIS — I10 Essential (primary) hypertension: Secondary | ICD-10-CM | POA: Diagnosis not present

## 2021-01-20 DIAGNOSIS — M6281 Muscle weakness (generalized): Secondary | ICD-10-CM | POA: Diagnosis not present

## 2021-01-21 DIAGNOSIS — M6281 Muscle weakness (generalized): Secondary | ICD-10-CM | POA: Diagnosis not present

## 2021-01-21 DIAGNOSIS — I10 Essential (primary) hypertension: Secondary | ICD-10-CM | POA: Diagnosis not present

## 2021-01-22 DIAGNOSIS — I10 Essential (primary) hypertension: Secondary | ICD-10-CM | POA: Diagnosis not present

## 2021-01-22 DIAGNOSIS — M6281 Muscle weakness (generalized): Secondary | ICD-10-CM | POA: Diagnosis not present

## 2021-01-23 DIAGNOSIS — H40023 Open angle with borderline findings, high risk, bilateral: Secondary | ICD-10-CM | POA: Diagnosis not present

## 2021-01-23 DIAGNOSIS — H43813 Vitreous degeneration, bilateral: Secondary | ICD-10-CM | POA: Diagnosis not present

## 2021-01-23 DIAGNOSIS — Z961 Presence of intraocular lens: Secondary | ICD-10-CM | POA: Diagnosis not present

## 2021-01-23 DIAGNOSIS — D3131 Benign neoplasm of right choroid: Secondary | ICD-10-CM | POA: Diagnosis not present

## 2021-01-26 DIAGNOSIS — M6281 Muscle weakness (generalized): Secondary | ICD-10-CM | POA: Diagnosis not present

## 2021-01-26 DIAGNOSIS — I10 Essential (primary) hypertension: Secondary | ICD-10-CM | POA: Diagnosis not present

## 2021-01-27 DIAGNOSIS — I10 Essential (primary) hypertension: Secondary | ICD-10-CM | POA: Diagnosis not present

## 2021-01-27 DIAGNOSIS — M6281 Muscle weakness (generalized): Secondary | ICD-10-CM | POA: Diagnosis not present

## 2021-01-28 DIAGNOSIS — I10 Essential (primary) hypertension: Secondary | ICD-10-CM | POA: Diagnosis not present

## 2021-01-28 DIAGNOSIS — M6281 Muscle weakness (generalized): Secondary | ICD-10-CM | POA: Diagnosis not present

## 2021-01-30 DIAGNOSIS — I10 Essential (primary) hypertension: Secondary | ICD-10-CM | POA: Diagnosis not present

## 2021-01-30 DIAGNOSIS — M6281 Muscle weakness (generalized): Secondary | ICD-10-CM | POA: Diagnosis not present

## 2021-02-02 ENCOUNTER — Other Ambulatory Visit: Payer: Self-pay | Admitting: Family Medicine

## 2021-02-02 DIAGNOSIS — M6281 Muscle weakness (generalized): Secondary | ICD-10-CM | POA: Diagnosis not present

## 2021-02-02 DIAGNOSIS — I10 Essential (primary) hypertension: Secondary | ICD-10-CM

## 2021-02-03 DIAGNOSIS — R350 Frequency of micturition: Secondary | ICD-10-CM | POA: Diagnosis not present

## 2021-02-03 DIAGNOSIS — R531 Weakness: Secondary | ICD-10-CM | POA: Diagnosis not present

## 2021-02-03 DIAGNOSIS — R059 Cough, unspecified: Secondary | ICD-10-CM | POA: Diagnosis not present

## 2021-02-03 DIAGNOSIS — R001 Bradycardia, unspecified: Secondary | ICD-10-CM | POA: Diagnosis not present

## 2021-02-03 DIAGNOSIS — I10 Essential (primary) hypertension: Secondary | ICD-10-CM | POA: Diagnosis not present

## 2021-02-04 ENCOUNTER — Telehealth: Payer: Self-pay

## 2021-02-04 DIAGNOSIS — M6281 Muscle weakness (generalized): Secondary | ICD-10-CM | POA: Diagnosis not present

## 2021-02-04 DIAGNOSIS — I10 Essential (primary) hypertension: Secondary | ICD-10-CM | POA: Diagnosis not present

## 2021-02-04 NOTE — Telephone Encounter (Signed)
Patient calls nurse line requesting to speak with Dr. Andria Frames. Patient reports she went to UC yesterday for insomnia and "shakiness." Patient reports her BP was 185/78 and they advised her to go to the ED, however patient declined. Patient reports she feels better today and declines any present symptoms aside from fatigue. Patient denies chest pain, vision changes, SOB or headaches. Patients main concern is UC took her off Diltiazem. Patient reports they told her her heart rate was too low, however when asked she stated, "I cant remember what it was."   Patient scheduled for FU with PCP on 1/4. Patient requests a call from PCP to discuss continuing vs discontinuing Diltiazem.  Precautions given to patient.

## 2021-02-04 NOTE — Telephone Encounter (Signed)
Spoke with patient and was able to review urgent care visit at [facility] in [place]. Her symptoms: Covid in Sept 2022.  Lingering cough.  Recently worse with presumed second infection.  No pneumonia on exam.  Feels slightly better today. Bradycardia.  HR documented to be 46, which is too low.  Recommended stop diltiazem - I told patient I agree that she should stop. Systolic HBP with low diastolics.  Concern will worsen off diltiazem.  She will check multiple home BP readings and bring those readings in when she sees me on 02/18/21

## 2021-02-10 DIAGNOSIS — I10 Essential (primary) hypertension: Secondary | ICD-10-CM | POA: Diagnosis not present

## 2021-02-10 DIAGNOSIS — M6281 Muscle weakness (generalized): Secondary | ICD-10-CM | POA: Diagnosis not present

## 2021-02-11 DIAGNOSIS — M6281 Muscle weakness (generalized): Secondary | ICD-10-CM | POA: Diagnosis not present

## 2021-02-11 DIAGNOSIS — I10 Essential (primary) hypertension: Secondary | ICD-10-CM | POA: Diagnosis not present

## 2021-02-17 DIAGNOSIS — M6281 Muscle weakness (generalized): Secondary | ICD-10-CM | POA: Diagnosis not present

## 2021-02-17 DIAGNOSIS — I10 Essential (primary) hypertension: Secondary | ICD-10-CM | POA: Diagnosis not present

## 2021-02-18 ENCOUNTER — Other Ambulatory Visit: Payer: Self-pay

## 2021-02-18 ENCOUNTER — Encounter: Payer: Self-pay | Admitting: Family Medicine

## 2021-02-18 ENCOUNTER — Ambulatory Visit (INDEPENDENT_AMBULATORY_CARE_PROVIDER_SITE_OTHER): Payer: Medicare Other | Admitting: Family Medicine

## 2021-02-18 DIAGNOSIS — G4700 Insomnia, unspecified: Secondary | ICD-10-CM | POA: Diagnosis not present

## 2021-02-18 DIAGNOSIS — I10 Essential (primary) hypertension: Secondary | ICD-10-CM

## 2021-02-18 DIAGNOSIS — I358 Other nonrheumatic aortic valve disorders: Secondary | ICD-10-CM

## 2021-02-18 DIAGNOSIS — L989 Disorder of the skin and subcutaneous tissue, unspecified: Secondary | ICD-10-CM | POA: Diagnosis not present

## 2021-02-18 NOTE — Patient Instructions (Signed)
I am pleased with how you are doing.   The blood pressure will be a tightrope to walk.  No changes today. The leg nodule is not a cyst.  I don't believe it needs to drain. See me in six months if things are going well.

## 2021-02-19 DIAGNOSIS — M6281 Muscle weakness (generalized): Secondary | ICD-10-CM | POA: Diagnosis not present

## 2021-02-19 DIAGNOSIS — I10 Essential (primary) hypertension: Secondary | ICD-10-CM | POA: Diagnosis not present

## 2021-02-20 ENCOUNTER — Encounter: Payer: Self-pay | Admitting: Family Medicine

## 2021-02-20 DIAGNOSIS — L989 Disorder of the skin and subcutaneous tissue, unspecified: Secondary | ICD-10-CM | POA: Insufficient documentation

## 2021-02-20 NOTE — Assessment & Plan Note (Signed)
Benign appearing nodule.  No further intervention unless grows.

## 2021-02-20 NOTE — Assessment & Plan Note (Signed)
Reviewed recent echo because of m.  Aortic sclerosis without stenosis.

## 2021-02-20 NOTE — Assessment & Plan Note (Signed)
Sleeping pattern is typical for geriatric patient.  No medications for now.

## 2021-02-20 NOTE — Progress Notes (Signed)
° ° °  SUBJECTIVE:   CHIEF COMPLAINT / HPI:   FU multiple issues: Overview: she is doing well.  She is pleased with her general health and activity level at age 88.  She is mentally alert.  She is in a faciltiy with her husband.  Unfortunately, his long term care insurance is running out and they will likely need to relocate.  This is a period recheck.  Specific issues: Chronic nasal congestion.  Well treated with asteline.  Asks if safe to use regularly. Systolic hypertension, low diastolic and wide pulse pressures.  Goal is to keep systolic BP below 544 and diastolic BP above 60.  She brings in a series of home readings, the vast majority of which are within these parameters. Wants to know about skin lestion on left leg. Insomnia.  Awakens frequently due to pain and need to urinate.  Slow to fall back to sleep.  She naps during the day.    OBJECTIVE:   BP (!) 180/95    Pulse 76    Ht 5\' 5"  (1.651 m)    Wt 167 lb 12.8 oz (76.1 kg)    SpO2 100%    BMI 27.92 kg/m   Lungs clear Cardiac RRR with 2/6 SEM Right leg lesion on shin, 1cm nodule.  Not red or draining. Does not feel cystic.  ASSESSMENT/PLAN:   Hypertension We are walking the tightrope.  BPs are mostly within our desired goals.   Aortic valve sclerosis Reviewed recent echo because of m.  Aortic sclerosis without stenosis.  Leg skin lesion, right Benign appearing nodule.  No further intervention unless grows.    Insomnia Sleeping pattern is typical for geriatric patient.  No medications for now.       [name], MD [place]

## 2021-02-20 NOTE — Assessment & Plan Note (Signed)
We are walking the tightrope.  BPs are mostly within our desired goals.

## 2021-02-23 DIAGNOSIS — I10 Essential (primary) hypertension: Secondary | ICD-10-CM | POA: Diagnosis not present

## 2021-02-23 DIAGNOSIS — M6281 Muscle weakness (generalized): Secondary | ICD-10-CM | POA: Diagnosis not present

## 2021-02-24 ENCOUNTER — Telehealth: Payer: Self-pay

## 2021-02-24 DIAGNOSIS — L989 Disorder of the skin and subcutaneous tissue, unspecified: Secondary | ICD-10-CM

## 2021-02-24 MED ORDER — DOXYCYCLINE HYCLATE 100 MG PO TABS: 100.0000 mg | ORAL_TABLET | Freq: Two times a day (BID) | ORAL | 0 refills | Status: DC

## 2021-02-24 NOTE — Telephone Encounter (Signed)
Called again and spoke to patient.  She had gotten my earlier message.

## 2021-02-24 NOTE — Telephone Encounter (Signed)
Patient calls nurse line regarding concerns for area on right leg. Patient reports that this was evaluated on 02/18/21. Patient reports that since appointment, area has become tender to the touch with redness and slight swelling. Denies fever, drainage or new growth.   Offered patient appointment this afternoon. Patient unable to come today due to transportation. Scheduled for tomorrow morning for re-evaluation. Provided patient with UC/ED precautions.   Talbot Grumbling, RN

## 2021-02-24 NOTE — Telephone Encounter (Signed)
Called and LM that I will treat as a local infection with antibiotics.

## 2021-02-25 ENCOUNTER — Ambulatory Visit: Payer: Medicare Other

## 2021-02-25 DIAGNOSIS — I10 Essential (primary) hypertension: Secondary | ICD-10-CM | POA: Diagnosis not present

## 2021-02-25 DIAGNOSIS — M6281 Muscle weakness (generalized): Secondary | ICD-10-CM | POA: Diagnosis not present

## 2021-02-25 NOTE — Progress Notes (Deleted)
° ° °  SUBJECTIVE:   CHIEF COMPLAINT / HPI:   Right Leg Wound  Carisa Backhaus is a 88 y.o. female who presents to the [facility] clinic for follow up on right leg wound. Was last seen in the office on 1/4 by her PCP. At that time, brought up concerns about a right leg lesion.  At that time was measuring about 1 cm and left benign.  Recommendation was for no further intervention unless it were to grow.  Patient called the clinic yesterday and reported that the area was swollen and red.  PCP started patient on a 10-day course of doxycycline.    PERTINENT  PMH / PSH:  Past Medical History:  Diagnosis Date   Allergy    Arthritis    Blood transfusion    Hyperlipidemia    Hypertension    Scoliosis     OBJECTIVE:   There were no vitals taken for this visit. ***  General: NAD, pleasant, able to participate in exam Cardiac: RRR, no murmurs. Respiratory: CTAB, normal effort, No wheezes, rales or rhonchi Abdomen: Bowel sounds present, nontender, nondistended, no hepatosplenomegaly. Extremities: no edema or cyanosis. Skin: warm and dry, no rashes noted Neuro: alert, no obvious focal deficits Psych: Normal affect and mood  ASSESSMENT/PLAN:   No problem-specific Assessment & Plan notes found for this encounter.     Sharion Settler, [place]

## 2021-02-26 DIAGNOSIS — M6281 Muscle weakness (generalized): Secondary | ICD-10-CM | POA: Diagnosis not present

## 2021-02-26 DIAGNOSIS — I10 Essential (primary) hypertension: Secondary | ICD-10-CM | POA: Diagnosis not present

## 2021-03-02 DIAGNOSIS — M6281 Muscle weakness (generalized): Secondary | ICD-10-CM | POA: Diagnosis not present

## 2021-03-02 DIAGNOSIS — I10 Essential (primary) hypertension: Secondary | ICD-10-CM | POA: Diagnosis not present

## 2021-03-02 NOTE — Telephone Encounter (Signed)
Patient calls nurse line. Patient states that area on right leg is not looking any better since beginning antibiotics. Continues to report redness and soreness. Denies drainage, fever, or increased swelling.   Patient is asking if she should continue antibiotic or if Dr. Andria Frames would like to re-evaluate the area.   Please advise.   Talbot Grumbling, RN

## 2021-03-02 NOTE — Addendum Note (Signed)
Addended by: [name] on: 03/02/2021 11:47 AM   Modules accepted: Orders

## 2021-03-02 NOTE — Telephone Encounter (Signed)
Called.  Lesion worse.  Offered choice between derm referral (she has previously seen Dr. Delman Cheadle) or excisional biopsy (difficult because lesion right over shin.)  She chose derm referral.  Entered.  Patient will also try on her own to call for appointment.

## 2021-03-04 DIAGNOSIS — M6281 Muscle weakness (generalized): Secondary | ICD-10-CM | POA: Diagnosis not present

## 2021-03-04 DIAGNOSIS — I10 Essential (primary) hypertension: Secondary | ICD-10-CM | POA: Diagnosis not present

## 2021-03-06 DIAGNOSIS — M6281 Muscle weakness (generalized): Secondary | ICD-10-CM | POA: Diagnosis not present

## 2021-03-06 DIAGNOSIS — I10 Essential (primary) hypertension: Secondary | ICD-10-CM | POA: Diagnosis not present

## 2021-03-12 DIAGNOSIS — I10 Essential (primary) hypertension: Secondary | ICD-10-CM | POA: Diagnosis not present

## 2021-03-12 DIAGNOSIS — M6281 Muscle weakness (generalized): Secondary | ICD-10-CM | POA: Diagnosis not present

## 2021-03-13 DIAGNOSIS — M6281 Muscle weakness (generalized): Secondary | ICD-10-CM | POA: Diagnosis not present

## 2021-03-13 DIAGNOSIS — I10 Essential (primary) hypertension: Secondary | ICD-10-CM | POA: Diagnosis not present

## 2021-03-16 DIAGNOSIS — C44722 Squamous cell carcinoma of skin of right lower limb, including hip: Secondary | ICD-10-CM | POA: Diagnosis not present

## 2021-03-17 DIAGNOSIS — M6281 Muscle weakness (generalized): Secondary | ICD-10-CM | POA: Diagnosis not present

## 2021-03-17 DIAGNOSIS — I10 Essential (primary) hypertension: Secondary | ICD-10-CM | POA: Diagnosis not present

## 2021-03-18 DIAGNOSIS — I10 Essential (primary) hypertension: Secondary | ICD-10-CM | POA: Diagnosis not present

## 2021-03-18 DIAGNOSIS — M6281 Muscle weakness (generalized): Secondary | ICD-10-CM | POA: Diagnosis not present

## 2021-03-19 DIAGNOSIS — M6281 Muscle weakness (generalized): Secondary | ICD-10-CM | POA: Diagnosis not present

## 2021-03-19 DIAGNOSIS — I10 Essential (primary) hypertension: Secondary | ICD-10-CM | POA: Diagnosis not present

## 2021-03-23 DIAGNOSIS — M6281 Muscle weakness (generalized): Secondary | ICD-10-CM | POA: Diagnosis not present

## 2021-03-23 DIAGNOSIS — I10 Essential (primary) hypertension: Secondary | ICD-10-CM | POA: Diagnosis not present

## 2021-03-25 DIAGNOSIS — M6281 Muscle weakness (generalized): Secondary | ICD-10-CM | POA: Diagnosis not present

## 2021-03-25 DIAGNOSIS — I10 Essential (primary) hypertension: Secondary | ICD-10-CM | POA: Diagnosis not present

## 2021-03-27 DIAGNOSIS — M6281 Muscle weakness (generalized): Secondary | ICD-10-CM | POA: Diagnosis not present

## 2021-03-27 DIAGNOSIS — I10 Essential (primary) hypertension: Secondary | ICD-10-CM | POA: Diagnosis not present

## 2021-03-30 DIAGNOSIS — Z20822 Contact with and (suspected) exposure to covid-19: Secondary | ICD-10-CM | POA: Diagnosis not present

## 2021-03-31 DIAGNOSIS — M6281 Muscle weakness (generalized): Secondary | ICD-10-CM | POA: Diagnosis not present

## 2021-03-31 DIAGNOSIS — I10 Essential (primary) hypertension: Secondary | ICD-10-CM | POA: Diagnosis not present

## 2021-04-01 DIAGNOSIS — M6281 Muscle weakness (generalized): Secondary | ICD-10-CM | POA: Diagnosis not present

## 2021-04-01 DIAGNOSIS — I10 Essential (primary) hypertension: Secondary | ICD-10-CM | POA: Diagnosis not present

## 2021-04-03 DIAGNOSIS — I10 Essential (primary) hypertension: Secondary | ICD-10-CM | POA: Diagnosis not present

## 2021-04-03 DIAGNOSIS — M6281 Muscle weakness (generalized): Secondary | ICD-10-CM | POA: Diagnosis not present

## 2021-04-04 ENCOUNTER — Other Ambulatory Visit: Payer: Self-pay | Admitting: Family Medicine

## 2021-04-04 DIAGNOSIS — M545 Low back pain, unspecified: Secondary | ICD-10-CM

## 2021-04-06 DIAGNOSIS — M6281 Muscle weakness (generalized): Secondary | ICD-10-CM | POA: Diagnosis not present

## 2021-04-06 DIAGNOSIS — I10 Essential (primary) hypertension: Secondary | ICD-10-CM | POA: Diagnosis not present

## 2021-04-08 ENCOUNTER — Encounter: Payer: Self-pay | Admitting: Family Medicine

## 2021-04-08 ENCOUNTER — Other Ambulatory Visit: Payer: Self-pay

## 2021-04-08 ENCOUNTER — Ambulatory Visit (INDEPENDENT_AMBULATORY_CARE_PROVIDER_SITE_OTHER): Payer: Medicare Other | Admitting: Family Medicine

## 2021-04-08 DIAGNOSIS — Z111 Encounter for screening for respiratory tuberculosis: Secondary | ICD-10-CM | POA: Diagnosis not present

## 2021-04-08 DIAGNOSIS — I10 Essential (primary) hypertension: Secondary | ICD-10-CM

## 2021-04-08 DIAGNOSIS — F4322 Adjustment disorder with anxiety: Secondary | ICD-10-CM | POA: Diagnosis not present

## 2021-04-08 DIAGNOSIS — E7800 Pure hypercholesterolemia, unspecified: Secondary | ICD-10-CM

## 2021-04-08 DIAGNOSIS — M6281 Muscle weakness (generalized): Secondary | ICD-10-CM | POA: Diagnosis not present

## 2021-04-08 MED ORDER — BUSPIRONE HCL 5 MG PO TABS: 5.0000 mg | ORAL_TABLET | Freq: Three times a day (TID) | ORAL | 3 refills | Status: DC | PRN

## 2021-04-08 NOTE — Patient Instructions (Addendum)
Call me to let me know about the new pharmacy.  I will refill the aztelin and bursprione when you I know.   See me in 2-3 months when you are settled in.   I will call Randall Hiss with lab test results.  [phone]

## 2021-04-09 ENCOUNTER — Encounter: Payer: Self-pay | Admitting: Family Medicine

## 2021-04-09 NOTE — Assessment & Plan Note (Signed)
Quant gold.  Screening required by new facility.

## 2021-04-09 NOTE — Progress Notes (Signed)
° ° °  SUBJECTIVE:   CHIEF COMPLAINT / HPI:   Not an annual physical: Multiple issues Prime issue is that she and her husband are moving from their facility in [place] (He in memory unit and she in independent living) to [facility] in [place].  Needs New FL2 form and also TB screening.  For a variety of reasons, this seems to be a good move for them.  I believe my med list is up to date, but they did not bring their meds or a list of their meds.  Both come with son, Randall Hiss, who is helping them handle their affairs and she gives permission for me to discuss their healthcare with Randall Hiss.  New FL2 form filled out. Needs refills on aztelin and buspirone.  They have not yet established with a new pharmacy in [place].  She will call when they do.   Hypertension.  Systolic hypertension and diastolic hypotension.  Very wide pulse pressures/  No chest pain or SOB High cholesterol primary prevention (but she did have advanced cornary artery disease as an incidental finding.)     OBJECTIVE:   BP (!) 162/80 (BP Location: Right Arm, Cuff Size: Large)    Pulse 72    Wt 168 lb (76.2 kg)    SpO2 98%    BMI 27.96 kg/m   Lungs clear Cardiac RRR with 2/6 SEM Ext bilateral trace edema  ASSESSMENT/PLAN:   Hypertension No change based on repeat BP  Screening-pulmonary TB Quant gold.  Screening required by new facility.  Hypercholesteremia Lipid panel.  Continue pravastatin.  Adjustment reaction She desires to continue buspirone.  Transfer of homes will be a stress reliever in the long run but is stressful in the short run.     [name], MD [place]

## 2021-04-09 NOTE — Assessment & Plan Note (Signed)
Lipid panel.  Continue pravastatin.

## 2021-04-09 NOTE — Assessment & Plan Note (Signed)
She desires to continue buspirone.  Transfer of homes will be a stress reliever in the long run but is stressful in the short run.

## 2021-04-09 NOTE — Assessment & Plan Note (Signed)
No change based on repeat BP

## 2021-04-10 ENCOUNTER — Telehealth: Payer: Self-pay

## 2021-04-10 ENCOUNTER — Telehealth: Payer: Self-pay | Admitting: Family Medicine

## 2021-04-10 DIAGNOSIS — I10 Essential (primary) hypertension: Secondary | ICD-10-CM | POA: Diagnosis not present

## 2021-04-10 DIAGNOSIS — M6281 Muscle weakness (generalized): Secondary | ICD-10-CM | POA: Diagnosis not present

## 2021-04-10 NOTE — Telephone Encounter (Signed)
Patient's son calls nurse line regarding issues with FL2 paperwork. Son reports that revision needs to be made regarding patient not being independent and needing to live in assisted living.   Son will either fax over paperwork or bring copy back to our office for revision.   Talbot Grumbling, RN

## 2021-04-10 NOTE — Telephone Encounter (Signed)
Patient's son dropped off paper that needs to be corrected.  11 on form Recommended level of care needs to be checked off for domiciliary (rest home) not independent.  Please check rest home X on form.  Please fax information to [facility] # on form.  Also patient son is requesting TB Lab results sent to facility at [facility] any questions please call patient's son.

## 2021-04-11 LAB — CMP14+EGFR
ALT: 10 IU/L (ref 0–32)
AST: 17 IU/L (ref 0–40)
Albumin/Globulin Ratio: 2.5 — ABNORMAL HIGH (ref 1.2–2.2)
Albumin: 4.9 g/dL — ABNORMAL HIGH (ref 3.6–4.6)
Alkaline Phosphatase: 93 IU/L (ref 44–121)
BUN/Creatinine Ratio: 21 (ref 12–28)
BUN: 17 mg/dL (ref 8–27)
Bilirubin Total: 0.3 mg/dL (ref 0.0–1.2)
CO2: 22 mmol/L (ref 20–29)
Calcium: 9.5 mg/dL (ref 8.7–10.3)
Chloride: 102 mmol/L (ref 96–106)
Creatinine, Ser: 0.82 mg/dL (ref 0.57–1.00)
Globulin, Total: 2 g/dL (ref 1.5–4.5)
Glucose: 90 mg/dL (ref 70–99)
Potassium: 4.5 mmol/L (ref 3.5–5.2)
Sodium: 140 mmol/L (ref 134–144)
Total Protein: 6.9 g/dL (ref 6.0–8.5)
eGFR: 69 mL/min/{1.73_m2} (ref >59)

## 2021-04-11 LAB — LIPID PANEL
Chol/HDL Ratio: 3.1 ratio (ref 0.0–4.4)
Cholesterol, Total: 194 mg/dL (ref 100–199)
HDL: 63 mg/dL (ref >39)
LDL Chol Calc (NIH): 110 mg/dL — ABNORMAL HIGH (ref 0–99)
Triglycerides: 117 mg/dL (ref 0–149)
VLDL Cholesterol Cal: 21 mg/dL (ref 5–40)

## 2021-04-11 LAB — QUANTIFERON-TB GOLD PLUS
QuantiFERON Mitogen Value: >10 IU/mL
QuantiFERON Nil Value: 0 IU/mL
QuantiFERON TB1 Ag Value: 0 IU/mL
QuantiFERON TB2 Ag Value: 0 IU/mL
QuantiFERON-TB Gold Plus: NEGATIVE

## 2021-04-11 LAB — CBC
Hematocrit: 39.2 % (ref 34.0–46.6)
Hemoglobin: 13.2 g/dL (ref 11.1–15.9)
MCH: 31.1 pg (ref 26.6–33.0)
MCHC: 33.7 g/dL (ref 31.5–35.7)
MCV: 93 fL (ref 79–97)
Platelets: 203 10*3/uL (ref 150–450)
RBC: 4.24 x10E6/uL (ref 3.77–5.28)
RDW: 13.1 % (ref 11.7–15.4)
WBC: 10.8 10*3/uL (ref 3.4–10.8)

## 2021-04-13 DIAGNOSIS — I10 Essential (primary) hypertension: Secondary | ICD-10-CM | POA: Diagnosis not present

## 2021-04-13 DIAGNOSIS — M6281 Muscle weakness (generalized): Secondary | ICD-10-CM | POA: Diagnosis not present

## 2021-04-14 DIAGNOSIS — M6281 Muscle weakness (generalized): Secondary | ICD-10-CM | POA: Diagnosis not present

## 2021-04-14 DIAGNOSIS — I10 Essential (primary) hypertension: Secondary | ICD-10-CM | POA: Diagnosis not present

## 2021-04-14 NOTE — Telephone Encounter (Signed)
Done and placed in RN box 

## 2021-04-14 NOTE — Telephone Encounter (Signed)
Clinical info completed on FL2 form.  Placed form in Dr. Lowella Bandy box for completion.  Ottis Stain, CMA

## 2021-04-14 NOTE — Telephone Encounter (Signed)
Noted and agree. 

## 2021-04-16 DIAGNOSIS — M6281 Muscle weakness (generalized): Secondary | ICD-10-CM | POA: Diagnosis not present

## 2021-04-16 DIAGNOSIS — I10 Essential (primary) hypertension: Secondary | ICD-10-CM | POA: Diagnosis not present

## 2021-04-16 NOTE — Telephone Encounter (Signed)
Son calls back.  ? ?Son will pick up paperwork tomorrow and take to [facility].  ?

## 2021-04-16 NOTE — Telephone Encounter (Signed)
Called son to inform that form has been completed. Son did not answer, LVM, asking that patient return phone call as we are unable to fax this to [facility] due to not having a ROI.  ? ?When son calls back, please inform that he can pick up paperwork or come into the office and sign the release, at that point we could fax the paperwork.  ? ?Talbot Grumbling, RN ? ?

## 2021-04-20 DIAGNOSIS — I10 Essential (primary) hypertension: Secondary | ICD-10-CM | POA: Diagnosis not present

## 2021-04-20 DIAGNOSIS — M6281 Muscle weakness (generalized): Secondary | ICD-10-CM | POA: Diagnosis not present

## 2021-04-22 DIAGNOSIS — M6281 Muscle weakness (generalized): Secondary | ICD-10-CM | POA: Diagnosis not present

## 2021-04-22 DIAGNOSIS — I10 Essential (primary) hypertension: Secondary | ICD-10-CM | POA: Diagnosis not present

## 2021-04-24 DIAGNOSIS — M6281 Muscle weakness (generalized): Secondary | ICD-10-CM | POA: Diagnosis not present

## 2021-04-24 DIAGNOSIS — I10 Essential (primary) hypertension: Secondary | ICD-10-CM | POA: Diagnosis not present

## 2021-04-30 DIAGNOSIS — I10 Essential (primary) hypertension: Secondary | ICD-10-CM | POA: Diagnosis not present

## 2021-04-30 DIAGNOSIS — M6281 Muscle weakness (generalized): Secondary | ICD-10-CM | POA: Diagnosis not present

## 2021-05-11 DIAGNOSIS — Z20822 Contact with and (suspected) exposure to covid-19: Secondary | ICD-10-CM | POA: Diagnosis not present

## 2021-05-22 DIAGNOSIS — Z20822 Contact with and (suspected) exposure to covid-19: Secondary | ICD-10-CM | POA: Diagnosis not present

## 2021-06-01 ENCOUNTER — Telehealth: Payer: Self-pay

## 2021-06-01 DIAGNOSIS — R059 Cough, unspecified: Secondary | ICD-10-CM

## 2021-06-01 DIAGNOSIS — Z20822 Contact with and (suspected) exposure to covid-19: Secondary | ICD-10-CM | POA: Diagnosis not present

## 2021-06-01 NOTE — Telephone Encounter (Signed)
Patient calls nurse line reporting persistent cough for ~2-3 weeks.  ? ?Patient denies any other symptoms, congestion, SOB, fevers or chills. Patient reports the cough is dry and unproductive. No sick contacts. ? ?Patient reports she has tried OTC nyquil, coricidin and nasal sprays without relief.  ? ?Apt offered to patient, however she is out of town in [place], [place] visiting family.  ? ?Patient requesting something to help control her cough. ? ?Pharmacy will be [facility] on [address]. This has been added.  ?

## 2021-06-04 DIAGNOSIS — R059 Cough, unspecified: Secondary | ICD-10-CM | POA: Insufficient documentation

## 2021-06-04 MED ORDER — BENZONATATE 100 MG PO CAPS: 100.0000 mg | ORAL_CAPSULE | Freq: Three times a day (TID) | ORAL | 1 refills | Status: DC

## 2021-06-04 NOTE — Telephone Encounter (Signed)
Called.  Cough which she typically gets in April each year and has responded to astelin in the past, but not this year.  Not sick, no fever, just nagging cough.  Recommended honey.  Also try tessalon pearles. ?

## 2021-06-16 ENCOUNTER — Other Ambulatory Visit: Payer: Self-pay | Admitting: Family Medicine

## 2021-06-16 DIAGNOSIS — I10 Essential (primary) hypertension: Secondary | ICD-10-CM

## 2021-06-22 DIAGNOSIS — Z20822 Contact with and (suspected) exposure to covid-19: Secondary | ICD-10-CM | POA: Diagnosis not present

## 2021-07-21 ENCOUNTER — Encounter: Payer: Self-pay | Admitting: *Deleted

## 2021-07-23 ENCOUNTER — Other Ambulatory Visit: Payer: Self-pay

## 2021-07-23 MED ORDER — AZELASTINE HCL 0.1 % NA SOLN: 1.0000 | Freq: Two times a day (BID) | NASAL | 3 refills | Status: DC

## 2021-07-29 ENCOUNTER — Other Ambulatory Visit: Payer: Self-pay | Admitting: Family Medicine

## 2021-09-03 ENCOUNTER — Ambulatory Visit (INDEPENDENT_AMBULATORY_CARE_PROVIDER_SITE_OTHER): Payer: Medicare Other | Admitting: Family Medicine

## 2021-09-03 ENCOUNTER — Encounter: Payer: Self-pay | Admitting: Family Medicine

## 2021-09-03 DIAGNOSIS — M47812 Spondylosis without myelopathy or radiculopathy, cervical region: Secondary | ICD-10-CM | POA: Diagnosis not present

## 2021-09-03 DIAGNOSIS — I10 Essential (primary) hypertension: Secondary | ICD-10-CM

## 2021-09-03 NOTE — Patient Instructions (Signed)
I will call the blood test results.  Likely I will tell you that you should increase your spironolactone to one pill per day. Forget about bananas. Let me know if I need to do anything else to get the PT started.

## 2021-09-03 NOTE — Progress Notes (Signed)
  Date of Visit: 19/14/7829   HPI:  Gina Bishop is a 88 y.o. female here for follow-up.  Hypertension She has been taking Lotensin and Spironolactone with no side effects. Her blood pressure log today shows home blood pressures of 150s-160s/70s-80s.   Headache Patient reports she has been having headaches x2-3 weeks. She has chronic R neck and shoulder pain that has worsened the past few weeks. Pain is contributing to headaches. She has tried Tylenol without relief. She has done exercises from prior PT sessions which has given her some relief.  Hearing loss Patient reports she has increased hearing loss these past few months. She hears better in quiet rooms but reports difficulty hearing when there are multiple people in a room.    PHYSICAL EXAM: BP (!) 179/83   Pulse 80   Ht '5\' 5"'$  (1.651 m)   Wt 165 lb 6.4 oz (75 kg)   SpO2 97%   BMI 27.52 kg/m  Gen: Well-appearing female in no acute distress. CV: Normal heart rate. Pulm: No increased work of breathing. Neuro: Alert and oriented.  ASSESSMENT/PLAN:  Hypertension Patient is not at goal <160/90. Continue Lotensin '40mg'$  qD. Increase Spironolactone '25mg'$  to 1 tablet daily. Continue to monitor and record home BPs. BMP today.  Headache and right shoulder pain Refer to in house PT at [facility].  Hearing loss Follow-up with audiologist at [facility] for hearing aids.   Gina Bishop

## 2021-09-03 NOTE — Assessment & Plan Note (Addendum)
Patient is not at goal <160/90. Continue Lotensin '40mg'$  qD. Increase Spironolactone '25mg'$  to 1 tablet daily. Continue to monitor and record home BPs. BMP today. Called patient with BMP results.  K+ is fine.  Sodium is a bit low.  Discussed choice of increasing spironolactone and monitoring K+ and Na+ versus starting another drug.  She chose increase spironolactone.  Repeat BMP in one month.  Lab only visit if tolerating meds and home BPs are at goal.  See me if BP still high or symptoms.

## 2021-09-04 ENCOUNTER — Encounter: Payer: Self-pay | Admitting: Family Medicine

## 2021-09-04 LAB — BASIC METABOLIC PANEL
BUN/Creatinine Ratio: 24 (ref 12–28)
BUN: 23 mg/dL (ref 8–27)
CO2: 22 mmol/L (ref 20–29)
Calcium: 9.8 mg/dL (ref 8.7–10.3)
Chloride: 97 mmol/L (ref 96–106)
Creatinine, Ser: 0.94 mg/dL (ref 0.57–1.00)
Glucose: 106 mg/dL — ABNORMAL HIGH (ref 70–99)
Potassium: 4.6 mmol/L (ref 3.5–5.2)
Sodium: 132 mmol/L — ABNORMAL LOW (ref 134–144)
eGFR: 59 mL/min/{1.73_m2} — ABNORMAL LOW (ref >59)

## 2021-09-04 MED ORDER — SPIRONOLACTONE 25 MG PO TABS: 25.0000 mg | ORAL_TABLET | Freq: Every day | ORAL | 3 refills | Status: DC

## 2021-09-04 NOTE — Assessment & Plan Note (Signed)
PT ordered per patient request. I endorse this.  Periodic PT for this chronic problem is preferred over increased medications.

## 2021-09-04 NOTE — Progress Notes (Signed)
Agree with nice note of MS3 Gina Bishop.   HBP is the main issue.  Wide pulse pressure and side effects of some common classes of medications.  BP still not at goal.  Low sodium has been a problem in the past.  The combo of ACE and spiro can cause hyperkalemia.  We agreed to check BMP and if OK likely increase spiro to 25 mg daily.  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  C/O neck and should stiffness and increasing headache.  This has responded well to PT in the past.  Reordered per patient request.   Hearing loss.  Endorsed hearing aids.  I do not have any experience with OTC hearing aids.  She will see [facility] audiologist and ask about their OTC hearing aids.

## 2021-09-11 DIAGNOSIS — R300 Dysuria: Secondary | ICD-10-CM | POA: Diagnosis not present

## 2021-09-17 DIAGNOSIS — R279 Unspecified lack of coordination: Secondary | ICD-10-CM | POA: Diagnosis not present

## 2021-09-17 DIAGNOSIS — M6281 Muscle weakness (generalized): Secondary | ICD-10-CM | POA: Diagnosis not present

## 2021-09-21 ENCOUNTER — Other Ambulatory Visit: Payer: Self-pay | Admitting: Family Medicine

## 2021-09-21 DIAGNOSIS — M6281 Muscle weakness (generalized): Secondary | ICD-10-CM | POA: Diagnosis not present

## 2021-09-21 DIAGNOSIS — I10 Essential (primary) hypertension: Secondary | ICD-10-CM

## 2021-09-21 DIAGNOSIS — R279 Unspecified lack of coordination: Secondary | ICD-10-CM | POA: Diagnosis not present

## 2021-09-22 NOTE — Telephone Encounter (Signed)
Patient calls nurse line regarding spironolactone prescription. Reports that she is supposed to be taking one tablet daily. However, last prescription was sent in for 0.5 tablets daily. Prescription needs to be updated to reflect that she takes one tablet daily in order for insurance to cover medication.   [facility] send new [facility] to [facility] [address]. Forwarding to PCP.   Talbot Grumbling, RN

## 2021-09-23 DIAGNOSIS — R279 Unspecified lack of coordination: Secondary | ICD-10-CM | POA: Diagnosis not present

## 2021-09-23 DIAGNOSIS — M6281 Muscle weakness (generalized): Secondary | ICD-10-CM | POA: Diagnosis not present

## 2021-09-24 MED ORDER — SPIRONOLACTONE 25 MG PO TABS: 25.0000 mg | ORAL_TABLET | Freq: Every day | ORAL | 0 refills | Status: DC

## 2021-09-24 NOTE — Addendum Note (Signed)
Addended by: Lissa Morales D on: 09/24/2021 03:13 PM   Modules accepted: Orders

## 2021-09-25 DIAGNOSIS — M6281 Muscle weakness (generalized): Secondary | ICD-10-CM | POA: Diagnosis not present

## 2021-09-25 DIAGNOSIS — R279 Unspecified lack of coordination: Secondary | ICD-10-CM | POA: Diagnosis not present

## 2021-09-30 ENCOUNTER — Ambulatory Visit: Payer: Self-pay

## 2021-09-30 NOTE — Patient Outreach (Signed)
  Care Coordination   3/64/6803 Name: Gina Bishop MRN: [id number] DOB: [birth year]   Care Coordination Outreach Attempts:  An unsuccessful telephone outreach was attempted today to offer the patient information about available care coordination services as a benefit of their health plan.   Follow Up Plan:  Additional outreach attempts will be made to offer the patient care coordination information and services.   Encounter Outcome:  No Answer  Care Coordination Interventions Activated:  No   Care Coordination Interventions:  No, not indicated    Lazaro Arms RN, BSN, [facility]   Phone: [phone]

## 2021-10-01 ENCOUNTER — Ambulatory Visit: Payer: Self-pay

## 2021-10-01 DIAGNOSIS — R279 Unspecified lack of coordination: Secondary | ICD-10-CM | POA: Diagnosis not present

## 2021-10-01 DIAGNOSIS — M6281 Muscle weakness (generalized): Secondary | ICD-10-CM | POA: Diagnosis not present

## 2021-10-01 NOTE — Patient Instructions (Signed)
Visit Information  Thank you for taking time to visit with me today. Please don't hesitate to contact me if I can be of assistance to you.   Following are the goals we discussed today:   Goals Addressed               This Visit's Progress     COMPLETED: Can I use Voltern gel on my neck (pt-stated)        Care Coordination Interventions: I reviewed Voltaren gel with the patient and discussed the area she wanted to use. It can also be used to treat acute neck pain. During my conversation with the patient,  It was emphasized that applying the medication to damaged skin should be avoided to prevent potential adverse effects. 0002 I spoke with the patient regarding their the AWV: per the patient, they have had their AWV, and I will send the information to appropriate people. Active listening / Reflection utilized  Problem [place] strategies reviewed        Patient verbalizes understanding of instructions and care plan provided today and agrees to view in [place]. Active MyChart status and patient understanding of how to access instructions and care plan via MyChart confirmed with patient.      Lazaro Arms RN, BSN, [facility]   Phone: [phone]

## 2021-10-01 NOTE — Patient Outreach (Addendum)
  Care Coordination   Initial Visit Note   8/56/3149 Name: Gina Bishop MRN: [id number] DOB: [birth year]  Gina Bishop is a 88 y.o. year old female who sees Hensel, Jamal Collin, MD for primary care. I spoke with  Gina Bishop by phone today  What matters to the patients health and wellness today?  My neck hurts.     Goals Addressed               This Visit's Progress     COMPLETED: Can I use Voltern gel on my neck (pt-stated)        Care Coordination Interventions: I reviewed Voltaren gel with the patient and discussed the area she wanted to use. It can also be used to treat acute neck pain. During my conversation with the patient,  It was emphasized that applying the medication to damaged skin should be avoided to prevent potential adverse effects. 0002 I spoke with the patient regarding their the AWV: per the patient, they have had their AWV, and I will send the information to appropriate people. Active listening / Reflection utilized  Problem Solving /Task Center strategies reviewed         SDOH assessments and interventions completed:  Yes  SDOH Interventions Today    Flowsheet Row Most Recent Value  SDOH Interventions   Food Insecurity Interventions Intervention Not Indicated  Housing Interventions Intervention Not Indicated  Transportation Interventions Intervention Not Indicated        Care Coordination Interventions Activated:  Yes  Care Coordination Interventions:  Yes, provided   Follow up plan: No further intervention required.   Encounter Outcome:  Pt. Visit Completed   Lazaro Arms RN, BSN, [place] [facility]   Phone: [phone]

## 2021-10-04 DIAGNOSIS — R279 Unspecified lack of coordination: Secondary | ICD-10-CM | POA: Diagnosis not present

## 2021-10-04 DIAGNOSIS — M6281 Muscle weakness (generalized): Secondary | ICD-10-CM | POA: Diagnosis not present

## 2021-10-07 DIAGNOSIS — M6281 Muscle weakness (generalized): Secondary | ICD-10-CM | POA: Diagnosis not present

## 2021-10-07 DIAGNOSIS — R279 Unspecified lack of coordination: Secondary | ICD-10-CM | POA: Diagnosis not present

## 2021-10-08 DIAGNOSIS — M6281 Muscle weakness (generalized): Secondary | ICD-10-CM | POA: Diagnosis not present

## 2021-10-08 DIAGNOSIS — R279 Unspecified lack of coordination: Secondary | ICD-10-CM | POA: Diagnosis not present

## 2021-10-14 DIAGNOSIS — R279 Unspecified lack of coordination: Secondary | ICD-10-CM | POA: Diagnosis not present

## 2021-10-14 DIAGNOSIS — M6281 Muscle weakness (generalized): Secondary | ICD-10-CM | POA: Diagnosis not present

## 2021-10-15 DIAGNOSIS — M6281 Muscle weakness (generalized): Secondary | ICD-10-CM | POA: Diagnosis not present

## 2021-10-15 DIAGNOSIS — R279 Unspecified lack of coordination: Secondary | ICD-10-CM | POA: Diagnosis not present

## 2021-10-19 DIAGNOSIS — M6281 Muscle weakness (generalized): Secondary | ICD-10-CM | POA: Diagnosis not present

## 2021-10-19 DIAGNOSIS — R279 Unspecified lack of coordination: Secondary | ICD-10-CM | POA: Diagnosis not present

## 2021-10-21 DIAGNOSIS — R279 Unspecified lack of coordination: Secondary | ICD-10-CM | POA: Diagnosis not present

## 2021-10-21 DIAGNOSIS — M6281 Muscle weakness (generalized): Secondary | ICD-10-CM | POA: Diagnosis not present

## 2021-10-27 DIAGNOSIS — M6281 Muscle weakness (generalized): Secondary | ICD-10-CM | POA: Diagnosis not present

## 2021-10-27 DIAGNOSIS — R279 Unspecified lack of coordination: Secondary | ICD-10-CM | POA: Diagnosis not present

## 2021-10-29 DIAGNOSIS — M6281 Muscle weakness (generalized): Secondary | ICD-10-CM | POA: Diagnosis not present

## 2021-10-29 DIAGNOSIS — R279 Unspecified lack of coordination: Secondary | ICD-10-CM | POA: Diagnosis not present

## 2021-11-04 DIAGNOSIS — M6281 Muscle weakness (generalized): Secondary | ICD-10-CM | POA: Diagnosis not present

## 2021-11-04 DIAGNOSIS — R279 Unspecified lack of coordination: Secondary | ICD-10-CM | POA: Diagnosis not present

## 2021-11-09 DIAGNOSIS — R279 Unspecified lack of coordination: Secondary | ICD-10-CM | POA: Diagnosis not present

## 2021-11-09 DIAGNOSIS — M6281 Muscle weakness (generalized): Secondary | ICD-10-CM | POA: Diagnosis not present

## 2021-11-11 DIAGNOSIS — M6281 Muscle weakness (generalized): Secondary | ICD-10-CM | POA: Diagnosis not present

## 2021-11-11 DIAGNOSIS — R279 Unspecified lack of coordination: Secondary | ICD-10-CM | POA: Diagnosis not present

## 2021-11-16 DIAGNOSIS — M6281 Muscle weakness (generalized): Secondary | ICD-10-CM | POA: Diagnosis not present

## 2021-11-16 DIAGNOSIS — R279 Unspecified lack of coordination: Secondary | ICD-10-CM | POA: Diagnosis not present

## 2021-11-20 DIAGNOSIS — M6281 Muscle weakness (generalized): Secondary | ICD-10-CM | POA: Diagnosis not present

## 2021-11-20 DIAGNOSIS — R279 Unspecified lack of coordination: Secondary | ICD-10-CM | POA: Diagnosis not present

## 2021-11-25 DIAGNOSIS — M6281 Muscle weakness (generalized): Secondary | ICD-10-CM | POA: Diagnosis not present

## 2021-11-25 DIAGNOSIS — R279 Unspecified lack of coordination: Secondary | ICD-10-CM | POA: Diagnosis not present

## 2021-12-02 DIAGNOSIS — M6281 Muscle weakness (generalized): Secondary | ICD-10-CM | POA: Diagnosis not present

## 2021-12-02 DIAGNOSIS — R279 Unspecified lack of coordination: Secondary | ICD-10-CM | POA: Diagnosis not present

## 2021-12-04 DIAGNOSIS — M6281 Muscle weakness (generalized): Secondary | ICD-10-CM | POA: Diagnosis not present

## 2021-12-04 DIAGNOSIS — R279 Unspecified lack of coordination: Secondary | ICD-10-CM | POA: Diagnosis not present

## 2021-12-07 DIAGNOSIS — R279 Unspecified lack of coordination: Secondary | ICD-10-CM | POA: Diagnosis not present

## 2021-12-07 DIAGNOSIS — M6281 Muscle weakness (generalized): Secondary | ICD-10-CM | POA: Diagnosis not present

## 2021-12-10 ENCOUNTER — Other Ambulatory Visit: Payer: Medicare Other

## 2021-12-10 ENCOUNTER — Ambulatory Visit (INDEPENDENT_AMBULATORY_CARE_PROVIDER_SITE_OTHER): Payer: Medicare Other

## 2021-12-10 DIAGNOSIS — Z23 Encounter for immunization: Secondary | ICD-10-CM | POA: Diagnosis not present

## 2021-12-10 DIAGNOSIS — I10 Essential (primary) hypertension: Secondary | ICD-10-CM | POA: Diagnosis not present

## 2021-12-11 DIAGNOSIS — M6281 Muscle weakness (generalized): Secondary | ICD-10-CM | POA: Diagnosis not present

## 2021-12-11 DIAGNOSIS — R279 Unspecified lack of coordination: Secondary | ICD-10-CM | POA: Diagnosis not present

## 2021-12-11 LAB — BASIC METABOLIC PANEL
BUN/Creatinine Ratio: 24 (ref 12–28)
BUN: 21 mg/dL (ref 8–27)
CO2: 23 mmol/L (ref 20–29)
Calcium: 9.5 mg/dL (ref 8.7–10.3)
Chloride: 102 mmol/L (ref 96–106)
Creatinine, Ser: 0.86 mg/dL (ref 0.57–1.00)
Glucose: 86 mg/dL (ref 70–99)
Potassium: 4.8 mmol/L (ref 3.5–5.2)
Sodium: 139 mmol/L (ref 134–144)
eGFR: 65 mL/min/{1.73_m2} (ref >59)

## 2021-12-14 DIAGNOSIS — M6281 Muscle weakness (generalized): Secondary | ICD-10-CM | POA: Diagnosis not present

## 2021-12-14 DIAGNOSIS — R279 Unspecified lack of coordination: Secondary | ICD-10-CM | POA: Diagnosis not present

## 2021-12-16 DIAGNOSIS — R279 Unspecified lack of coordination: Secondary | ICD-10-CM | POA: Diagnosis not present

## 2021-12-16 DIAGNOSIS — M6281 Muscle weakness (generalized): Secondary | ICD-10-CM | POA: Diagnosis not present

## 2021-12-22 DIAGNOSIS — M6281 Muscle weakness (generalized): Secondary | ICD-10-CM | POA: Diagnosis not present

## 2021-12-22 DIAGNOSIS — R279 Unspecified lack of coordination: Secondary | ICD-10-CM | POA: Diagnosis not present

## 2021-12-24 DIAGNOSIS — R279 Unspecified lack of coordination: Secondary | ICD-10-CM | POA: Diagnosis not present

## 2021-12-24 DIAGNOSIS — M6281 Muscle weakness (generalized): Secondary | ICD-10-CM | POA: Diagnosis not present

## 2021-12-28 ENCOUNTER — Telehealth: Payer: Self-pay

## 2021-12-28 DIAGNOSIS — R279 Unspecified lack of coordination: Secondary | ICD-10-CM | POA: Diagnosis not present

## 2021-12-28 DIAGNOSIS — M6281 Muscle weakness (generalized): Secondary | ICD-10-CM | POA: Diagnosis not present

## 2021-12-28 NOTE — Telephone Encounter (Signed)
Patient calls nurse line with questions regarding pain management for back pain.   She is asking if it would be better for her to apply salonpas patches or Voltaren gel.   Please advise.   Talbot Grumbling, RN

## 2021-12-31 NOTE — Telephone Encounter (Signed)
Called patient to provide with instructions. Answered all questions.   Talbot Grumbling, RN

## 2022-01-01 DIAGNOSIS — R279 Unspecified lack of coordination: Secondary | ICD-10-CM | POA: Diagnosis not present

## 2022-01-01 DIAGNOSIS — M6281 Muscle weakness (generalized): Secondary | ICD-10-CM | POA: Diagnosis not present

## 2022-01-05 DIAGNOSIS — M6281 Muscle weakness (generalized): Secondary | ICD-10-CM | POA: Diagnosis not present

## 2022-01-05 DIAGNOSIS — R279 Unspecified lack of coordination: Secondary | ICD-10-CM | POA: Diagnosis not present

## 2022-01-12 DIAGNOSIS — R279 Unspecified lack of coordination: Secondary | ICD-10-CM | POA: Diagnosis not present

## 2022-01-12 DIAGNOSIS — M6281 Muscle weakness (generalized): Secondary | ICD-10-CM | POA: Diagnosis not present

## 2022-01-14 DIAGNOSIS — R279 Unspecified lack of coordination: Secondary | ICD-10-CM | POA: Diagnosis not present

## 2022-01-14 DIAGNOSIS — M6281 Muscle weakness (generalized): Secondary | ICD-10-CM | POA: Diagnosis not present

## 2022-01-19 DIAGNOSIS — R279 Unspecified lack of coordination: Secondary | ICD-10-CM | POA: Diagnosis not present

## 2022-01-19 DIAGNOSIS — M6281 Muscle weakness (generalized): Secondary | ICD-10-CM | POA: Diagnosis not present

## 2022-01-21 DIAGNOSIS — M6281 Muscle weakness (generalized): Secondary | ICD-10-CM | POA: Diagnosis not present

## 2022-01-21 DIAGNOSIS — R279 Unspecified lack of coordination: Secondary | ICD-10-CM | POA: Diagnosis not present

## 2022-01-23 ENCOUNTER — Other Ambulatory Visit: Payer: Self-pay | Admitting: Family Medicine

## 2022-01-23 DIAGNOSIS — I10 Essential (primary) hypertension: Secondary | ICD-10-CM

## 2022-01-26 DIAGNOSIS — M6281 Muscle weakness (generalized): Secondary | ICD-10-CM | POA: Diagnosis not present

## 2022-01-26 DIAGNOSIS — R279 Unspecified lack of coordination: Secondary | ICD-10-CM | POA: Diagnosis not present

## 2022-01-28 DIAGNOSIS — H02834 Dermatochalasis of left upper eyelid: Secondary | ICD-10-CM | POA: Diagnosis not present

## 2022-01-28 DIAGNOSIS — H40023 Open angle with borderline findings, high risk, bilateral: Secondary | ICD-10-CM | POA: Diagnosis not present

## 2022-01-28 DIAGNOSIS — D3131 Benign neoplasm of right choroid: Secondary | ICD-10-CM | POA: Diagnosis not present

## 2022-01-28 DIAGNOSIS — H43813 Vitreous degeneration, bilateral: Secondary | ICD-10-CM | POA: Diagnosis not present

## 2022-01-28 DIAGNOSIS — H02831 Dermatochalasis of right upper eyelid: Secondary | ICD-10-CM | POA: Diagnosis not present

## 2022-01-28 DIAGNOSIS — Z961 Presence of intraocular lens: Secondary | ICD-10-CM | POA: Diagnosis not present

## 2022-02-01 DIAGNOSIS — R279 Unspecified lack of coordination: Secondary | ICD-10-CM | POA: Diagnosis not present

## 2022-02-01 DIAGNOSIS — M6281 Muscle weakness (generalized): Secondary | ICD-10-CM | POA: Diagnosis not present

## 2022-02-02 ENCOUNTER — Other Ambulatory Visit: Payer: Self-pay

## 2022-02-02 DIAGNOSIS — I10 Essential (primary) hypertension: Secondary | ICD-10-CM

## 2022-02-02 MED ORDER — SPIRONOLACTONE 25 MG PO TABS: 25.0000 mg | ORAL_TABLET | Freq: Every day | ORAL | 0 refills | Status: DC

## 2022-02-10 DIAGNOSIS — R279 Unspecified lack of coordination: Secondary | ICD-10-CM | POA: Diagnosis not present

## 2022-02-10 DIAGNOSIS — M6281 Muscle weakness (generalized): Secondary | ICD-10-CM | POA: Diagnosis not present

## 2022-02-17 DIAGNOSIS — M6281 Muscle weakness (generalized): Secondary | ICD-10-CM | POA: Diagnosis not present

## 2022-02-17 DIAGNOSIS — R279 Unspecified lack of coordination: Secondary | ICD-10-CM | POA: Diagnosis not present

## 2022-02-23 DIAGNOSIS — M6281 Muscle weakness (generalized): Secondary | ICD-10-CM | POA: Diagnosis not present

## 2022-02-23 DIAGNOSIS — R279 Unspecified lack of coordination: Secondary | ICD-10-CM | POA: Diagnosis not present

## 2022-03-03 DIAGNOSIS — M6281 Muscle weakness (generalized): Secondary | ICD-10-CM | POA: Diagnosis not present

## 2022-03-03 DIAGNOSIS — R279 Unspecified lack of coordination: Secondary | ICD-10-CM | POA: Diagnosis not present

## 2022-03-09 DIAGNOSIS — R279 Unspecified lack of coordination: Secondary | ICD-10-CM | POA: Diagnosis not present

## 2022-03-09 DIAGNOSIS — M6281 Muscle weakness (generalized): Secondary | ICD-10-CM | POA: Diagnosis not present

## 2022-03-12 ENCOUNTER — Telehealth: Payer: Self-pay

## 2022-03-12 DIAGNOSIS — R279 Unspecified lack of coordination: Secondary | ICD-10-CM | POA: Diagnosis not present

## 2022-03-12 DIAGNOSIS — M6281 Muscle weakness (generalized): Secondary | ICD-10-CM | POA: Diagnosis not present

## 2022-03-12 DIAGNOSIS — M47812 Spondylosis without myelopathy or radiculopathy, cervical region: Secondary | ICD-10-CM

## 2022-03-12 NOTE — Telephone Encounter (Signed)
Patient calls nurse line requesting orthopedic referral. She was previously established with Dr. Brigitte Pulse at [facility] in [facility]. She saw him last in 2020 for neck and shoulder issues. He advised her to come back if these issues returned.   She is asking for a new referral to this office. She has been using heat and PT exercises, however, she continues to have these issues.   See below for requested referral location.  [facility], [place] [address] [phone]  Talbot Grumbling, RN

## 2022-03-12 NOTE — Telephone Encounter (Signed)
Referral entered as requested.  

## 2022-03-16 DIAGNOSIS — R279 Unspecified lack of coordination: Secondary | ICD-10-CM | POA: Diagnosis not present

## 2022-03-16 DIAGNOSIS — M6281 Muscle weakness (generalized): Secondary | ICD-10-CM | POA: Diagnosis not present

## 2022-03-23 ENCOUNTER — Encounter: Payer: Self-pay | Admitting: Family Medicine

## 2022-03-23 DIAGNOSIS — M6281 Muscle weakness (generalized): Secondary | ICD-10-CM | POA: Diagnosis not present

## 2022-03-23 DIAGNOSIS — R279 Unspecified lack of coordination: Secondary | ICD-10-CM | POA: Diagnosis not present

## 2022-03-26 DIAGNOSIS — M6281 Muscle weakness (generalized): Secondary | ICD-10-CM | POA: Diagnosis not present

## 2022-03-26 DIAGNOSIS — R279 Unspecified lack of coordination: Secondary | ICD-10-CM | POA: Diagnosis not present

## 2022-03-31 DIAGNOSIS — M47812 Spondylosis without myelopathy or radiculopathy, cervical region: Secondary | ICD-10-CM | POA: Diagnosis not present

## 2022-03-31 DIAGNOSIS — M5031 Other cervical disc degeneration,  high cervical region: Secondary | ICD-10-CM | POA: Diagnosis not present

## 2022-03-31 DIAGNOSIS — M4312 Spondylolisthesis, cervical region: Secondary | ICD-10-CM | POA: Diagnosis not present

## 2022-03-31 DIAGNOSIS — M542 Cervicalgia: Secondary | ICD-10-CM | POA: Diagnosis not present

## 2022-04-19 ENCOUNTER — Telehealth: Payer: Self-pay | Admitting: Family Medicine

## 2022-04-19 NOTE — Telephone Encounter (Signed)
Contacted Gina Bishop to schedule their annual wellness visit. Appointment made for 04/27/2022.  Thank you,  [place] Direct dial  [phone]

## 2022-04-22 DIAGNOSIS — N390 Urinary tract infection, site not specified: Secondary | ICD-10-CM | POA: Diagnosis not present

## 2022-04-22 DIAGNOSIS — R350 Frequency of micturition: Secondary | ICD-10-CM | POA: Diagnosis not present

## 2022-04-27 ENCOUNTER — Ambulatory Visit (INDEPENDENT_AMBULATORY_CARE_PROVIDER_SITE_OTHER): Payer: Medicare Other

## 2022-04-27 ENCOUNTER — Encounter (HOSPITAL_BASED_OUTPATIENT_CLINIC_OR_DEPARTMENT_OTHER): Payer: Medicare Other

## 2022-04-27 VITALS — Ht 65.0 in | Wt 165.0 lb

## 2022-04-27 DIAGNOSIS — Z0000 Encounter for general adult medical examination without abnormal findings: Secondary | ICD-10-CM | POA: Diagnosis not present

## 2022-04-27 NOTE — Progress Notes (Signed)
Subjective:   Gina Bishop is a 89 y.o. female who presents for Medicare Annual (Subsequent) preventive examination.  Review of Systems    Virtual Visit via Telephone Note  I connected with  Gina Bishop on [id number] at 11:00 AM EDT by telephone and verified that I am speaking with the correct person using two identifiers.  Location: Patient: Home Provider: Office Persons participating in the virtual visit: patient/Nurse Health Advisor   I discussed the limitations, risks, security and privacy concerns of performing an evaluation and management service by telephone and the availability of in person appointments. The patient expressed understanding and agreed to proceed.  Interactive audio and video telecommunications were attempted between this nurse and patient, however failed, due to patient having technical difficulties OR patient did not have access to video capability.  We continued and completed visit with audio only.  Some vital signs may be absent or patient reported.   Criselda Peaches, LPN  Cardiac Risk Factors include: advanced age (>15mn, >>58women);hypertension     Objective:    Today's Vitals   04/27/22 1106  Weight: 165 lb (74.8 kg)  Height: '5\' 5"'$  (1.651 m)   Body mass index is 27.46 kg/m.     04/27/2022   11:17 AM 09/03/2021   10:30 AM 04/08/2021    3:41 PM 02/18/2021   11:03 AM 07/02/2020    1:53 PM 11/26/2019    8:44 AM 12/14/2018    9:49 AM  Advanced Directives  Does Patient Have a Medical Advance Directive? Yes No No No No No No  Type of AParamedicof AIlionLiving will        Copy of HLearyin Chart? No - copy requested        Would patient like information on creating a medical advance directive?   No - Patient declined No - Patient declined No - Patient declined No - Patient declined No - Patient declined    Current Medications (verified) Outpatient Encounter Medications as of 04/27/2022  Medication  Sig   azelastine (ASTELIN) 0.1 % nasal spray Place 1 spray into both nostrils 2 (two) times daily. Use in each nostril as directed   benazepril (LOTENSIN) 40 MG tablet TAKE 1 TABLET(40 MG) BY MOUTH AT BEDTIME   busPIRone (BUSPAR) 5 MG tablet Take 1 tablet (5 mg total) by mouth 3 (three) times daily as needed.   gabapentin (NEURONTIN) 100 MG capsule TAKE 1 CAPSULE BY MOUTH THREE TIMES DAILY   pramipexole (MIRAPEX) 0.5 MG tablet TAKE 1 TABLET(0.5 MG) BY MOUTH AT BEDTIME   pravastatin (PRAVACHOL) 40 MG tablet TAKE 1 TABLET BY MOUTH DAILY   spironolactone (ALDACTONE) 25 MG tablet Take 1 tablet (25 mg total) by mouth at bedtime.   [DISCONTINUED] spironolactone (ALDACTONE) 25 MG tablet TAKE 1/2 TABLET(12.5 MG) BY MOUTH AT BEDTIME   No facility-administered encounter medications on file as of 04/27/2022.    Allergies (verified) Fish allergy, Orange juice [orange oil], Strawberry extract, Vicodin [hydrocodone-acetaminophen], Erythromycin, Amlodipine, Fexofenadine, Amoxicillin, and Hydrochlorothiazide   History: Past Medical History:  Diagnosis Date   Allergy    Arthritis    Blood transfusion    Hyperlipidemia    Hypertension    Scoliosis    Past Surgical History:  Procedure Laterality Date   ABDOMINAL HYSTERECTOMY     APPENDECTOMY     CESAREAN SECTION     JOINT REPLACEMENT     Family History  Problem Relation Age of Onset   Kidney  disease Mother    Cancer Mother        Kidney   Heart disease Father    Cancer Brother        Colon   Diabetes Son        T1DM   Rosacea Son    Social History   Socioeconomic History   Marital status: Married    Spouse name: John   Number of children: 3   Years of education: 12   Highest education level: Not on file  Occupational History   Occupation: Retired- Statistician  Tobacco Use   Smoking status: Never   Smokeless tobacco: Never  Vaping Use   Vaping Use: Never used  Substance and Sexual Activity   Alcohol use: No   Drug use: No    Sexual activity: Not Currently  Other Topics Concern   Not on file  Social History Narrative   Health Care POA:    Emergency Contact: husband, Jenny Reichmann (c) [phone] or son, Randall Hiss (c) [phone]   End of Life Plan: Pt reports giving living will copy to Dr. Andria Frames   Who lives with you: husband and son   Any pets: none   Diet: Pt have a varied diet of protein, starch, fruits and vegetables.   Exercise: Pt exercises at [facility] 5-6 times a week. Pt reports participating in chair yoga, low impact aerobic classes, and swimming.   Seatbelts: Pt reports wearing seatbelt when in vehicles.    [place] Exposure/Protection: Pt reports wearing sunscreen and hat when outside.   Hobbies: reading and walking at the beach         Current Social History 11/02/2016                                                                                                     Social Determinants of Health   Financial Resource Strain: Low Risk  (04/27/2022)   Overall Financial Resource Strain (CARDIA)    Difficulty of Paying Living Expenses: Not hard at all  Food Insecurity: No Food Insecurity (04/27/2022)   Hunger Vital Sign    Worried About Running Out of Food in the Last Year: Never true    Ran Out of Food in the Last Year: Never true  Transportation Needs: No Transportation Needs (04/27/2022)   PRAPARE - Hydrologist (Medical): No    Lack of Transportation (Non-Medical): No  Physical Activity: Sufficiently Active (04/27/2022)   Exercise Vital Sign    Days of Exercise per Week: 5 days    Minutes of Exercise per Session: 60 min  Stress: No Stress Concern Present (04/27/2022)   [place]    Feeling of Stress : Not at all  Social Connections: Moderately Isolated (04/27/2022)   Social Connection and Isolation Panel [NHANES]    Frequency of Communication with Friends and Family: More than three times a week    Frequency of Social  Gatherings with Friends and Family: More than three times a week    Attends Religious Services: Never  Active Member of Clubs or Organizations: Yes    Attends Archivist Meetings: More than 4 times per year    Marital Status: Widowed    Tobacco Counseling Counseling given: Not Answered   Clinical Intake:  Pre-visit preparation completed: No  Pain : No/denies pain     BMI - recorded: 27.46 Nutritional Status: BMI 25 -29 Overweight Nutritional Risks: None Diabetes: No  How often do you need to have someone help you when you read instructions, pamphlets, or other written materials from your doctor or pharmacy?: 1 - Never  Diabetic?  No  Interpreter Needed?: No  Information entered by :: Rolene Arbour LPN   Activities of Daily Living    04/27/2022   11:14 AM  In your present state of health, do you have any difficulty performing the following activities:  Hearing? 0  Vision? 0  Difficulty concentrating or making decisions? 0  Walking or climbing stairs? 1  Comment Uses a cane  Dressing or bathing? 0  Doing errands, shopping? 0  Preparing Food and eating ? N  Using the Toilet? N  In the past six months, have you accidently leaked urine? Y  Comment Wears breifs. Followed b PCP  Do you have problems with loss of bowel control? N  Managing your Medications? N  Managing your Finances? N  Housekeeping or managing your Housekeeping? N    Patient Care Team: McDiarmid, Blane Ohara, MD as PCP - General (Family Medicine) Joycie Peek, MD (Dental General Practice) Antionette Fairy, Isaias Cowman, MD as Referring Physician (Ophthalmology)  Indicate any recent Medical Services you may have received from other than [facility] providers in the past year (date may be approximate).     Assessment:   This is a routine wellness examination for Donovan.  Hearing/Vision screen Hearing Screening - Comments:: Denies hearing difficulties   Vision Screening - Comments:: Wears rx glasses  - up to date with routine eye exams with  [place] issues and exercise activities discussed: Current Exercise Habits: Home exercise routine, Type of exercise: walking, Time (Minutes): 60, Frequency (Times/Week): 5, Weekly Exercise (Minutes/Week): 300, Intensity: Moderate, Exercise limited by: None identified   Goals Addressed               This Visit's Progress     Stay Healthy (pt-stated)        Just keep going.       Depression Screen    04/27/2022   11:13 AM 04/27/2022   11:12 AM 09/03/2021   10:30 AM 04/08/2021    3:42 PM 02/18/2021   11:07 AM 07/02/2020    1:55 PM 01/24/2020   11:48 AM  PHQ 2/9 Scores  PHQ - 2 Score 0 0 0 0 0 0 0  PHQ- 9 Score   0 0 2 0 1    Fall Risk    04/27/2022   11:16 AM 07/02/2020    1:56 PM 01/24/2020   11:48 AM 11/26/2019    8:53 AM 07/20/2019    3:58 PM  Fall Risk   Falls in the past year? 0 0 0 0 0  Number falls in past yr: 0 0 0  0  Injury with Fall? 0      Risk for fall due to : No Fall Risks      Follow up Falls prevention discussed  Falls evaluation completed  Falls evaluation completed    [place]:  Any stairs in or around  the home? No  If so, are there any without handrails? No  Home free of loose throw rugs in walkways, pet beds, electrical cords, etc? Yes  Adequate lighting in your home to reduce risk of falls? Yes   ASSISTIVE DEVICES UTILIZED TO PREVENT FALLS:  Life alert? Yes  Use of a cane, walker or w/c? Yes  Grab bars in the bathroom? Yes  Shower chair or bench in shower? Yes  Elevated toilet seat or a handicapped toilet? No   TIMED UP AND GO:  Was the test performed? No . Audio Visit   Cognitive Function:    08/07/2013   10:00 AM 12/23/2010   11:00 AM  MMSE - Mini Mental State Exam  Orientation to time 5 5  Orientation to Place 3 5  Registration 3 3  Attention/ Calculation 5 5  Recall 3 2  Language- name 2 objects 2 2  Language- repeat 1 1   Language- follow 3 step command 3 3  Language- read & follow direction 1 1  Write a sentence 1 1  Copy design 1 1  Total score 28 29        04/27/2022   11:17 AM  6CIT Screen  What Year? 0 points  What month? 0 points  What time? 0 points  Count back from 20 0 points  Months in reverse 0 points  Repeat phrase 0 points  Total Score 0 points    Immunizations Immunization History  Administered Date(s) Administered   Fluad Quad(high Dose 65+) 12/10/2021   Influenza Split 12/11/2010, 12/22/2011   Influenza,inj,Quad PF,6+ Mos 11/17/2012, 11/21/2013, 11/28/2014, 11/05/2015, 12/09/2016, 11/26/2019   Influenza-Unspecified 12/10/2020   Moderna Sars-Covid-2 Vaccination 04/05/2019, 04/30/2019   PFIZER Comirnaty(Gray Top)Covid-19 Tri-Sucrose Vaccine 01/08/2020, 08/19/2020   PPD Test 01/03/2019   Pneumococcal Conjugate-13 11/21/2013   Pneumococcal Polysaccharide-23 11/17/2012   Tdap 01/10/2013    TDAP status: Up to date  Flu Vaccine status: Up to date  Pneumococcal vaccine status: Up to date  Covid-19 vaccine status: Completed vaccines  Qualifies for Shingles Vaccine? Yes   Zostavax completed No   Shingrix Completed?: No.    Education has been provided regarding the importance of this vaccine. Patient has been advised to call insurance company to determine out of pocket expense if they have not yet received this vaccine. Advised may also receive vaccine at local pharmacy or Health Dept. Verbalized acceptance and understanding.  Screening Tests Health Maintenance  Topic Date Due   COVID-19 Vaccine (5 - 2023-24 season) 05/13/2022 (Originally 10/16/2021)   Zoster Vaccines- Shingrix (1 of 2) 07/28/2022 (Originally 11/03/1950)   DTaP/Tdap/Td (2 - Td or Tdap) 01/11/2023   Medicare Annual Wellness (AWV)  04/27/2023   Pneumonia Vaccine 60+ Years old  Completed   INFLUENZA VACCINE  Completed   DEXA SCAN  Completed   HPV VACCINES  Aged Out    Health Maintenance  There are no  preventive care reminders to display for this patient.   Colorectal cancer screening: No longer required.   Mammogram status: No longer required due to Age.  Bone Density status: Completed 11/24/12. Results reflect: Bone density results: OSTEOPOROSIS. Repeat every 0 years.  Lung Cancer Screening: (Low Dose CT Chest recommended if Age 60-80 years, 30 pack-year currently smoking OR have quit w/in 15years.) does not qualify.    Additional Screening:  Hepatitis C Screening: does not qualify; Completed   Vision Screening: Recommended annual ophthalmology exams for early detection of glaucoma and other disorders of the eye. Is the  patient up to date with their annual eye exam?  Yes  Who is the provider or what is the name of the office in which the patient attends annual eye exams? [facility] If pt is not established with a provider, would they like to be referred to a provider to establish care? No .   Dental Screening: Recommended annual dental exams for proper oral hygiene  Community Resource Referral / Chronic Care Management:  CRR required this visit?  No   CCM required this visit?  No      Plan:     I have personally reviewed and noted the following in the patient's chart:   Medical and social history Use of alcohol, tobacco or illicit drugs  Current medications and supplements including opioid prescriptions. Patient is not currently taking opioid prescriptions. Functional ability and status Nutritional status Physical activity Advanced directives List of other physicians Hospitalizations, surgeries, and ER visits in previous 12 months Vitals Screenings to include cognitive, depression, and falls Referrals and appointments  In addition, I have reviewed and discussed with patient certain preventive protocols, quality metrics, and best practice recommendations. A written personalized care plan for preventive services as well as general preventive health  recommendations were provided to patient.     Criselda Peaches, LPN   [id number]   Nurse Notes: None

## 2022-04-27 NOTE — Patient Instructions (Addendum)
Gina Bishop , Thank you for taking time to come for your Medicare Wellness Visit. I appreciate your ongoing commitment to your health goals. Please review the following plan we discussed and let me know if I can assist you in the future.   These are the goals we discussed:  Goals       Blood Pressure < 140/90      Increase water intake      Maintain current level of physical activity      [place] and chair yoga 3-4 X per week 45 minutes per time      Stay Healthy (pt-stated)      Just keep going.        This is a list of the screening recommended for you and due dates:  Health Maintenance  Topic Date Due   COVID-19 Vaccine (5 - 2023-24 season) 05/13/2022*   Zoster (Shingles) Vaccine (1 of 2) 07/28/2022*   DTaP/Tdap/Td vaccine (2 - Td or Tdap) 01/11/2023   Medicare Annual Wellness Visit  04/27/2023   Pneumonia Vaccine  Completed   Flu Shot  Completed   DEXA scan (bone density measurement)  Completed   HPV Vaccine  Aged Out  *Topic was postponed. The date shown is not the original due date.    Advanced directives: Please bring a copy of your health care power of attorney and living will to the office to be added to your chart at your convenience.   Conditions/risks identified: None  Next appointment: Follow up in one year for your annual wellness visit    Preventive Care 65 Years and Older, Female Preventive care refers to lifestyle choices and visits with your health care provider that can promote health and wellness. What does preventive care include? A yearly physical exam. This is also called an annual well check. Dental exams once or twice a year. Routine eye exams. Ask your health care provider how often you should have your eyes checked. Personal lifestyle choices, including: Daily care of your teeth and gums. Regular physical activity. Eating a healthy diet. Avoiding tobacco and drug use. Limiting alcohol use. Practicing safe sex. Taking low-dose aspirin every  day. Taking vitamin and mineral supplements as recommended by your health care provider. What happens during an annual well check? The services and screenings done by your health care provider during your annual well check will depend on your age, overall health, lifestyle risk factors, and family history of disease. Counseling  Your health care provider may ask you questions about your: Alcohol use. Tobacco use. Drug use. Emotional well-being. Home and relationship well-being. Sexual activity. Eating habits. History of falls. Memory and ability to understand (cognition). Work and work Statistician. Reproductive health. Screening  You may have the following tests or measurements: Height, weight, and BMI. Blood pressure. Lipid and cholesterol levels. These may be checked every 5 years, or more frequently if you are over 69 years old. Skin check. Lung cancer screening. You may have this screening every year starting at age 20 if you have a 30-pack-year history of smoking and currently smoke or have quit within the past 15 years. Fecal occult blood test (FOBT) of the stool. You may have this test every year starting at age 39. Flexible sigmoidoscopy or colonoscopy. You may have a sigmoidoscopy every 5 years or a colonoscopy every 10 years starting at age 6. Hepatitis C blood test. Hepatitis B blood test. Sexually transmitted disease (STD) testing. Diabetes screening. This is done by checking your blood sugar (  glucose) after you have not eaten for a while (fasting). You may have this done every 1-3 years. Bone density scan. This is done to screen for osteoporosis. You may have this done starting at age 85. Mammogram. This may be done every 1-2 years. Talk to your health care provider about how often you should have regular mammograms. Talk with your health care provider about your test results, treatment options, and if necessary, the need for more tests. Vaccines  Your health care  provider may recommend certain vaccines, such as: Influenza vaccine. This is recommended every year. Tetanus, diphtheria, and acellular pertussis (Tdap, Td) vaccine. You may need a Td booster every 10 years. Zoster vaccine. You may need this after age 56. Pneumococcal 13-valent conjugate (PCV13) vaccine. One dose is recommended after age 40. Pneumococcal polysaccharide (PPSV23) vaccine. One dose is recommended after age 44. Talk to your health care provider about which screenings and vaccines you need and how often you need them. This information is not intended to replace advice given to you by your health care provider. Make sure you discuss any questions you have with your health care provider. Document Released: 02/28/2015 Document Revised: 10/22/2015 Document Reviewed: 12/03/2014 Elsevier Interactive Patient Education  2017 [employer] Prevention in the Home Falls can cause injuries. They can happen to people of all ages. There are many things you can do to make your home safe and to help prevent falls. What can I do on the outside of my home? Regularly fix the edges of walkways and driveways and fix any cracks. Remove anything that might make you trip as you walk through a door, such as a raised step or threshold. Trim any bushes or trees on the path to your home. Use bright outdoor lighting. Clear any walking paths of anything that might make someone trip, such as rocks or tools. Regularly check to see if handrails are loose or broken. Make sure that both sides of any steps have handrails. Any raised decks and porches should have guardrails on the edges. Have any leaves, snow, or ice cleared regularly. Use sand or salt on walking paths during winter. Clean up any spills in your garage right away. This includes oil or grease spills. What can I do in the bathroom? Use night lights. Install grab bars by the toilet and in the tub and shower. Do not use towel bars as grab  bars. Use non-skid mats or decals in the tub or shower. If you need to sit down in the shower, use a plastic, non-slip stool. Keep the floor dry. Clean up any water that spills on the floor as soon as it happens. Remove soap buildup in the tub or shower regularly. Attach bath mats securely with double-sided non-slip rug tape. Do not have throw rugs and other things on the floor that can make you trip. What can I do in the bedroom? Use night lights. Make sure that you have a light by your bed that is easy to reach. Do not use any sheets or blankets that are too big for your bed. They should not hang down onto the floor. Have a firm chair that has side arms. You can use this for support while you get dressed. Do not have throw rugs and other things on the floor that can make you trip. What can I do in the kitchen? Clean up any spills right away. Avoid walking on wet floors. Keep items that you use a lot in easy-to-reach places.  If you need to reach something above you, use a strong step stool that has a grab bar. Keep electrical cords out of the way. Do not use floor polish or wax that makes floors slippery. If you must use wax, use non-skid floor wax. Do not have throw rugs and other things on the floor that can make you trip. What can I do with my stairs? Do not leave any items on the stairs. Make sure that there are handrails on both sides of the stairs and use them. Fix handrails that are broken or loose. Make sure that handrails are as long as the stairways. Check any carpeting to make sure that it is firmly attached to the stairs. Fix any carpet that is loose or worn. Avoid having throw rugs at the top or bottom of the stairs. If you do have throw rugs, attach them to the floor with carpet tape. Make sure that you have a light switch at the top of the stairs and the bottom of the stairs. If you do not have them, ask someone to add them for you. What else can I do to help prevent  falls? Wear shoes that: Do not have high heels. Have rubber bottoms. Are comfortable and fit you well. Are closed at the toe. Do not wear sandals. If you use a stepladder: Make sure that it is fully opened. Do not climb a closed stepladder. Make sure that both sides of the stepladder are locked into place. Ask someone to hold it for you, if possible. Clearly mark and make sure that you can see: Any grab bars or handrails. First and last steps. Where the edge of each step is. Use tools that help you move around (mobility aids) if they are needed. These include: Canes. Walkers. Scooters. Crutches. Turn on the lights when you go into a dark area. Replace any light bulbs as soon as they burn out. Set up your furniture so you have a clear path. Avoid moving your furniture around. If any of your floors are uneven, fix them. If there are any pets around you, be aware of where they are. Review your medicines with your doctor. Some medicines can make you feel dizzy. This can increase your chance of falling. Ask your doctor what other things that you can do to help prevent falls. This information is not intended to replace advice given to you by your health care provider. Make sure you discuss any questions you have with your health care provider. Document Released: 11/28/2008 Document Revised: 07/10/2015 Document Reviewed: 03/08/2014 Elsevier Interactive Patient Education  2017 [employer].

## 2022-04-29 DIAGNOSIS — I10 Essential (primary) hypertension: Secondary | ICD-10-CM | POA: Diagnosis not present

## 2022-04-29 DIAGNOSIS — N390 Urinary tract infection, site not specified: Secondary | ICD-10-CM | POA: Diagnosis not present

## 2022-04-29 DIAGNOSIS — N3001 Acute cystitis with hematuria: Secondary | ICD-10-CM | POA: Diagnosis not present

## 2022-05-03 DIAGNOSIS — L600 Ingrowing nail: Secondary | ICD-10-CM | POA: Diagnosis not present

## 2022-05-03 DIAGNOSIS — M79672 Pain in left foot: Secondary | ICD-10-CM | POA: Diagnosis not present

## 2022-05-03 DIAGNOSIS — M79671 Pain in right foot: Secondary | ICD-10-CM | POA: Diagnosis not present

## 2022-05-03 DIAGNOSIS — B351 Tinea unguium: Secondary | ICD-10-CM | POA: Diagnosis not present

## 2022-05-09 ENCOUNTER — Other Ambulatory Visit: Payer: Self-pay | Admitting: Family Medicine

## 2022-05-09 DIAGNOSIS — I10 Essential (primary) hypertension: Secondary | ICD-10-CM

## 2022-05-10 NOTE — Telephone Encounter (Signed)
[facility] before further refills  

## 2022-05-11 ENCOUNTER — Emergency Department (HOSPITAL_COMMUNITY): Payer: Medicare Other

## 2022-05-11 ENCOUNTER — Encounter (HOSPITAL_COMMUNITY)
Admission: EM | Disposition: A | Discharge status: Skilled Nursing Facility | Payer: Self-pay | Source: Skilled Nursing Facility | Attending: Family Medicine

## 2022-05-11 ENCOUNTER — Encounter (HOSPITAL_COMMUNITY): Payer: Self-pay | Admitting: Emergency Medicine

## 2022-05-11 ENCOUNTER — Inpatient Hospital Stay (HOSPITAL_COMMUNITY): Payer: Medicare Other

## 2022-05-11 ENCOUNTER — Inpatient Hospital Stay (HOSPITAL_COMMUNITY): Payer: Medicare Other | Admitting: Anesthesiology

## 2022-05-11 ENCOUNTER — Other Ambulatory Visit (HOSPITAL_COMMUNITY): Payer: Medicare Other

## 2022-05-11 ENCOUNTER — Other Ambulatory Visit: Payer: Self-pay

## 2022-05-11 ENCOUNTER — Inpatient Hospital Stay (HOSPITAL_COMMUNITY)
Admission: EM | Admit: 2022-05-11 | Discharge: 2022-05-14 | DRG: 481 | Disposition: A | Discharge status: Skilled Nursing Facility | Payer: Medicare Other | Source: Skilled Nursing Facility | Attending: Family Medicine | Admitting: Family Medicine

## 2022-05-11 DIAGNOSIS — Z888 Allergy status to other drugs, medicaments and biological substances status: Secondary | ICD-10-CM

## 2022-05-11 DIAGNOSIS — W19XXXA Unspecified fall, initial encounter: Secondary | ICD-10-CM | POA: Diagnosis not present

## 2022-05-11 DIAGNOSIS — Z880 Allergy status to penicillin: Secondary | ICD-10-CM | POA: Diagnosis not present

## 2022-05-11 DIAGNOSIS — Y92000 Kitchen of unspecified non-institutional (private) residence as  the place of occurrence of the external cause: Secondary | ICD-10-CM

## 2022-05-11 DIAGNOSIS — I10 Essential (primary) hypertension: Secondary | ICD-10-CM

## 2022-05-11 DIAGNOSIS — W010XXA Fall on same level from slipping, tripping and stumbling without subsequent striking against object, initial encounter: Secondary | ICD-10-CM | POA: Diagnosis present

## 2022-05-11 DIAGNOSIS — Z66 Do not resuscitate: Secondary | ICD-10-CM | POA: Diagnosis present

## 2022-05-11 DIAGNOSIS — G8911 Acute pain due to trauma: Secondary | ICD-10-CM | POA: Diagnosis not present

## 2022-05-11 DIAGNOSIS — Z91018 Allergy to other foods: Secondary | ICD-10-CM

## 2022-05-11 DIAGNOSIS — Z91013 Allergy to seafood: Secondary | ICD-10-CM | POA: Diagnosis not present

## 2022-05-11 DIAGNOSIS — R2689 Other abnormalities of gait and mobility: Secondary | ICD-10-CM | POA: Diagnosis not present

## 2022-05-11 DIAGNOSIS — E871 Hypo-osmolality and hyponatremia: Secondary | ICD-10-CM | POA: Diagnosis present

## 2022-05-11 DIAGNOSIS — Z885 Allergy status to narcotic agent status: Secondary | ICD-10-CM

## 2022-05-11 DIAGNOSIS — R609 Edema, unspecified: Secondary | ICD-10-CM | POA: Diagnosis not present

## 2022-05-11 DIAGNOSIS — S72002A Fracture of unspecified part of neck of left femur, initial encounter for closed fracture: Secondary | ICD-10-CM | POA: Diagnosis not present

## 2022-05-11 DIAGNOSIS — Z800 Family history of malignant neoplasm of digestive organs: Secondary | ICD-10-CM | POA: Diagnosis not present

## 2022-05-11 DIAGNOSIS — E785 Hyperlipidemia, unspecified: Secondary | ICD-10-CM | POA: Diagnosis not present

## 2022-05-11 DIAGNOSIS — Z833 Family history of diabetes mellitus: Secondary | ICD-10-CM

## 2022-05-11 DIAGNOSIS — M1990 Unspecified osteoarthritis, unspecified site: Secondary | ICD-10-CM

## 2022-05-11 DIAGNOSIS — M5030 Other cervical disc degeneration, unspecified cervical region: Secondary | ICD-10-CM | POA: Diagnosis not present

## 2022-05-11 DIAGNOSIS — M419 Scoliosis, unspecified: Secondary | ICD-10-CM

## 2022-05-11 DIAGNOSIS — R079 Chest pain, unspecified: Secondary | ICD-10-CM | POA: Diagnosis not present

## 2022-05-11 DIAGNOSIS — R739 Hyperglycemia, unspecified: Secondary | ICD-10-CM | POA: Diagnosis present

## 2022-05-11 DIAGNOSIS — K5900 Constipation, unspecified: Secondary | ICD-10-CM | POA: Diagnosis present

## 2022-05-11 DIAGNOSIS — Z841 Family history of disorders of kidney and ureter: Secondary | ICD-10-CM | POA: Diagnosis not present

## 2022-05-11 DIAGNOSIS — S72142D Displaced intertrochanteric fracture of left femur, subsequent encounter for closed fracture with routine healing: Secondary | ICD-10-CM | POA: Diagnosis not present

## 2022-05-11 DIAGNOSIS — M7989 Other specified soft tissue disorders: Secondary | ICD-10-CM | POA: Diagnosis not present

## 2022-05-11 DIAGNOSIS — S0990XA Unspecified injury of head, initial encounter: Secondary | ICD-10-CM | POA: Diagnosis not present

## 2022-05-11 DIAGNOSIS — S72142A Displaced intertrochanteric fracture of left femur, initial encounter for closed fracture: Secondary | ICD-10-CM

## 2022-05-11 DIAGNOSIS — Z4789 Encounter for other orthopedic aftercare: Secondary | ICD-10-CM | POA: Diagnosis not present

## 2022-05-11 DIAGNOSIS — Z79899 Other long term (current) drug therapy: Secondary | ICD-10-CM

## 2022-05-11 DIAGNOSIS — Z8249 Family history of ischemic heart disease and other diseases of the circulatory system: Secondary | ICD-10-CM | POA: Diagnosis not present

## 2022-05-11 DIAGNOSIS — M109 Gout, unspecified: Secondary | ICD-10-CM | POA: Diagnosis not present

## 2022-05-11 DIAGNOSIS — M79652 Pain in left thigh: Secondary | ICD-10-CM | POA: Diagnosis not present

## 2022-05-11 DIAGNOSIS — D62 Acute posthemorrhagic anemia: Secondary | ICD-10-CM | POA: Diagnosis present

## 2022-05-11 DIAGNOSIS — D696 Thrombocytopenia, unspecified: Secondary | ICD-10-CM | POA: Insufficient documentation

## 2022-05-11 DIAGNOSIS — M25552 Pain in left hip: Secondary | ICD-10-CM | POA: Diagnosis not present

## 2022-05-11 DIAGNOSIS — Z8051 Family history of malignant neoplasm of kidney: Secondary | ICD-10-CM | POA: Diagnosis not present

## 2022-05-11 DIAGNOSIS — G2581 Restless legs syndrome: Secondary | ICD-10-CM | POA: Diagnosis present

## 2022-05-11 DIAGNOSIS — N183 Chronic kidney disease, stage 3 unspecified: Secondary | ICD-10-CM | POA: Diagnosis not present

## 2022-05-11 DIAGNOSIS — F419 Anxiety disorder, unspecified: Secondary | ICD-10-CM | POA: Diagnosis present

## 2022-05-11 DIAGNOSIS — E7800 Pure hypercholesterolemia, unspecified: Secondary | ICD-10-CM | POA: Diagnosis present

## 2022-05-11 DIAGNOSIS — Z7401 Bed confinement status: Secondary | ICD-10-CM | POA: Diagnosis not present

## 2022-05-11 DIAGNOSIS — Z881 Allergy status to other antibiotic agents status: Secondary | ICD-10-CM | POA: Diagnosis not present

## 2022-05-11 DIAGNOSIS — I6529 Occlusion and stenosis of unspecified carotid artery: Secondary | ICD-10-CM | POA: Diagnosis not present

## 2022-05-11 HISTORY — PX: INTRAMEDULLARY (IM) NAIL INTERTROCHANTERIC: SHX5875

## 2022-05-11 LAB — CBC WITH DIFFERENTIAL/PLATELET
Abs Immature Granulocytes: 0.08 10*3/uL — ABNORMAL HIGH (ref 0.00–0.07)
Basophils Absolute: 0.1 10*3/uL (ref 0.0–0.1)
Basophils Relative: 0 %
Eosinophils Absolute: 0 10*3/uL (ref 0.0–0.5)
Eosinophils Relative: 0 %
HCT: 42.9 % (ref 36.0–46.0)
Hemoglobin: 14.5 g/dL (ref 12.0–15.0)
Immature Granulocytes: 1 %
Lymphocytes Relative: 4 %
Lymphs Abs: 0.7 10*3/uL (ref 0.7–4.0)
MCH: 30.6 pg (ref 26.0–34.0)
MCHC: 33.8 g/dL (ref 30.0–36.0)
MCV: 90.5 fL (ref 80.0–100.0)
Monocytes Absolute: 0.7 10*3/uL (ref 0.1–1.0)
Monocytes Relative: 5 %
Neutro Abs: 13.9 10*3/uL — ABNORMAL HIGH (ref 1.7–7.7)
Neutrophils Relative %: 90 %
Platelets: 208 10*3/uL (ref 150–400)
RBC: 4.74 MIL/uL (ref 3.87–5.11)
RDW: 13.1 % (ref 11.5–15.5)
WBC: 15.4 10*3/uL — ABNORMAL HIGH (ref 4.0–10.5)
nRBC: 0 % (ref 0.0–0.2)

## 2022-05-11 LAB — BASIC METABOLIC PANEL
Anion gap: 10 (ref 5–15)
BUN: 20 mg/dL (ref 8–23)
CO2: 24 mmol/L (ref 22–32)
Calcium: 9.3 mg/dL (ref 8.9–10.3)
Chloride: 99 mmol/L (ref 98–111)
Creatinine, Ser: 0.81 mg/dL (ref 0.44–1.00)
GFR, Estimated: >60 mL/min (ref >60)
Glucose, Bld: 123 mg/dL — ABNORMAL HIGH (ref 70–99)
Potassium: 4.5 mmol/L (ref 3.5–5.1)
Sodium: 133 mmol/L — ABNORMAL LOW (ref 135–145)

## 2022-05-11 LAB — PROTIME-INR
INR: 1 (ref 0.8–1.2)
Prothrombin Time: 13.4 seconds (ref 11.4–15.2)

## 2022-05-11 LAB — TYPE AND SCREEN
ABO/RH(D): O POS
Antibody Screen: NEGATIVE

## 2022-05-11 LAB — SURGICAL PCR SCREEN
MRSA, PCR: NEGATIVE
Staphylococcus aureus: NEGATIVE

## 2022-05-11 SURGERY — FIXATION, FRACTURE, INTERTROCHANTERIC, WITH INTRAMEDULLARY ROD
Anesthesia: Spinal | Site: Hip | Laterality: Left

## 2022-05-11 MED ORDER — MORPHINE SULFATE (PF) 2 MG/ML IV SOLN
0.5000 mg | INTRAVENOUS | Status: DC | PRN
  Administered 2022-05-11: 1 mg via INTRAVENOUS
  Filled 2022-05-11: qty 1

## 2022-05-11 MED ORDER — ONDANSETRON HCL 4 MG/2ML IJ SOLN: 4.0000 mg | Freq: Once | INTRAMUSCULAR | Status: DC | PRN

## 2022-05-11 MED ORDER — POVIDONE-IODINE 10 % EX SWAB: 2.0000 | Freq: Once | CUTANEOUS | Status: DC

## 2022-05-11 MED ORDER — ONDANSETRON HCL 4 MG/2ML IJ SOLN
4.0000 mg | Freq: Once | INTRAMUSCULAR | Status: AC
  Administered 2022-05-11: 4 mg via INTRAVENOUS
  Filled 2022-05-11: qty 2

## 2022-05-11 MED ORDER — GABAPENTIN 100 MG PO CAPS
100.0000 mg | ORAL_CAPSULE | Freq: Two times a day (BID) | ORAL | Status: DC
  Administered 2022-05-11 – 2022-05-14 (×6): 100 mg via ORAL
  Filled 2022-05-11 (×7): qty 1

## 2022-05-11 MED ORDER — DOCUSATE SODIUM 100 MG PO CAPS
100.0000 mg | ORAL_CAPSULE | Freq: Two times a day (BID) | ORAL | Status: DC
  Administered 2022-05-12 – 2022-05-14 (×4): 100 mg via ORAL
  Filled 2022-05-11 (×5): qty 1

## 2022-05-11 MED ORDER — TRANEXAMIC ACID-NACL 1000-0.7 MG/100ML-% IV SOLN
1000.0000 mg | INTRAVENOUS | Status: AC
  Administered 2022-05-11: 1000 mg via INTRAVENOUS
  Filled 2022-05-11: qty 100

## 2022-05-11 MED ORDER — MORPHINE SULFATE (PF) 4 MG/ML IV SOLN
4.0000 mg | Freq: Once | INTRAVENOUS | Status: AC
  Administered 2022-05-11: 4 mg via INTRAVENOUS
  Filled 2022-05-11: qty 1

## 2022-05-11 MED ORDER — SODIUM CHLORIDE 0.9 % IV SOLN: INTRAVENOUS | Status: DC

## 2022-05-11 MED ORDER — CEFAZOLIN SODIUM-DEXTROSE 2-4 GM/100ML-% IV SOLN
2.0000 g | INTRAVENOUS | Status: AC
  Administered 2022-05-11: 2 g via INTRAVENOUS
  Filled 2022-05-11: qty 100

## 2022-05-11 MED ORDER — ACETAMINOPHEN 500 MG PO TABS
500.0000 mg | ORAL_TABLET | Freq: Four times a day (QID) | ORAL | Status: AC
  Administered 2022-05-11 – 2022-05-12 (×4): 500 mg via ORAL
  Filled 2022-05-11 (×4): qty 1

## 2022-05-11 MED ORDER — LACTATED RINGERS IV SOLN: INTRAVENOUS | Status: DC

## 2022-05-11 MED ORDER — TRANEXAMIC ACID-NACL 1000-0.7 MG/100ML-% IV SOLN
1000.0000 mg | Freq: Once | INTRAVENOUS | Status: AC
  Administered 2022-05-11: 1000 mg via INTRAVENOUS
  Filled 2022-05-11: qty 100

## 2022-05-11 MED ORDER — PRAMIPEXOLE DIHYDROCHLORIDE 0.25 MG PO TABS
0.5000 mg | ORAL_TABLET | Freq: Once | ORAL | Status: AC
  Administered 2022-05-11: 0.5 mg via ORAL
  Filled 2022-05-11: qty 2

## 2022-05-11 MED ORDER — ASPIRIN 325 MG PO TBEC
325.0000 mg | DELAYED_RELEASE_TABLET | Freq: Every day | ORAL | Status: DC
  Administered 2022-05-12 – 2022-05-14 (×3): 325 mg via ORAL
  Filled 2022-05-11 (×3): qty 1

## 2022-05-11 MED ORDER — METOCLOPRAMIDE HCL 5 MG PO TABS: 5.0000 mg | ORAL_TABLET | Freq: Three times a day (TID) | ORAL | Status: DC | PRN

## 2022-05-11 MED ORDER — ONDANSETRON HCL 4 MG PO TABS: 4.0000 mg | ORAL_TABLET | Freq: Four times a day (QID) | ORAL | Status: DC | PRN

## 2022-05-11 MED ORDER — STERILE WATER FOR IRRIGATION IR SOLN
Status: DC | PRN
  Administered 2022-05-11: 2000 mL

## 2022-05-11 MED ORDER — BUPIVACAINE IN DEXTROSE 0.75-8.25 % IT SOLN
INTRATHECAL | Status: DC | PRN
  Administered 2022-05-11: 1.6 mL via INTRATHECAL

## 2022-05-11 MED ORDER — PHENOL 1.4 % MT LIQD: 1.0000 | OROMUCOSAL | Status: DC | PRN

## 2022-05-11 MED ORDER — FENTANYL CITRATE (PF) 100 MCG/2ML IJ SOLN
INTRAMUSCULAR | Status: DC | PRN
  Administered 2022-05-11: 50 ug via INTRAVENOUS

## 2022-05-11 MED ORDER — BUSPIRONE HCL 5 MG PO TABS
5.0000 mg | ORAL_TABLET | Freq: Three times a day (TID) | ORAL | Status: DC | PRN
  Administered 2022-05-13: 5 mg via ORAL
  Filled 2022-05-11: qty 1

## 2022-05-11 MED ORDER — CHLORHEXIDINE GLUCONATE 0.12 % MT SOLN
15.0000 mL | Freq: Once | OROMUCOSAL | Status: AC
  Administered 2022-05-11: 15 mL via OROMUCOSAL

## 2022-05-11 MED ORDER — FENTANYL CITRATE PF 50 MCG/ML IJ SOSY
50.0000 ug | PREFILLED_SYRINGE | INTRAMUSCULAR | Status: AC | PRN
  Administered 2022-05-11 (×2): 50 ug via INTRAVENOUS
  Filled 2022-05-11 (×2): qty 1

## 2022-05-11 MED ORDER — ACETAMINOPHEN 325 MG PO TABS: 650.0000 mg | ORAL_TABLET | Freq: Four times a day (QID) | ORAL | Status: DC | PRN

## 2022-05-11 MED ORDER — MUPIROCIN 2 % EX OINT: 1.0000 | TOPICAL_OINTMENT | Freq: Two times a day (BID) | CUTANEOUS | Status: DC

## 2022-05-11 MED ORDER — FENTANYL CITRATE PF 50 MCG/ML IJ SOSY: 25.0000 ug | PREFILLED_SYRINGE | INTRAMUSCULAR | Status: DC | PRN

## 2022-05-11 MED ORDER — PROPOFOL 1000 MG/100ML IV EMUL
INTRAVENOUS | Status: AC
  Filled 2022-05-11: qty 100

## 2022-05-11 MED ORDER — CHLORHEXIDINE GLUCONATE 4 % EX LIQD: 60.0000 mL | Freq: Once | CUTANEOUS | Status: DC

## 2022-05-11 MED ORDER — ONDANSETRON HCL 4 MG/2ML IJ SOLN
4.0000 mg | Freq: Four times a day (QID) | INTRAMUSCULAR | Status: DC | PRN
  Administered 2022-05-11: 4 mg via INTRAVENOUS
  Filled 2022-05-11: qty 2

## 2022-05-11 MED ORDER — PHENYLEPHRINE HCL (PRESSORS) 10 MG/ML IV SOLN
INTRAVENOUS | Status: AC
  Filled 2022-05-11: qty 1

## 2022-05-11 MED ORDER — PHENYLEPHRINE HCL-NACL 20-0.9 MG/250ML-% IV SOLN
INTRAVENOUS | Status: DC | PRN
  Administered 2022-05-11: 50 ug/min via INTRAVENOUS

## 2022-05-11 MED ORDER — PROPOFOL 500 MG/50ML IV EMUL
INTRAVENOUS | Status: DC | PRN
  Administered 2022-05-11: 50 ug/kg/min via INTRAVENOUS

## 2022-05-11 MED ORDER — ONDANSETRON HCL 4 MG/2ML IJ SOLN: 4.0000 mg | Freq: Four times a day (QID) | INTRAMUSCULAR | Status: DC | PRN

## 2022-05-11 MED ORDER — 0.9 % SODIUM CHLORIDE (POUR BTL) OPTIME
TOPICAL | Status: DC | PRN
  Administered 2022-05-11: 1000 mL

## 2022-05-11 MED ORDER — MENTHOL 3 MG MT LOZG: 1.0000 | LOZENGE | OROMUCOSAL | Status: DC | PRN

## 2022-05-11 MED ORDER — CEFAZOLIN SODIUM-DEXTROSE 2-4 GM/100ML-% IV SOLN
2.0000 g | Freq: Four times a day (QID) | INTRAVENOUS | Status: AC
  Administered 2022-05-11 – 2022-05-12 (×2): 2 g via INTRAVENOUS
  Filled 2022-05-11 (×2): qty 100

## 2022-05-11 MED ORDER — FENTANYL CITRATE (PF) 100 MCG/2ML IJ SOLN
INTRAMUSCULAR | Status: AC
  Filled 2022-05-11: qty 2

## 2022-05-11 MED ORDER — TRAMADOL HCL 50 MG PO TABS
50.0000 mg | ORAL_TABLET | Freq: Four times a day (QID) | ORAL | Status: DC | PRN
  Administered 2022-05-11 – 2022-05-14 (×6): 50 mg via ORAL
  Filled 2022-05-11 (×7): qty 1

## 2022-05-11 MED ORDER — METOCLOPRAMIDE HCL 5 MG/ML IJ SOLN: 5.0000 mg | Freq: Three times a day (TID) | INTRAMUSCULAR | Status: DC | PRN

## 2022-05-11 MED ORDER — METHOCARBAMOL 1000 MG/10ML IJ SOLN: 500.0000 mg | Freq: Four times a day (QID) | INTRAVENOUS | Status: DC | PRN

## 2022-05-11 MED ORDER — ACETAMINOPHEN 650 MG RE SUPP: 650.0000 mg | Freq: Four times a day (QID) | RECTAL | Status: DC | PRN

## 2022-05-11 MED ORDER — PROPOFOL 10 MG/ML IV BOLUS
INTRAVENOUS | Status: DC | PRN
  Administered 2022-05-11: 20 mg via INTRAVENOUS
  Administered 2022-05-11: 35 mg via INTRAVENOUS

## 2022-05-11 MED ORDER — PRAMIPEXOLE DIHYDROCHLORIDE 0.25 MG PO TABS
0.5000 mg | ORAL_TABLET | Freq: Every day | ORAL | Status: DC
  Administered 2022-05-11 – 2022-05-13 (×3): 0.5 mg via ORAL
  Filled 2022-05-11 (×4): qty 2

## 2022-05-11 MED ORDER — SODIUM CHLORIDE 0.9 % IV SOLN: Freq: Once | INTRAVENOUS | Status: AC

## 2022-05-11 MED ORDER — METHOCARBAMOL 500 MG PO TABS
500.0000 mg | ORAL_TABLET | Freq: Four times a day (QID) | ORAL | Status: DC | PRN
  Administered 2022-05-12 – 2022-05-13 (×4): 500 mg via ORAL
  Filled 2022-05-11 (×5): qty 1

## 2022-05-11 MED ORDER — HYDROMORPHONE HCL 1 MG/ML IJ SOLN
0.5000 mg | INTRAMUSCULAR | Status: DC | PRN
  Administered 2022-05-12: 0.5 mg via INTRAVENOUS
  Filled 2022-05-11: qty 0.5

## 2022-05-11 SURGICAL SUPPLY — 46 items
APL PRP STRL LF DISP 70% ISPRP (MISCELLANEOUS) ×1
BAG COUNTER SPONGE SURGICOUNT (BAG) IMPLANT
BAG SPEC THK2 15X12 ZIP CLS (MISCELLANEOUS) ×1
BAG SPNG CNTER NS LX DISP (BAG)
BAG ZIPLOCK 12X15 (MISCELLANEOUS) ×1 IMPLANT
BIT DRILL 4.3MMS DISTAL GRDTED (BIT) IMPLANT
BIT DRILL LAG SCREW (DRILL) IMPLANT
BNDG GAUZE DERMACEA FLUFF 4 (GAUZE/BANDAGES/DRESSINGS) ×1 IMPLANT
BNDG GZE DERMACEA 4 6PLY (GAUZE/BANDAGES/DRESSINGS) ×1
CHLORAPREP W/TINT 26 (MISCELLANEOUS) ×1 IMPLANT
COVER MAYO STAND REUSABLE (DRAPES) ×1 IMPLANT
COVER PERINEAL POST (MISCELLANEOUS) ×1 IMPLANT
COVER SURGICAL LIGHT HANDLE (MISCELLANEOUS) ×1 IMPLANT
DRAPE INCISE IOBAN 66X45 STRL (DRAPES) ×1 IMPLANT
DRESSING MEPILEX FLEX 4X4 (GAUZE/BANDAGES/DRESSINGS) ×3 IMPLANT
DRILL 4.3MMS DISTAL GRADUATED (BIT) ×1
DRILL LAG SCREW (DRILL) ×1
DRSG MEPILEX FLEX 4X4 (GAUZE/BANDAGES/DRESSINGS) ×1
DRSG MEPILEX POST OP 4X8 (GAUZE/BANDAGES/DRESSINGS) IMPLANT
ELECT REM PT RETURN 15FT ADLT (MISCELLANEOUS) ×1 IMPLANT
GLOVE BIO SURGEON STRL SZ7.5 (GLOVE) ×1 IMPLANT
GLOVE BIOGEL PI IND STRL 6.5 (GLOVE) ×1 IMPLANT
GLOVE BIOGEL PI IND STRL 8 (GLOVE) ×1 IMPLANT
GLOVE SURG POLYISO LF SZ6.5 (GLOVE) ×1 IMPLANT
GOWN STRL REUS W/ TWL XL LVL3 (GOWN DISPOSABLE) ×1 IMPLANT
GOWN STRL REUS W/TWL XL LVL3 (GOWN DISPOSABLE) ×1
GUIDEPIN VERSANAIL DSP 3.2X444 (ORTHOPEDIC DISPOSABLE SUPPLIES) IMPLANT
GUIDEWIRE BALL NOSE 80CM (WIRE) IMPLANT
HIP FR NAIL LAG SCREW 10.5X110 (Orthopedic Implant) ×1 IMPLANT
HIP FRAC NAIL LEFT 11X360MM (Orthopedic Implant) ×1 IMPLANT
KIT BASIN OR (CUSTOM PROCEDURE TRAY) ×1 IMPLANT
KIT TURNOVER KIT A (KITS) IMPLANT
MANIFOLD NEPTUNE II (INSTRUMENTS) ×1 IMPLANT
NAIL HIP FRAC LEFT 11X360MM (Orthopedic Implant) IMPLANT
NS IRRIG 1000ML POUR BTL (IV SOLUTION) ×1 IMPLANT
PACK GENERAL/GYN (CUSTOM PROCEDURE TRAY) ×1 IMPLANT
PROTECTOR NERVE ULNAR (MISCELLANEOUS) ×1 IMPLANT
SCREW BONE CORTICAL 5.0X36 (Screw) IMPLANT
SCREW LAG HIP FR NAIL 10.5X110 (Orthopedic Implant) IMPLANT
STAPLER VISISTAT 35W (STAPLE) ×1 IMPLANT
SUT VIC AB 0 CT1 36 (SUTURE) ×1 IMPLANT
SUT VIC AB 2-0 CT1 27 (SUTURE) ×1
SUT VIC AB 2-0 CT1 27XBRD (SUTURE) ×1 IMPLANT
TOWEL OR 17X26 10 PK STRL BLUE (TOWEL DISPOSABLE) ×2 IMPLANT
TRAY FOLEY MTR SLVR 16FR STAT (SET/KITS/TRAYS/PACK) ×1 IMPLANT
WATER STERILE IRR 1000ML POUR (IV SOLUTION) ×2 IMPLANT

## 2022-05-11 NOTE — Op Note (Signed)
Procedure(s): INTRAMEDULLARY (IM) NAIL INTERTROCHANTERIC Procedure Note  Gina Bishop female 89 y.o. 05/11/2022  Preoperative diagnosis: Left hip comminuted intertrochanteric proximal femur fracture  Postoperative diagnosis: Same  Procedure(s) and Anesthesia Type:    * INTRAMEDULLARY (IM) NAIL INTERTROCHANTERIC - Spinal  Surgeon(s) and Role:    Tania Ade, MD - Primary   Indications:  89 y.o. female s/p fall with left hip fracture. Indicated for surgery to promote early ambulation, pain control and prevent complications of bed rest.     Surgeon: Rhae Hammock   Assistants: Sheryle Hail PA-C [name] was present and scrubbed throughout the procedure and was essential in positioning, retraction, exposure, and closure)  Anesthesia: Spinal anesthesia    Procedure Detail  INTRAMEDULLARY (IM) NAIL INTERTROCHANTERIC  Findings: Biomet affixes nail, 11 x 360.  Significant fracture comminution  Estimated Blood Loss:  200 mL         Drains: none  Blood Given: none          Specimens: none        Complications:  * No complications entered in OR log *         Disposition: PACU - hemodynamically stable.         Condition: stable    Procedure:  The patient was identified in the preoperative holding area  where I personally marked the operative site after verifying site, side,  and procedure with the patient. She was taken back to the operating  room where general anesthesia was induced without complication. She was  placed on the fracture table with the left lower extremity in traction,  and opposite lower extremity in a flexed abducted position. The arms were well  padded. Fluoroscopic imaging was used to verify reduction with gentle traction  and internal rotation. The left hip was then prepped and draped in the standard sterile fashion. An approximately 3 cm incision was made proximal  to the palpable greater trochanter tip. Dissection was carried down to   the tip and the short guidewire was placed under fluoroscopic imaging.  The proximal entry reamer was used to open the canal and the ball-tipped  guidewire was then placed and advanced down the femoral canal.  The 11 x 360 mm nail was then  advanced over the guidewire without difficulty and the proximal jig was  then placed and a small 1.5 cm incision was made on the lateral thigh to  advance the lag screw guide against the lateral aspect of the femur.  There was noted to be significant comminution and including the proximal lateral trochanter making it difficult to control the position of the nail proximally The guide pin was advanced and its position was verified in AP and  lateral planes to be centered in the head.  The guidewire was over  reamed and the appropriate size lag screw was advanced. The  proximal set screw was then advanced, backed off a quarter turn to allow  sliding. AP and lateral imaging demonstrated appropriate position of  the screw and reduction of the fracture. The proximal jig was then  removed. Attention was turned to the knee. Where using perfect  circles technique on the lateral x-ray, one distal interlocking screw  was placed from lateral to medial through a percutaneous incision.  Final fluoroscopic imaging in AP and lateral planes at the hip and the  knee demonstrated near anatomic reduction with appropriate length and  position of the hardware. All wounds were then copiously irrigated with  normal saline and  subsequently closed in layers with #1 Vicryl in a deep  fascia layer, 2-0 Vicryl in a deep dermal layer, and staples for skin  closure. Sterile dressings were then applied including 4x4s and Mepilex  dressings. The patient was then taken off the fracture table,  transferred to the stretcher, and taken to the recovery room in stable  condition after she was extubated.   POSTOPERATIVE PLAN: She will be weightbearing as tolerated on the  operative extremity. She will have DVT prophylaxis of aspirin, SCDs and early mobilization

## 2022-05-11 NOTE — H&P (Signed)
History and Physical    Patient: Gina Bishop [id number] DOB: 1931/03/02 DOA: 05/11/2022 DOS: the patient was seen and examined on 05/11/2022 PCP: McDiarmid, Blane Ohara, MD  Patient coming from: Home  Chief Complaint:  Chief Complaint  Patient presents with   Fall   HPI: Gina Bishop is a 89 y.o. female with medical history significant of seasonal allergies, osteoarthritis, hyperlipidemia, hypertension, scoliosis, essential tremor who was brought to to the emergency department via EMS from [facility] independent living after she lost her footing, fell in the kitchen hitting her left hip area which unfortunately resulted in left hip fracture.  She was unable to bear weight on her LLE.  She denied fever, chills, rhinorrhea, sore throat, wheezing or hemoptysis.  No chest pain, palpitations, diaphoresis, PND, orthopnea or pitting edema of the lower extremities.  No abdominal pain, nausea, emesis, diarrhea, constipation, melena or hematochezia.  No flank pain, dysuria, frequency or hematuria.  No polyuria, polydipsia, polyphagia or blurred vision.   Lab work: Her CBC is her white count of 15.4 and with 90% neutrophils, hemoglobin 14.5 g/dL platelets 208.  PT 13.4 and INR 1.0.  BMP with a glucose of 123 mg/dL and sodium of 133 mmol/L, the other values were normal.  Imaging: Portable 1 view chest radiograph with central vascular congestion without evidence of edema.  Mild cardiomegaly.  Left femur x-ray with an acute comminuted intertrochanteric left hip fracture, with impaction as well as Barrus and ventral angulation of the fracture.  No dislocation.  CT head without contrast with no acute intracranial findings.  There was small vessel disease and atrophy.  ED course: Initial vital signs were temperature 97.9 F, pulse 73, respiration 14, BP 189/97 mmHg O2 sat 94% on room air.  The patient received fentanyl 50 mcg IVP x 2, ondansetron 4 mg IVP x 1 and morphine 4 mg IVP x 1.   Review of Systems: As  mentioned in the history of present illness. All other systems reviewed and are negative. Past Medical History:  Diagnosis Date   Allergy    Arthritis    Blood transfusion    Hyperlipidemia    Hypertension    Scoliosis    Past Surgical History:  Procedure Laterality Date   ABDOMINAL HYSTERECTOMY     APPENDECTOMY     CESAREAN SECTION     JOINT REPLACEMENT     Social History:  reports that she has never smoked. She has never used smokeless tobacco. She reports that she does not drink alcohol and does not use drugs.  Allergies  Allergen Reactions   Fish Allergy    Orange Juice [Orange Oil]    Strawberry Extract    Vicodin [Hydrocodone-Acetaminophen] Other (See Comments)    hallucinatations and nightmares   Erythromycin Diarrhea   Amlodipine     Leg swelling   Fexofenadine     Sedated in the 180 mg dose.   Amoxicillin Nausea And Vomiting   Hydrochlorothiazide Other (See Comments)    Hyponatremia while on HCTZ    Family History  Problem Relation Age of Onset   Kidney disease Mother    Cancer Mother        Kidney   Heart disease Father    Cancer Brother        Colon   Diabetes Son        T1DM   Rosacea Son     Prior to Admission medications   Medication Sig Start Date End Date Taking? Authorizing Provider  azelastine (  ASTELIN) 0.1 % nasal spray Place 1 spray into both nostrils 2 (two) times daily. Use in each nostril as directed 07/23/21   [name], MD  benazepril (LOTENSIN) 40 MG tablet TAKE 1 TABLET(40 MG) BY MOUTH AT BEDTIME 06/16/21   Hensel, Jamal Collin, MD  busPIRone (BUSPAR) 5 MG tablet Take 1 tablet (5 mg total) by mouth 3 (three) times daily as needed. 04/08/21   [name], MD  gabapentin (NEURONTIN) 100 MG capsule TAKE 1 CAPSULE BY MOUTH THREE TIMES DAILY 04/06/21   [name], MD  pramipexole (MIRAPEX) 0.5 MG tablet TAKE 1 TABLET(0.5 MG) BY MOUTH AT BEDTIME 07/29/21   [name], MD  pravastatin (PRAVACHOL) 40 MG tablet TAKE 1 TABLET  BY MOUTH DAILY 01/25/22   [name], MD  spironolactone (ALDACTONE) 25 MG tablet TAKE 1 TABLET(25 MG) BY MOUTH AT BEDTIME 05/10/22   McDiarmid, Blane Ohara, MD  spironolactone (ALDACTONE) 25 MG tablet TAKE 1/2 TABLET(12.5 MG) BY MOUTH AT BEDTIME 09/21/21   [name], MD    Physical Exam: Vitals:   05/11/22 0913 05/11/22 0921 05/11/22 1030  BP:  (!) 189/97 (!) 146/65  Pulse:  73 76  Resp:  14 18  Temp:  97.9 F (36.6 C)   TempSrc:  Oral   SpO2: 94% 97% 92%   Physical Exam Vitals reviewed.  HENT:     Head: Normocephalic.     Mouth/Throat:     Mouth: Mucous membranes are moist.  Eyes:     General: No scleral icterus.    Pupils: Pupils are equal, round, and reactive to light.  Cardiovascular:     Rate and Rhythm: Normal rate and regular rhythm.  Abdominal:     General: Bowel sounds are normal. There is no distension.     Palpations: Abdomen is soft.     Tenderness: There is no abdominal tenderness. There is no guarding.  Musculoskeletal:     Cervical back: Neck supple.     Right lower leg: No edema.     Left lower leg: No edema.  Skin:    General: Skin is warm and dry.  Neurological:     General: No focal deficit present.     Mental Status: She is alert and oriented to person, place, and time.  Psychiatric:        Mood and Affect: Mood normal.     Data Reviewed:  Results are pending, will review when available.  08/13/2020 Echocardiogram. IMPRESSIONS    1. Left ventricular ejection fraction, by estimation, is 40 to 45%. The  left ventricle has mildly decreased function. The left ventricle has no  regional wall motion abnormalities. There is mild asymmetric left  ventricular hypertrophy of the posterior  segment. Left ventricular diastolic parameters are indeterminate.   2. Right ventricular systolic function is normal. The right ventricular  size is normal. There is normal pulmonary artery systolic pressure.   3. The mitral valve is normal in structure.  Mild mitral valve  regurgitation. No evidence of mitral stenosis.   4. The aortic valve has an indeterminant number of cusps. There is mild  calcification of the aortic valve. Aortic valve regurgitation is mild.  Mild aortic valve sclerosis is present, with no evidence of aortic valve  stenosis.   5. The inferior vena cava is normal in size with greater than 50%  respiratory variability, suggesting right atrial pressure of 3 mmHg.   EKG: Vent. rate 76 BPM PR interval 198 ms  QRS duration 102 ms QT/QTcB 408/459 ms P-R-T axes 32 5 36 Sinus rhythm Probable left atrial enlargement  Assessment and Plan: Principal Problem:   Closed left hip fracture, initial encounter ([place]) Admit to telemetry/inpatient. Ice area as needed. Buck's traction per protocol. Analgesics as needed. Antiemetics as needed. Consult TOC team. Consult nutritional services. PT evaluation after surgery. Orthopedic surgery evaluation appreciated.  Active Problems:   Hypertension Holding benazepril/spironolactone for now. Monitor blood pressure and resume postoperatively.    Hypercholesteremia Resume pravastatin in AM.    Restless leg syndrome Continue pramipexole 0.5 mg p.o. nightly.    Hyponatremia Minimal.  In the setting of diuretic use. Monitor sodium level.    Hyperglycemia Nonfasting level. Check fasting glucose in AM. Further workup depending on results.    Anxiety Continue buspirone 5 mg p.o. 3 times daily as needed.     Advance Care Planning:   Code Status: DNR   Consults: Orthopedic surgery (Dr. Tamera Punt).  Family Communication: Her son was at bedside.  Severity of Illness: The appropriate patient status for this patient is INPATIENT. Inpatient status is judged to be reasonable and necessary in order to provide the required intensity of service to ensure the patient's safety. The patient's presenting symptoms, physical exam findings, and initial radiographic and laboratory data in  the context of their chronic comorbidities is felt to place them at high risk for further clinical deterioration. Furthermore, it is not anticipated that the patient will be medically stable for discharge from the hospital within 2 midnights of admission.   * I certify that at the point of admission it is my clinical judgment that the patient will require inpatient hospital care spanning beyond 2 midnights from the point of admission due to high intensity of service, high risk for further deterioration and high frequency of surveillance required.*  Author: Reubin Milan, MD 05/11/2022 12:13 PM  For on call review [email or web address].   This document was prepared using Dragon voice recognition software and may contain some unintended transcription errors.

## 2022-05-11 NOTE — ED Triage Notes (Addendum)
Pt coming from [facility] independent living with c/o fall this morning. Per pt, she lost her footing and fell twisting her left knee. Patient denies any blood thinners, head trauma, or LOC. Shortening and rotation noted to left leg. Pain on palpation distal to hip.

## 2022-05-11 NOTE — Anesthesia Preprocedure Evaluation (Addendum)
Anesthesia Evaluation  Patient identified by MRN, date of birth, ID band Patient awake    Reviewed: Allergy & Precautions, NPO status , Patient's Chart, lab work & pertinent test results, reviewed documented beta blocker date and time   Airway Mallampati: II  TM Distance: >3 FB Neck ROM: Full    Dental no notable dental hx. (+) Dental Advisory Given   Pulmonary neg pulmonary ROS   Pulmonary exam normal breath sounds clear to auscultation       Cardiovascular hypertension, Pt. on medications Normal cardiovascular exam Rhythm:Regular Rate:Normal  EKG 05/11/22 NSR, LAE  Echo 08/13/20 1. Left ventricular ejection fraction, by estimation, is 40 to 45%. The  left ventricle has mildly decreased function. The left ventricle has no  regional wall motion abnormalities. There is mild asymmetric left  ventricular hypertrophy of the posterior  segment. Left ventricular diastolic parameters are indeterminate.   2. Right ventricular systolic function is normal. The right ventricular  size is normal. There is normal pulmonary artery systolic pressure.   3. The mitral valve is normal in structure. Mild mitral valve  regurgitation. No evidence of mitral stenosis.   4. The aortic valve has an indeterminant number of cusps. There is mild  calcification of the aortic valve. Aortic valve regurgitation is mild.  Mild aortic valve sclerosis is present, with no evidence of aortic valve  stenosis.   5. The inferior vena cava is normal in size with greater than 50%  respiratory variability, suggesting right atrial pressure of 3 mmHg.      Neuro/Psych  PSYCHIATRIC DISORDERS      negative neurological ROS     GI/Hepatic negative GI ROS, Neg liver ROS,,,  Endo/Other  Hyperlipidemia  Renal/GU negative Renal ROS Bladder dysfunction  Incontinence    Musculoskeletal  (+) Arthritis , Osteoarthritis,  Scoliosis Intertroachanteric left hip Fx    Abdominal   Peds  Hematology negative hematology ROS (+)   Anesthesia Other Findings   Reproductive/Obstetrics                              Anesthesia Physical Anesthesia Plan  ASA: 3  Anesthesia Plan: Spinal   Post-op Pain Management: Minimal or no pain anticipated and Regional block*   Induction: Intravenous  PONV Risk Score and Plan: 3 and Treatment may vary due to age or medical condition and Propofol infusion  Airway Management Planned: Natural Airway and Simple Face Mask  Additional Equipment: None  Intra-op Plan:   Post-operative Plan: Extubation in OR  Informed Consent: I have reviewed the patients History and Physical, chart, labs and discussed the procedure including the risks, benefits and alternatives for the proposed anesthesia with the patient or authorized representative who has indicated his/her understanding and acceptance.   Patient has DNR.  Discussed DNR with patient and Suspend DNR.   Dental advisory given  Plan Discussed with: CRNA and Anesthesiologist  Anesthesia Plan Comments:          Anesthesia Quick Evaluation

## 2022-05-11 NOTE — Consult Note (Signed)
Reason for Consult: Left hip fracture Referring Physician: EDP  Gina Bishop is an 89 y.o. female.  HPI: 89 year old female status post fall earlier today after losing her balance.  She fell onto her left hip.  Immediate complaint of hip and knee pain with difficulty getting up.  She was found to have a comminuted intertrochanteric proximal femur fracture in the ER and I was consulted for evaluation and management.  Past Medical History:  Diagnosis Date   Allergy    Arthritis    Blood transfusion    Hyperlipidemia    Hypertension    Scoliosis     Past Surgical History:  Procedure Laterality Date   ABDOMINAL HYSTERECTOMY     APPENDECTOMY     CESAREAN SECTION     JOINT REPLACEMENT      Family History  Problem Relation Age of Onset   Kidney disease Mother    Cancer Mother        Kidney   Heart disease Father    Cancer Brother        Colon   Diabetes Son        T1DM   Rosacea Son     Social History:  reports that she has never smoked. She has never used smokeless tobacco. She reports that she does not drink alcohol and does not use drugs.  Allergies:  Allergies  Allergen Reactions   Fish Allergy    Orange Juice [Orange Oil]    Strawberry Extract    Vicodin [Hydrocodone-Acetaminophen] Other (See Comments)    hallucinatations and nightmares   Erythromycin Diarrhea   Amlodipine     Leg swelling   Fexofenadine     Sedated in the 180 mg dose.   Amoxicillin Nausea And Vomiting   Hydrochlorothiazide Other (See Comments)    Hyponatremia while on HCTZ    Medications: I have reviewed the patient's current medications.  Results for orders placed or performed during the hospital encounter of 05/11/22 (from the past 48 hour(s))  Basic metabolic panel     Status: Abnormal   Collection Time: 05/11/22  9:57 AM  Result Value Ref Range   Sodium 133 (L) 135 - 145 mmol/L   Potassium 4.5 3.5 - 5.1 mmol/L   Chloride 99 98 - 111 mmol/L   CO2 24 22 - 32 mmol/L   Glucose, Bld  123 (H) 70 - 99 mg/dL    Comment: Glucose reference range applies only to samples taken after fasting for at least 8 hours.   BUN 20 8 - 23 mg/dL   Creatinine, Ser 0.81 0.44 - 1.00 mg/dL   Calcium 9.3 8.9 - 10.3 mg/dL   GFR, Estimated >60 >60 mL/min    Comment: (NOTE) Calculated using the CKD-EPI Creatinine Equation (2021)    Anion gap 10 5 - 15    Comment: Performed at [facility], [place] [address], [place], [place] [address]  CBC with Differential     Status: Abnormal   Collection Time: 05/11/22  9:57 AM  Result Value Ref Range   WBC 15.4 (H) 4.0 - 10.5 K/uL   RBC 4.74 3.87 - 5.11 MIL/uL   Hemoglobin 14.5 12.0 - 15.0 g/dL   HCT 42.9 36.0 - 46.0 %   MCV 90.5 80.0 - 100.0 fL   MCH 30.6 26.0 - 34.0 pg   MCHC 33.8 30.0 - 36.0 g/dL   RDW 13.1 11.5 - 15.5 %   Platelets 208 150 - 400 K/uL   nRBC 0.0 0.0 -  0.2 %   Neutrophils Relative % 90 %   Neutro Abs 13.9 (H) 1.7 - 7.7 K/uL   Lymphocytes Relative 4 %   Lymphs Abs 0.7 0.7 - 4.0 K/uL   Monocytes Relative 5 %   Monocytes Absolute 0.7 0.1 - 1.0 K/uL   Eosinophils Relative 0 %   Eosinophils Absolute 0.0 0.0 - 0.5 K/uL   Basophils Relative 0 %   Basophils Absolute 0.1 0.0 - 0.1 K/uL   Immature Granulocytes 1 %   Abs Immature Granulocytes 0.08 (H) 0.00 - 0.07 K/uL    Comment: Performed at [facility], [place] [address] [place], [place] [address]  Protime-INR     Status: None   Collection Time: 05/11/22  9:57 AM  Result Value Ref Range   Prothrombin Time 13.4 11.4 - 15.2 seconds   INR 1.0 0.8 - 1.2    Comment: (NOTE) INR goal varies based on device and disease states. Performed at [facility], [place] [address] [place], [place] [address]   Type and screen [place]     Status: None   Collection Time: 05/11/22  9:57 AM  Result Value Ref Range   ABO/RH(D) O POS    Antibody Screen NEG    Sample Expiration      05/14/2022,2359 Performed at [facility], [place] [address], [place], [place] [address]   Surgical PCR screen     Status: None   Collection Time: 05/11/22  2:17 PM   Specimen: Nasal Mucosa; Nasal Swab  Result Value Ref Range   MRSA, PCR NEGATIVE NEGATIVE   Staphylococcus aureus NEGATIVE NEGATIVE    Comment: (NOTE) The Xpert SA Assay (FDA approved for NASAL specimens in patients 82 years of age and older), is one component of a comprehensive surveillance program. It is not intended to diagnose infection nor to guide or monitor treatment. Performed at [facility], [place] [address] [place], [place] [address]     CT HEAD WO CONTRAST  Result Date: 05/11/2022 CLINICAL DATA:  Trauma, fall EXAM: CT HEAD WITHOUT CONTRAST TECHNIQUE: Contiguous axial images were obtained from the base of the skull through the vertex without intravenous contrast. RADIATION DOSE REDUCTION: This exam was performed according to the departmental dose-optimization program which includes automated exposure control, adjustment of the mA and/or kV according to patient size and/or use of iterative reconstruction technique. COMPARISON:  Images of previous study done on 08/02/2015 not available for review. Report for the previous study was reviewed. FINDINGS: Brain: No acute intracranial findings are seen. There are no signs of bleeding within the cranium. Cortical sulci are prominent. There is decreased density in periventricular and subcortical white matter. Vascular: Scattered arterial calcifications are seen. Skull: No acute findings are seen. Sinuses/Orbits: There are no air-fluid levels in paranasal sinuses. Other: None. IMPRESSION: No acute intracranial findings are seen. Atrophy. Small vessel disease. Electronically Signed   By: Elmer Picker M.D.   On: 05/11/2022 11:10   DG FEMUR MIN 2 VIEWS LEFT  Result Date: 05/11/2022 CLINICAL DATA:  [place], left femur pain EXAM: LEFT FEMUR 2 VIEWS COMPARISON:  None Available. FINDINGS:  Frontal and cross-table lateral views of the left femur are obtained. There is a comminuted intertrochanteric left hip fracture, with mild impaction as well as ventral and varus angulation at the fracture site. No dislocation. There is a medial compartmental left knee arthroplasty. Cannulated screws and washers traverse the tibial plateau. Prior healed left superior and inferior  pubic rami fractures are noted. There is soft tissue swelling throughout the left thigh. IMPRESSION: 1. Acute comminuted intertrochanteric left hip fracture, with impaction as well as varus and ventral angulation at the fracture site. No dislocation. 2. Unremarkable medial compartmental left knee partial arthroplasty. 3. Chronic healed left superior and inferior pubic rami fractures. 4. Postsurgical changes at the left tibial plateau as above. Electronically Signed   By: Randa Ngo M.D.   On: 05/11/2022 10:49   DG Chest 1 View  Result Date: 05/11/2022 CLINICAL DATA:  [place], left femur pain EXAM: CHEST  1 VIEW COMPARISON:  11/15/2018 FINDINGS: Single frontal view of the chest demonstrates mild enlargement the cardiac silhouette, not appreciably changed since prior exam. Central vascular congestion without airspace disease, effusion, or pneumothorax. No acute bony abnormalities. IMPRESSION: 1. Central vascular congestion without evidence of edema. 2. Mild enlargement the cardiac silhouette. Electronically Signed   By: Randa Ngo M.D.   On: 05/11/2022 10:47    Review of Systems  All other systems reviewed and are negative.  Blood pressure (!) 144/89, pulse 87, temperature 98.1 F (36.7 C), temperature source Oral, resp. rate 16, height 5\' 5"  (1.651 m), weight 72.8 kg, SpO2 93 %. Physical Exam HENT:     Head: Atraumatic.  Eyes:     Extraocular Movements: Extraocular movements intact.  Cardiovascular:     Pulses: Normal pulses.  Pulmonary:     Effort: Pulmonary effort is normal.  Musculoskeletal:     Comments: Left  lower extremity shortened and externally rotated.  Distally neurovascularly intact.  Pain with any hip range of motion.  No tenderness about the knee or calf.  Neurological:     Mental Status: She is alert.  Psychiatric:        Mood and Affect: Mood normal.     Assessment/Plan: Left hip intertrochanteric proximal femur fracture Plan left hip intramedullary nail Risks / benefits of surgery discussed Consent on chart  NPO for OR Preop antibiotics   Rhae Hammock 05/11/2022, 4:32 PM

## 2022-05-11 NOTE — Transfer of Care (Signed)
Immediate Anesthesia Transfer of Care Note  Patient: Gina Bishop  Procedure(s) Performed: INTRAMEDULLARY (IM) NAIL INTERTROCHANTERIC (Left: Hip)  Patient Location: PACU  Anesthesia Type:Spinal and MAC combined with regional for post-op pain  Level of Consciousness: awake, alert , oriented, and patient cooperative  Airway & Oxygen Therapy: Patient Spontanous Breathing and Patient connected to face mask oxygen  Post-op Assessment: Report given to RN and Post -op Vital signs reviewed and stable  Post vital signs: Reviewed and stable  Last Vitals:  Vitals Value Taken Time  BP 107/61 05/11/22 1802  Temp    Pulse 50 05/11/22 1804  Resp 17 05/11/22 1804  SpO2 98 % 05/11/22 1804  Vitals shown include unvalidated device data.  Last Pain:  Vitals:   05/11/22 1509  TempSrc: Oral  PainSc: 7       Patients Stated Pain Goal: 0 ([id number])  Complications: No notable events documented.

## 2022-05-11 NOTE — Anesthesia Postprocedure Evaluation (Signed)
Anesthesia Post Note  Patient: Gina Bishop  Procedure(s) Performed: INTRAMEDULLARY (IM) NAIL INTERTROCHANTERIC (Left: Hip)     Patient location during evaluation: PACU Anesthesia Type: Spinal Level of consciousness: oriented and awake and alert Pain management: pain level controlled Vital Signs Assessment: post-procedure vital signs reviewed and stable Respiratory status: spontaneous breathing, respiratory function stable and nonlabored ventilation Cardiovascular status: blood pressure returned to baseline and stable Postop Assessment: no headache, no backache, no apparent nausea or vomiting, patient able to bend at knees and spinal receding Anesthetic complications: no   No notable events documented.  Last Vitals:  Vitals:   05/11/22 1845 05/11/22 1900  BP: 127/64 121/66  Pulse: 67   Resp: 16 (!) 21  Temp:    SpO2: 100%     Last Pain:  Vitals:   05/11/22 1845  TempSrc:   PainSc: 0-No pain                 Nathasha Fiorillo A.

## 2022-05-11 NOTE — ED Provider Notes (Signed)
[place] EMERGENCY DEPARTMENT AT [facility] Provider Note   CSN: [id number] Arrival date & time: 05/11/22  [id number]     History  Chief Complaint  Patient presents with   Gina Bishop is a 89 y.o. female.  She is brought in by ambulance after an unwitnessed fall at home.  She said she tripped and fell to the ground did not strike her head did not lose consciousness.  Complaining of severe left knee pain.  She normally walks unassisted although uses a cane for walking outside.  She is not on any blood thinners.  She denies any other complaints other than she has had a UTI for 2 weeks and has been seen in urgent care for this.  The history is provided by the patient and the EMS personnel.  Fall This is a new problem. The current episode started 1 to 2 hours ago. The problem has not changed since onset.Pertinent negatives include no chest pain, no abdominal pain, no headaches and no shortness of breath. The symptoms are aggravated by bending and twisting. Nothing relieves the symptoms. She has tried nothing for the symptoms. The treatment provided no relief.       Home Medications Prior to Admission medications   Medication Sig Start Date End Date Taking? Authorizing Provider  azelastine (ASTELIN) 0.1 % nasal spray Place 1 spray into both nostrils 2 (two) times daily. Use in each nostril as directed 07/23/21   [name], MD  benazepril (LOTENSIN) 40 MG tablet TAKE 1 TABLET(40 MG) BY MOUTH AT BEDTIME 06/16/21   Hensel, Jamal Collin, MD  busPIRone (BUSPAR) 5 MG tablet Take 1 tablet (5 mg total) by mouth 3 (three) times daily as needed. 04/08/21   [name], MD  gabapentin (NEURONTIN) 100 MG capsule TAKE 1 CAPSULE BY MOUTH THREE TIMES DAILY 04/06/21   [name], MD  pramipexole (MIRAPEX) 0.5 MG tablet TAKE 1 TABLET(0.5 MG) BY MOUTH AT BEDTIME 07/29/21   [name], MD  pravastatin (PRAVACHOL) 40 MG tablet TAKE 1 TABLET BY MOUTH DAILY 01/25/22   [name], MD  spironolactone (ALDACTONE) 25 MG tablet TAKE 1 TABLET(25 MG) BY MOUTH AT BEDTIME 05/10/22   McDiarmid, Blane Ohara, MD  spironolactone (ALDACTONE) 25 MG tablet TAKE 1/2 TABLET(12.5 MG) BY MOUTH AT BEDTIME 09/21/21   [name], MD      Allergies    Fish allergy, Orange juice [orange oil], Strawberry extract, Vicodin [hydrocodone-acetaminophen], Erythromycin, Amlodipine, Fexofenadine, Amoxicillin, and Hydrochlorothiazide    Review of Systems   Review of Systems  Constitutional:  Negative for fever.  HENT:  Negative for sore throat.   Eyes:  Negative for visual disturbance.  Respiratory:  Negative for shortness of breath.   Cardiovascular:  Negative for chest pain.  Gastrointestinal:  Negative for abdominal pain.  Genitourinary:  Positive for dysuria.  Musculoskeletal:  Negative for neck pain.  Skin:  Negative for rash.  Neurological:  Negative for headaches.    Physical Exam Updated Vital Signs BP (!) 144/89   Pulse 87   Temp 98.1 F (36.7 C) (Oral)   Resp 16   Ht 5\' 5"  (1.651 m)   Wt 72.8 kg   SpO2 93%   BMI 26.71 kg/m  Physical Exam Vitals and nursing note reviewed.  Constitutional:      General: She is not in acute distress.    Appearance: Normal appearance. She is well-developed.  HENT:     Head:  Normocephalic and atraumatic.  Eyes:     Conjunctiva/sclera: Conjunctivae normal.  Cardiovascular:     Rate and Rhythm: Normal rate and regular rhythm.     Heart sounds: No murmur heard. Pulmonary:     Effort: Pulmonary effort is normal. No respiratory distress.     Breath sounds: Normal breath sounds.  Abdominal:     Palpations: Abdomen is soft.     Tenderness: There is no abdominal tenderness. There is no guarding or rebound.  Musculoskeletal:        General: Tenderness and deformity present.     Cervical back: Neck supple.     Comments: Left lower extremity diffuse tenderness mid thigh to just above her knee.  Her left leg is shortened and externally  rotated.  Knee itself does not appear to be significantly tender and no tenderness of her lower leg ankle or foot.  Distal pulses motor and sensation intact.  Right lower extremity and bilateral upper extremities full range of motion without any pain or limitations.  Skin:    General: Skin is warm and dry.     Capillary Refill: Capillary refill takes less than 2 seconds.  Neurological:     General: No focal deficit present.     Mental Status: She is alert.     Cranial Nerves: No cranial nerve deficit.     Sensory: No sensory deficit.     Motor: No weakness.     ED Results / Procedures / Treatments   Labs (all labs ordered are listed, but only abnormal results are displayed) Labs Reviewed  BASIC METABOLIC PANEL - Abnormal; Notable for the following components:      Result Value   Sodium 133 (*)    Glucose, Bld 123 (*)    All other components within normal limits  CBC WITH DIFFERENTIAL/PLATELET - Abnormal; Notable for the following components:   WBC 15.4 (*)    Neutro Abs 13.9 (*)    Abs Immature Granulocytes 0.08 (*)    All other components within normal limits  SURGICAL PCR SCREEN  PROTIME-INR  URINALYSIS, ROUTINE W REFLEX MICROSCOPIC  CBC  COMPREHENSIVE METABOLIC PANEL  TYPE AND SCREEN    EKG EKG Interpretation  Date/Time:  Tuesday May 11 2022 09:33:29 EDT Ventricular Rate:  76 PR Interval:  198 QRS Duration: 102 QT Interval:  408 QTC Calculation: 459 R Axis:   5 Text Interpretation: Sinus rhythm Probable left atrial enlargement No significant change since prior 4/21 Confirmed by Aletta Edouard [phone]) on 05/11/2022 9:41:42 AM  Radiology CT HEAD WO CONTRAST  Result Date: 05/11/2022 CLINICAL DATA:  Trauma, fall EXAM: CT HEAD WITHOUT CONTRAST TECHNIQUE: Contiguous axial images were obtained from the base of the skull through the vertex without intravenous contrast. RADIATION DOSE REDUCTION: This exam was performed according to the departmental dose-optimization  program which includes automated exposure control, adjustment of the mA and/or kV according to patient size and/or use of iterative reconstruction technique. COMPARISON:  Images of previous study done on 08/02/2015 not available for review. Report for the previous study was reviewed. FINDINGS: Brain: No acute intracranial findings are seen. There are no signs of bleeding within the cranium. Cortical sulci are prominent. There is decreased density in periventricular and subcortical white matter. Vascular: Scattered arterial calcifications are seen. Skull: No acute findings are seen. Sinuses/Orbits: There are no air-fluid levels in paranasal sinuses. Other: None. IMPRESSION: No acute intracranial findings are seen. Atrophy. Small vessel disease. Electronically Signed   By: Prudy Feeler.D.  On: 05/11/2022 11:10   DG FEMUR MIN 2 VIEWS LEFT  Result Date: 05/11/2022 CLINICAL DATA:  [place], left femur pain EXAM: LEFT FEMUR 2 VIEWS COMPARISON:  None Available. FINDINGS: Frontal and cross-table lateral views of the left femur are obtained. There is a comminuted intertrochanteric left hip fracture, with mild impaction as well as ventral and varus angulation at the fracture site. No dislocation. There is a medial compartmental left knee arthroplasty. Cannulated screws and washers traverse the tibial plateau. Prior healed left superior and inferior pubic rami fractures are noted. There is soft tissue swelling throughout the left thigh. IMPRESSION: 1. Acute comminuted intertrochanteric left hip fracture, with impaction as well as varus and ventral angulation at the fracture site. No dislocation. 2. Unremarkable medial compartmental left knee partial arthroplasty. 3. Chronic healed left superior and inferior pubic rami fractures. 4. Postsurgical changes at the left tibial plateau as above. Electronically Signed   By: Randa Ngo M.D.   On: 05/11/2022 10:49   DG Chest 1 View  Result Date: 05/11/2022 CLINICAL  DATA:  [place], left femur pain EXAM: CHEST  1 VIEW COMPARISON:  11/15/2018 FINDINGS: Single frontal view of the chest demonstrates mild enlargement the cardiac silhouette, not appreciably changed since prior exam. Central vascular congestion without airspace disease, effusion, or pneumothorax. No acute bony abnormalities. IMPRESSION: 1. Central vascular congestion without evidence of edema. 2. Mild enlargement the cardiac silhouette. Electronically Signed   By: Randa Ngo M.D.   On: 05/11/2022 10:47    Procedures Procedures    Medications Ordered in ED Medications  0.9 %  sodium chloride infusion (0 mLs Intravenous Stopped 05/11/22 1210)  chlorhexidine (HIBICLENS) 4 % liquid 4 Application (has no administration in time range)  povidone-iodine 10 % swab 2 Application (2 Applications Topical Not Given 05/11/22 1516)  HYDROmorphone (DILAUDID) injection 0.5 mg ( Intravenous MAR Hold 05/11/22 1500)  acetaminophen (TYLENOL) tablet 650 mg ( Oral MAR Hold 05/11/22 1500)    Or  acetaminophen (TYLENOL) suppository 650 mg ( Rectal MAR Hold 05/11/22 1500)  ondansetron (ZOFRAN) tablet 4 mg ( Oral MAR Hold 05/11/22 1500)    Or  ondansetron (ZOFRAN) injection 4 mg ( Intravenous MAR Hold 05/11/22 1500)  mupirocin ointment (BACTROBAN) 2 % 1 Application ( Nasal Automatically Held 05/15/22 2200)  lactated ringers infusion ( Intravenous Restarted 05/11/22 1637)  busPIRone (BUSPAR) tablet 5 mg (has no administration in time range)  pramipexole (MIRAPEX) tablet 0.5 mg (has no administration in time range)  gabapentin (NEURONTIN) capsule 100 mg (has no administration in time range)  0.9 % irrigation (POUR BTL) (1,000 mLs Irrigation Given 05/11/22 1659)  sterile water for irrigation for irrigation (2,000 mLs  Given 05/11/22 1659)  fentaNYL (SUBLIMAZE) injection 50 mcg (50 mcg Intravenous Given 05/11/22 1138)  ondansetron (ZOFRAN) injection 4 mg (4 mg Intravenous Given 05/11/22 0949)  0.9 %  sodium chloride infusion (  Intravenous New Bag/Given 05/11/22 1210)  ceFAZolin (ANCEF) IVPB 2g/100 mL premix (2 g Intravenous Given 05/11/22 1643)  tranexamic acid (CYKLOKAPRON) IVPB 1,000 mg (1,000 mg Intravenous Given 05/11/22 1658)  morphine (PF) 4 MG/ML injection 4 mg (4 mg Intravenous Given 05/11/22 1216)  chlorhexidine (PERIDEX) 0.12 % solution 15 mL (15 mLs Mouth/Throat Given 05/11/22 1519)  pramipexole (MIRAPEX) tablet 0.5 mg (0.5 mg Oral Given 05/11/22 1623)    ED Course/ Medical Decision Making/ A&P Clinical Course as of 05/11/22 1713  Tue May 11, 2022  1101 Chest x-ray does not show any acute infiltrate.  Hip and femur x-ray  showing acute intertrochanteric fracture. [MB]  1130 I updated patient and son on results of imaging.  She was fine with me calling the on-call orthopedist here.  She is also asking for another dose of pain medicine from all the jostling with x-rays and CAT scan. [MB]  [id number] Discussed with Dr. Tamera Punt orthopedics.  He asked for a medicine admission and keep her n.p.o. and he may be able to operate this afternoon.  Hospitalist has been paged. [MB]    Clinical Course User Index [MB] Hayden Rasmussen, MD                             Medical Decision Making Amount and/or Complexity of Data Reviewed Labs: ordered. Radiology: ordered.  Risk Prescription drug management. Decision regarding hospitalization.   This patient complains of left hip and thigh pain after fall; this involves an extensive number of treatment Options and is a complaint that carries with it a high risk of complications and morbidity. The differential includes fracture, dislocation, contusion, hematoma  I ordered, reviewed and interpreted labs, which included CBC with elevated white count possibly reactive, normal hemoglobin, chemistries fairly unremarkable, INR normal I ordered medication IV pain medication and nausea medication and reviewed PMP when indicated. I ordered imaging studies which included CT head, x-rays  of pelvis left hip and chest and I independently    visualized and interpreted imaging which showed acute intertrochanteric fracture on left Additional history obtained from EMS and patient's son Previous records obtained and reviewed in epic no recent admissions I consulted orthopedics Dr. Tamera Punt and [facility] hospitalist Dr. Olevia Bowens and discussed lab and imaging findings and discussed disposition.  Cardiac monitoring reviewed, normal sinus rhythm Social determinants considered, no significant barriers Critical Interventions: None  After the interventions stated above, I reevaluated the patient and found patient to be neurovascularly intact and pain is controlled Admission and further testing considered, she will need admission to the hospital for further evaluation by orthopedics for likely operative repair.  Patient and son in agreement with plan for admission.         Final Clinical Impression(s) / ED Diagnoses Final diagnoses:  Fall, initial encounter  Closed intertrochanteric fracture of hip, left, initial encounter [facility])    Rx / DC Orders ED Discharge Orders     None         Hayden Rasmussen, MD 05/11/22 1715

## 2022-05-11 NOTE — Anesthesia Procedure Notes (Signed)
Spinal  Patient location during procedure: OR Start time: 05/11/2022 4:44 PM End time: 05/11/2022 4:48 PM Reason for block: surgical anesthesia Staffing Performed: anesthesiologist  Anesthesiologist: Josephine Igo, MD Performed by: Josephine Igo, MD Authorized by: Josephine Igo, MD   Preanesthetic Checklist Completed: patient identified, IV checked, site marked, risks and benefits discussed, surgical consent, monitors and equipment checked, pre-op evaluation and timeout performed Spinal Block Patient position: left lateral decubitus Prep: DuraPrep and site prepped and draped Patient monitoring: heart rate, cardiac monitor, continuous pulse ox and blood pressure Approach: midline Location: L3-4 Injection technique: single-shot Needle Needle type: Pencan  Needle gauge: 24 G Needle length: 9 cm Needle insertion depth: 7 cm Assessment Sensory level: T6 Events: CSF return Additional Notes Patient tolerated procedure well. Adequate sensory level.

## 2022-05-11 NOTE — Discharge Instructions (Signed)
Discharge Instructions after Hip Surgery  Weight bearing as tolerated Use ice on the hip intermittently after surgery.  Pain medicine has been prescribed for you.  Use your medicine liberally over the first 48 hours, and then you can begin to taper your use. You may take Extra Strength Tylenol or Tylenol only in place of the pain pills. DO NOT take ANY nonsteroidal anti-inflammatory pain medications: Advil, Motrin, Ibuprofen, Aleve, Naproxen or Naprosyn.  You may remove your dressing after two days and replace with new dressing. You may shower 5 days after surgery. The incisions CANNOT get wet prior to 5 days. Simply allow the water to wash over the site and then pat dry. Do not rub the incisions. Make sure your axilla (armpit) is completely dry after showering.  Take one aspirin 325mg  a day for 2 weeks after surgery, unless you have an aspirin sensitivity/ allergy or asthma.   Please call [phone] during normal business hours or [phone] after hours for any problems. Including the following:  - excessive redness of the incisions - drainage for more than 4 days - fever of more than 101.5 F  *Please note that pain medications will not be refilled after hours or on weekends.

## 2022-05-11 NOTE — Plan of Care (Signed)
  Problem: Education: Goal: Knowledge of General Education information will improve Description Including pain rating scale, medication(s)/side effects and non-pharmacologic comfort measures Outcome: Progressing   

## 2022-05-12 DIAGNOSIS — D696 Thrombocytopenia, unspecified: Secondary | ICD-10-CM | POA: Diagnosis not present

## 2022-05-12 DIAGNOSIS — D62 Acute posthemorrhagic anemia: Secondary | ICD-10-CM

## 2022-05-12 DIAGNOSIS — S72002A Fracture of unspecified part of neck of left femur, initial encounter for closed fracture: Secondary | ICD-10-CM | POA: Diagnosis not present

## 2022-05-12 LAB — ABO/RH: ABO/RH(D): O POS

## 2022-05-12 LAB — COMPREHENSIVE METABOLIC PANEL
ALT: 15 U/L (ref 0–44)
AST: 16 U/L (ref 15–41)
Albumin: 3.2 g/dL — ABNORMAL LOW (ref 3.5–5.0)
Alkaline Phosphatase: 51 U/L (ref 38–126)
Anion gap: 7 (ref 5–15)
BUN: 19 mg/dL (ref 8–23)
CO2: 21 mmol/L — ABNORMAL LOW (ref 22–32)
Calcium: 8.1 mg/dL — ABNORMAL LOW (ref 8.9–10.3)
Chloride: 102 mmol/L (ref 98–111)
Creatinine, Ser: 0.79 mg/dL (ref 0.44–1.00)
GFR, Estimated: >60 mL/min (ref >60)
Glucose, Bld: 124 mg/dL — ABNORMAL HIGH (ref 70–99)
Potassium: 4.5 mmol/L (ref 3.5–5.1)
Sodium: 130 mmol/L — ABNORMAL LOW (ref 135–145)
Total Bilirubin: 0.7 mg/dL (ref 0.3–1.2)
Total Protein: 5.3 g/dL — ABNORMAL LOW (ref 6.5–8.1)

## 2022-05-12 LAB — CBC
HCT: 31 % — ABNORMAL LOW (ref 36.0–46.0)
Hemoglobin: 10.2 g/dL — ABNORMAL LOW (ref 12.0–15.0)
MCH: 30.4 pg (ref 26.0–34.0)
MCHC: 32.9 g/dL (ref 30.0–36.0)
MCV: 92.3 fL (ref 80.0–100.0)
Platelets: 145 10*3/uL — ABNORMAL LOW (ref 150–400)
RBC: 3.36 MIL/uL — ABNORMAL LOW (ref 3.87–5.11)
RDW: 13.2 % (ref 11.5–15.5)
WBC: 11.5 10*3/uL — ABNORMAL HIGH (ref 4.0–10.5)
nRBC: 0 % (ref 0.0–0.2)

## 2022-05-12 MED ORDER — SPIRONOLACTONE 25 MG PO TABS
25.0000 mg | ORAL_TABLET | Freq: Every day | ORAL | Status: DC
  Administered 2022-05-12 – 2022-05-14 (×3): 25 mg via ORAL
  Filled 2022-05-12 (×3): qty 1

## 2022-05-12 MED ORDER — TRAMADOL HCL 50 MG PO TABS: 50.0000 mg | ORAL_TABLET | Freq: Four times a day (QID) | ORAL | 0 refills | Status: DC | PRN

## 2022-05-12 MED ORDER — ASPIRIN 325 MG PO TBEC: 325.0000 mg | DELAYED_RELEASE_TABLET | Freq: Every day | ORAL | 0 refills | Status: DC

## 2022-05-12 MED ORDER — BENAZEPRIL HCL 20 MG PO TABS
40.0000 mg | ORAL_TABLET | Freq: Every day | ORAL | Status: DC
  Administered 2022-05-12 – 2022-05-14 (×3): 40 mg via ORAL
  Filled 2022-05-12 (×3): qty 2

## 2022-05-12 MED ORDER — PRAVASTATIN SODIUM 20 MG PO TABS
40.0000 mg | ORAL_TABLET | Freq: Every day | ORAL | Status: DC
  Administered 2022-05-12 – 2022-05-14 (×3): 40 mg via ORAL
  Filled 2022-05-12 (×3): qty 2

## 2022-05-12 MED ORDER — ENSURE ENLIVE PO LIQD
237.0000 mL | Freq: Two times a day (BID) | ORAL | Status: DC
  Administered 2022-05-12 – 2022-05-14 (×3): 237 mL via ORAL

## 2022-05-12 NOTE — Progress Notes (Signed)
  Progress Note   Patient: Gina Bishop [id number] DOB: 01/29/1932 DOA: 05/11/2022     1 DOS: the patient was seen and examined on 05/12/2022   Brief hospital course: 89 year old woman presented from independent living after mechanical fall resulting in left leg pain.  Admitted for left hip fracture.  Assessment and Plan: Closed left hip fracture, initial encounter [facility]) S/p surgery 3/26  Continue management per orthopedics.  Therapy evaluations.  Acute blood loss anemia status post surgery Thrombocytopenia status post surgery Trend CBC  Essential hypertension Resume benazepril/spironolactone   Hypercholesteremia Continue statin.   Restless leg syndrome Continue pramipexole 0.5 mg p.o. nightly.   Hyponatremia Minimal.  In the setting of diuretic use. Monitor sodium level.   Hyperglycemia Random.  Mild.  Follow-up as an outpatient.   Anxiety Continue buspirone 5 mg p.o. 3 times daily as needed.      Subjective:  Feels ok Constipated   Physical Exam: Vitals:   05/11/22 2114 05/12/22 0123 05/12/22 0513 05/12/22 0937  BP: (!) 173/70 121/69 (!) 147/85 (!) 146/81  Pulse: 62 76 82 99  Resp: 16 17 17  (!) 23  Temp: 98.4 F (36.9 C) 97.8 F (36.6 C) 98.2 F (36.8 C) 97.7 F (36.5 C)  TempSrc: Oral Oral  Oral  SpO2: 95% 91% 93% 94%  Weight:      Height:       Physical Exam Vitals reviewed.  Constitutional:      General: She is not in acute distress.    Appearance: She is not ill-appearing or toxic-appearing.  Cardiovascular:     Rate and Rhythm: Normal rate and regular rhythm.     Heart sounds: No murmur heard. Pulmonary:     Effort: No respiratory distress.     Breath sounds: No wheezing, rhonchi or rales.  Neurological:     Mental Status: She is alert.  Psychiatric:        Mood and Affect: Mood normal.        Behavior: Behavior normal.    Data Reviewed: Na+ 130 WBC down to 11.5 Hgb 14.5 > 10.2 post op Plts down to 145 post op  Family  Communication: none  Disposition: Status is: Inpatient Remains inpatient appropriate because: s/p hip fracture surgery  Planned Discharge Destination:  TBD    Time spent: 25 minutes  Author: Murray Hodgkins, MD 05/12/2022 12:02 PM  For on call review [email or web address].

## 2022-05-12 NOTE — TOC Initial Note (Signed)
Transition of Care [facility]) - Initial/Assessment Note   Patient Details  Name: Gina Bishop MRN: [id number] Date of Birth: 09/11/1931  Transition of Care [facility]) CM/SW Contact:    Sherie Don, LCSW Phone Number: 05/12/2022, 3:06 PM  Clinical Narrative: PT evaluation recommended SNF and patient is agreeable. FL2 done; PASRR pending. Initial referral faxed out. Clinical documentation uploaded to [facility] MUST for review. TOC awaiting bed offers and PASRR number.  Expected Discharge Plan: Skilled Nursing Facility Barriers to Discharge: Continued Medical Work up, SNF Pending bed offer  Patient Goals and CMS Choice Patient states their goals for this hospitalization and ongoing recovery are:: Go to rehab before returning home CMS Medicare.gov Compare Post Acute Care list provided to:: Patient Choice offered to / list presented to : Patient  Expected Discharge Plan and Services In-house Referral: Clinical Social Work Post Acute Care Choice: [place] Living arrangements for the past 2 months: [place]             DME Arranged: N/A DME Agency: NA  Prior Living Arrangements/Services Living arrangements for the past 2 months: [place] Lives with:: Spouse Patient language and need for interpreter reviewed:: Yes Do you feel safe going back to the place where you live?: Yes      Need for Family Participation in Patient Care: No (Comment) Care giver support system in place?: Yes (comment) Criminal Activity/Legal Involvement Pertinent to Current Situation/Hospitalization: No - Comment as needed  Activities of Daily Living Home Assistive Devices/Equipment: Eyeglasses ADL Screening (condition at time of admission) Patient's cognitive ability adequate to safely complete daily activities?: Yes Is the patient deaf or have difficulty hearing?: No Does the patient have difficulty seeing, even when wearing glasses/contacts?: No Does the patient have  difficulty concentrating, remembering, or making decisions?: Yes Patient able to express need for assistance with ADLs?: Yes Does the patient have difficulty dressing or bathing?: Yes Independently performs ADLs?: No Communication: Needs assistance Is this a change from baseline?: Change from baseline, expected to last >3 days Dressing (OT): Needs assistance Is this a change from baseline?: Change from baseline, expected to last >3 days Grooming: Independent Feeding: Independent Bathing: Needs assistance Is this a change from baseline?: Change from baseline, expected to last >3 days Toileting: Needs assistance Is this a change from baseline?: Change from baseline, expected to last >3days In/Out Bed: Needs assistance Is this a change from baseline?: Change from baseline, expected to last >3 days Walks in Home: Needs assistance Is this a change from baseline?: Change from baseline, expected to last >3 days Does the patient have difficulty walking or climbing stairs?: Yes Weakness of Legs: Both Weakness of Arms/Hands: None  Permission Sought/Granted Permission sought to share information with : Facility Art therapist granted to share information with : Yes, Verbal Permission Granted Permission granted to share info w AGENCY: SNFs  Emotional Assessment Attitude/Demeanor/Rapport: Engaged Affect (typically observed): Accepting Orientation: : Oriented to Self, Oriented to Place, Oriented to  Time, Oriented to Situation Alcohol / Substance Use: Not Applicable  Admission diagnosis:  Closed left hip fracture, initial encounter [facility]) [S72.002A] Patient Active Problem List   Diagnosis Date Noted   Closed left hip fracture, initial encounter ([place]) 05/11/2022   Anxiety 05/11/2022   Cough 06/04/2021   Screening-pulmonary TB 04/08/2021   Leg skin lesion, right 02/20/2021   Aortic valve sclerosis 07/04/2020   Insomnia 01/24/2020   IBS (irritable bowel syndrome)  10/30/2019   Hyperglycemia 10/13/2018   Incontinence in female  10/11/2018   Frequent falls 07/28/2018   Adjustment reaction 07/28/2018   Vertigo, labyrinthine 05/15/2015   Cardiac arrhythmia 05/08/2015   Low back pain radiating to left leg 05/08/2015   Hyponatremia 05/08/2015   Solitary pulmonary nodule 05/08/2015   Routine general medical examination at a health care facility 11/22/2013   Vitamin D deficiency 11/17/2012   Degenerative joint disease of cervical spine 10/20/2011   Hypertension 05/13/2010   Hypercholesteremia 05/13/2010   Restless leg syndrome 05/13/2010   Osteoporosis 05/13/2010   PCP:  McDiarmid, Blane Ohara, MD Pharmacy:   [facility] [place], [address] [place] [address] Phone: [phone] Fax: [phone]  [id number] - [place], [address] AT [address] [place] [address] Phone: [phone] Fax: [phone]  Social Determinants of Health (SDOH) Social History: SDOH Screenings   Food Insecurity: No Food Insecurity (05/11/2022)  Housing: Low Risk  (05/11/2022)  Transportation Needs: No Transportation Needs (05/11/2022)  Utilities: Not At Risk (05/11/2022)  Alcohol Screen: Low Risk  (04/27/2022)  Depression (PHQ2-9): Low Risk  (04/27/2022)  Financial Resource Strain: Low Risk  (04/27/2022)  Physical Activity: Sufficiently Active (04/27/2022)  Social Connections: Moderately Isolated (04/27/2022)  Stress: No Stress Concern Present (04/27/2022)  Tobacco Use: Low Risk  (05/11/2022)   SDOH Interventions:    Readmission Risk Interventions     No data to display

## 2022-05-12 NOTE — NC FL2 (Signed)
[place] MEDICAID FL2 LEVEL OF CARE FORM     IDENTIFICATION  Patient Name: Gina Bishop Birthdate: 1931-05-14 Sex: female Admission Date (Current Location): 05/11/2022  [facility] and [place] Number:  Herbalist and Address:  [facility],  [address], [place]      Provider Number: [id number]  Attending Physician Name and Address:  Samuella Cota, MD  Relative Name and Phone Number:  Bethine Hermoso (spouse) Ph: [phone]    Current Level of Care: Hospital Recommended Level of Care: [place] Prior Approval Number:    Date Approved/Denied:   PASRR Number:    Discharge Plan: SNF    Current Diagnoses: Patient Active Problem List   Diagnosis Date Noted   Closed left hip fracture, initial encounter ([place]) 05/11/2022   Anxiety 05/11/2022   Cough 06/04/2021   Screening-pulmonary TB 04/08/2021   Leg skin lesion, right 02/20/2021   Aortic valve sclerosis 07/04/2020   Insomnia 01/24/2020   IBS (irritable bowel syndrome) 10/30/2019   Hyperglycemia 10/13/2018   Incontinence in female 10/11/2018   Frequent falls 07/28/2018   Adjustment reaction 07/28/2018   Vertigo, labyrinthine 05/15/2015   Cardiac arrhythmia 05/08/2015   Low back pain radiating to left leg 05/08/2015   Hyponatremia 05/08/2015   Solitary pulmonary nodule 05/08/2015   Routine general medical examination at a health care facility 11/22/2013   Vitamin D deficiency 11/17/2012   Degenerative joint disease of cervical spine 10/20/2011   Hypertension 05/13/2010   Hypercholesteremia 05/13/2010   Restless leg syndrome 05/13/2010   Osteoporosis 05/13/2010    Orientation RESPIRATION BLADDER Height & Weight     Self, Time, Situation, Place  Normal Incontinent Weight: 160 lb 7.9 oz (72.8 kg) Height:  5\' 5"  (165.1 cm)  BEHAVIORAL SYMPTOMS/MOOD NEUROLOGICAL BOWEL NUTRITION STATUS   (N/A)  (N/A) Continent Diet (Heart healthy diet)  AMBULATORY STATUS  COMMUNICATION OF NEEDS Skin   Limited Assist Verbally Surgical wounds                       Personal Care Assistance Level of Assistance  Bathing, Feeding, Dressing Bathing Assistance: Limited assistance Feeding assistance: Independent Dressing Assistance: Limited assistance     Functional Limitations Info  Sight, Hearing, Speech Sight Info: Impaired Hearing Info: Adequate Speech Info: Adequate    SPECIAL CARE FACTORS FREQUENCY  PT (By licensed PT), OT (By licensed OT)     PT Frequency: 5x's/week OT Frequency: 5x's/week            Contractures Contractures Info: Not present    Additional Factors Info  Code Status, Allergies, Psychotropic Code Status Info: DNR Allergies Info: Fish Allergy, Orange Juice (Orange Oil), Strawberry Extract, Vicodin (Hydrocodone-acetaminophen), Erythromycin, Amlodipine, Fexofenadine, Amoxicillin, Hydrochlorothiazide Psychotropic Info: Buspar         Current Medications (05/12/2022):  This is the current hospital active medication list Current Facility-Administered Medications  Medication Dose Route Frequency Provider Last Rate Last Admin   acetaminophen (TYLENOL) tablet 500 mg  500 mg Oral Q6H Porterfield, Amber, PA-C   500 mg at 05/12/22 1203   aspirin EC tablet 325 mg  325 mg Oral Q breakfast Porterfield, Amber, PA-C   325 mg at 05/12/22 0810   benazepril (LOTENSIN) tablet 40 mg  40 mg Oral Daily Samuella Cota, MD       busPIRone (BUSPAR) tablet 5 mg  5 mg Oral TID PRN Reubin Milan, MD       docusate sodium (COLACE) capsule  100 mg  100 mg Oral BID Porterfield, Amber, PA-C   100 mg at 05/12/22 0810   feeding supplement (ENSURE ENLIVE / ENSURE PLUS) liquid 237 mL  237 mL Oral BID BM Samuella Cota, MD   237 mL at 05/12/22 1400   gabapentin (NEURONTIN) capsule 100 mg  100 mg Oral BID Reubin Milan, MD   100 mg at 05/12/22 [id number]   HYDROmorphone (DILAUDID) injection 0.5 mg  0.5 mg Intravenous Q2H PRN Reubin Milan, MD       menthol-cetylpyridinium (CEPACOL) lozenge 3 mg  1 lozenge Oral PRN Porterfield, Amber, PA-C       Or   phenol (CHLORASEPTIC) mouth spray 1 spray  1 spray Mouth/Throat PRN Porterfield, Amber, PA-C       methocarbamol (ROBAXIN) tablet 500 mg  500 mg Oral Q6H PRN Porterfield, Amber, PA-C   500 mg at 05/12/22 1435   Or   methocarbamol (ROBAXIN) 500 mg in dextrose 5 % 50 mL IVPB  500 mg Intravenous Q6H PRN Porterfield, Amber, PA-C       morphine (PF) 2 MG/ML injection 0.5-1 mg  0.5-1 mg Intravenous Q2H PRN Porterfield, Amber, PA-C   1 mg at 05/11/22 2316   ondansetron (ZOFRAN) tablet 4 mg  4 mg Oral Q6H PRN Porterfield, Amber, PA-C       Or   ondansetron (ZOFRAN) injection 4 mg  4 mg Intravenous Q6H PRN Porterfield, Amber, PA-C   4 mg at 05/11/22 2115   pramipexole (MIRAPEX) tablet 0.5 mg  0.5 mg Oral QHS Reubin Milan, MD   0.5 mg at 05/11/22 2115   pravastatin (PRAVACHOL) tablet 40 mg  40 mg Oral Daily Samuella Cota, MD       spironolactone (ALDACTONE) tablet 25 mg  25 mg Oral Daily Samuella Cota, MD       traMADol Veatrice Bourbon) tablet 50 mg  50 mg Oral Q6H PRN Porterfield, Amber, PA-C   50 mg at 05/12/22 1436     Discharge Medications: Please see discharge summary for a list of discharge medications.  Relevant Imaging Results:  Relevant Lab Results:   Additional Information SSN: [id number]  Sherie Don, LCSW

## 2022-05-12 NOTE — TOC PASRR Note (Signed)
[place] Note  Patient Details  Name: Gina Bishop Date of Birth: 04-10-1931  Transition of Care [facility]) CM/SW Contact:    Sherie Don, LCSW Phone Number: 05/12/2022, 3:21 PM  To Whom It May Concern:  Please be advised that this patient will require a short-term nursing home stay - anticipated 30 days or less for rehabilitation and strengthening. The plan is for return home.

## 2022-05-12 NOTE — Plan of Care (Signed)
Plan of care reviewed and discussed. °

## 2022-05-12 NOTE — Progress Notes (Signed)
Initial Nutrition Assessment  DOCUMENTATION CODES:   Not applicable  INTERVENTION:  Ensure Plus High Protein po BID, each supplement provides 350 kcal and 20 grams of protein. Education on adequate nutrition  Education on increased nutrition needs    NUTRITION DIAGNOSIS:   Increased nutrient needs related to hip fracture as evidenced by estimated needs.   GOAL:   Patient will meet greater than or equal to 90% of their needs   MONITOR:   PO intake, Supplement acceptance, Weight trends  REASON FOR ASSESSMENT:   Consult Hip fracture protocol  ASSESSMENT:   89 y.o. female with PMHx including osteoarthritis, seasonal allergies, HLD, HTN, scoliosis, essential tremor admitted for hip/femur fracture and after a fall at ILF  Labs: Na 130, glu 124 Meds: colace Wt: 2 kg (2.6%) wt loss x 2 weeks  PO:  100% meal intake x 1 documented meal  I/O's: +2.7 L   Visited patient at bedside who is S/p left hip intramedullary nail. Patient reports fair appetite which is not her baseline. She reports N/V yesterday which caused her appetite to decrease and now she attributes it to her pain level.   Patient reports she eats 3 complete meals per day (B: eggs bacon/waffle/pancakes; L: sandwich with chips or other side, D: meat, starch, veggie). Patient reports UBW 160# which is consistent with CBW.   Patient denies N/V/D/C, problems chewing/swallowing.   She is agreeable to Ensure BID while her appetite is decreased.   NUTRITION - FOCUSED PHYSICAL EXAM:  Flowsheet Row Most Recent Value  Orbital Region No depletion  Upper Arm Region No depletion  Thoracic and Lumbar Region No depletion  Buccal Region No depletion  Temple Region No depletion  Clavicle Bone Region No depletion  Clavicle and Acromion Bone Region Mild depletion  Scapular Bone Region No depletion  Dorsal Hand Mild depletion  Patellar Region No depletion  Anterior Thigh Region No depletion  Posterior Calf Region No  depletion  Edema (RD Assessment) None  Hair Reviewed  Eyes Reviewed  Mouth Reviewed  Skin Reviewed  Nails Reviewed       Diet Order:   Diet Order             Diet Heart Room service appropriate? Yes; Fluid consistency: Thin  Diet effective now                   EDUCATION NEEDS:   Education needs have been addressed  Skin:  Skin Assessment: Reviewed RN Assessment  Last BM:  3/26  Height:   Ht Readings from Last 1 Encounters:  05/11/22 5\' 5"  (1.651 m)    Weight:   Wt Readings from Last 1 Encounters:  05/11/22 72.8 kg     BMI:  Body mass index is 26.71 kg/m.  Estimated Nutritional Needs:   Kcal:  1800  Protein:  90  Fluid:  >/= 2L    Trey Paula, RDN, LDN  Clinical Nutrition

## 2022-05-12 NOTE — Evaluation (Signed)
Physical Therapy Evaluation Patient Details Name: Gina Bishop MRN: [id number] DOB: 12/06/1931 Today's Date: 05/12/2022  History of Present Illness  89 year old woman presented from independent living after mechanical fall resulting in left leg pain.  Admitted for left hip fracture.. ortho consulted and pt is s/p IM nail L femur on 05/12/23  Clinical Impression  Pt admitted with above diagnosis.  Pt I/Mod I prior to admission. Resides at ILF. Pt will benefit from post acute rehab  Pt currently with functional limitations due to the deficits listed below (see PT Problem List). Pt will benefit from acute skilled PT to increase their independence and safety with mobility to allow discharge.          Recommendations for follow up therapy are one component of a multi-disciplinary discharge planning process, led by the attending physician.  Recommendations may be updated based on patient status, additional functional criteria and insurance authorization.  Follow Up Recommendations Can patient physically be transported by private vehicle: No     Assistance Recommended at Discharge Frequent or constant Supervision/Assistance  Patient can return home with the following  A little help with walking and/or transfers;A little help with bathing/dressing/bathroom;Assistance with cooking/housework;Assist for transportation;Help with stairs or ramp for entrance    Equipment Recommendations None recommended by PT  Recommendations for Other Services       Functional Status Assessment Patient has had a recent decline in their functional status and demonstrates the ability to make significant improvements in function in a reasonable and predictable amount of time.     Precautions / Restrictions Precautions Precautions: Fall Restrictions Weight Bearing Restrictions: No      Mobility  Bed Mobility Overal bed mobility: Needs Assistance Bed Mobility: Supine to Sit     Supine to sit: Mod assist      General bed mobility comments: cues for technique and to self assist; incr time needed, assist to laterally scoot, progress LLE off bed and elevate trunk    Transfers Overall transfer level: Needs assistance Equipment used: Rolling walker (2 wheels) Transfers: Sit to/from Stand Sit to Stand: Min assist, +2 safety/equipment, +2 physical assistance           General transfer comment: cues for hand placement and LLE position    Ambulation/Gait Ambulation/Gait assistance: Min assist, +2 safety/equipment Gait Distance (Feet): 4 Feet Assistive device: Rolling walker (2 wheels) Gait Pattern/deviations: Step-to pattern       General Gait Details: cues for sequence and use of UEs to off load LLE  Stairs            Wheelchair Mobility    Modified Rankin (Stroke Patients Only)       Balance Overall balance assessment: History of Falls, Needs assistance Sitting-balance support: Feet supported, Bilateral upper extremity supported, No upper extremity supported Sitting balance-Leahy Scale: Fair     Standing balance support: Reliant on assistive device for balance, During functional activity Standing balance-Leahy Scale: Poor                               Pertinent Vitals/Pain Pain Assessment Pain Assessment: Faces Faces Pain Scale: Hurts even more Pain Location: L hip Pain Descriptors / Indicators: Aching, Grimacing, Sore Pain Intervention(s): Limited activity within patient's tolerance, Monitored during session, Repositioned, Ice applied, RN gave pain meds during session    Home Living Family/patient expects to be discharged to:: Skilled nursing facility  Prior Function Prior Level of Function : Independent/Modified Independent             Mobility Comments: amb with  cane       Hand Dominance        Extremity/Trunk Assessment   Upper Extremity Assessment Upper Extremity Assessment: Defer to OT  evaluation    Lower Extremity Assessment Lower Extremity Assessment: LLE deficits/detail;RLE deficits/detail RLE Deficits / Details: grossly WFL LLE: Unable to fully assess due to pain       Communication      Cognition Arousal/Alertness: Awake/alert Behavior During Therapy: WFL for tasks assessed/performed Overall Cognitive Status: Within Functional Limits for tasks assessed                                          General Comments      Exercises     Assessment/Plan    PT Assessment Patient needs continued PT services  PT Problem List Decreased strength;Decreased range of motion;Decreased activity tolerance;Decreased balance;Decreased mobility;Decreased knowledge of precautions;Decreased knowledge of use of DME;Pain       PT Treatment Interventions DME instruction;Therapeutic exercise;Gait training;Functional mobility training;Therapeutic activities;Patient/family education;Balance training    PT Goals (Current goals can be found in the Care Plan section)  Acute Rehab PT Goals PT Goal Formulation: With patient Time For Goal Achievement: 05/26/22 Potential to Achieve Goals: Good    Frequency Min 3X/week     Co-evaluation               AM-PAC PT "6 Clicks" Mobility  Outcome Measure Help needed turning from your back to your side while in a flat bed without using bedrails?: A Little Help needed moving from lying on your back to sitting on the side of a flat bed without using bedrails?: A Little Help needed moving to and from a bed to a chair (including a wheelchair)?: A Lot Help needed standing up from a chair using your arms (e.g., wheelchair or bedside chair)?: A Lot Help needed to walk in hospital room?: A Lot Help needed climbing 3-5 steps with a railing? : Total 6 Click Score: 13    End of Session Equipment Utilized During Treatment: Gait belt Activity Tolerance: Patient limited by fatigue;Patient limited by pain Patient left: with  call bell/phone within reach;in chair;with chair alarm set;with nursing/sitter in room Nurse Communication: Mobility status PT Visit Diagnosis: Other abnormalities of gait and mobility (R26.89);Difficulty in walking, not elsewhere classified (R26.2)    Time: [id number] PT Time Calculation (min) (ACUTE ONLY): 14 min   Charges:   PT Evaluation $PT Eval Low Complexity: [place], PT  Acute Rehab Dept ([facility]) [phone]  05/12/2022   [facility] 05/12/2022, 2:55 PM

## 2022-05-12 NOTE — Plan of Care (Signed)
  Problem: Clinical Measurements: Goal: Ability to maintain clinical measurements within normal limits will improve Outcome: Progressing   Problem: Pain Managment: Goal: General experience of comfort will improve Outcome: Progressing   Problem: Safety: Goal: Ability to remain free from injury will improve Outcome: Progressing   

## 2022-05-12 NOTE — Progress Notes (Signed)
PATIENT ID: Gina Bishop  MRN: [id number]  DOB/AGE:  [birth year] / 89 y.o.  1 Day Post-Op Procedure(s) (LRB): INTRAMEDULLARY (IM) NAIL INTERTROCHANTERIC (Left)  Subjective: Patient reports mild discomfort in the left hip. She was able to get some rest last night. No c/o chest pain or SOB.     Objective: Vital signs in last 24 hours: Temp:  [97.7 F (36.5 C)-98.8 F (37.1 C)] 98.2 F (36.8 C) (03/27 0513) Pulse Rate:  [59-91] 82 (03/27 0513) Resp:  [12-21] 17 (03/27 0513) BP: (90-189)/(50-100) 147/85 (03/27 0513) SpO2:  [90 %-100 %] 93 % (03/27 0513) Weight:  [72.8 kg] 72.8 kg (03/26 1349)  Intake/Output from previous day: 03/26 0701 - 03/27 0700 In: 2530.5 [P.O.:185; I.V.:1945.5; IV Piggyback:400] Out: 250 [Urine:200; Blood:50]   Recent Labs    05/11/22 0957 05/12/22 0353  HGB 14.5 10.2*   Recent Labs    05/11/22 0957 05/12/22 0353  WBC 15.4* 11.5*  RBC 4.74 3.36*  HCT 42.9 31.0*  PLT 208 145*   Recent Labs    05/11/22 0957 05/12/22 0353  NA 133* 130*  K 4.5 4.5  CL 99 102  CO2 24 21*  BUN 20 19  CREATININE 0.81 0.79  GLUCOSE 123* 124*  CALCIUM 9.3 8.1*   Recent Labs    05/11/22 0957  INR 1.0    Physical Exam: Neurologically intact Sensation intact distally Intact pulses distally Dorsiflexion/Plantar flexion intact Incision: dressing C/D/I No cellulitis present Compartment soft  Assessment/Plan: 1 Day Post-Op Procedure(s) (LRB): INTRAMEDULLARY (IM) NAIL INTERTROCHANTERIC (Left)   Advance diet Up with therapy Continue ABX therapy 24 hours post op prophylaxis Weight Bearing as Tolerated (WBAT) VTE prophylaxis:  aspirin 325mg  daily  Plan for patient to get up and work with PT today. Likely plan for return home to her assisted living facility when medically stable. Rx for Tramadol and aspirin on chart. Follow up in office to see Dr Gina Bishop in 2 weeks. Will continue to follow while in house.    Gina Rengel L. Porterfield, PA-C 05/12/2022, 8:13 AM

## 2022-05-12 NOTE — Hospital Course (Signed)
89 year old woman presented from independent living after mechanical fall resulting in left leg pain.  Admitted for left hip fracture.

## 2022-05-13 ENCOUNTER — Encounter (HOSPITAL_COMMUNITY): Payer: Self-pay | Admitting: Orthopedic Surgery

## 2022-05-13 DIAGNOSIS — D62 Acute posthemorrhagic anemia: Secondary | ICD-10-CM | POA: Diagnosis not present

## 2022-05-13 DIAGNOSIS — S72002A Fracture of unspecified part of neck of left femur, initial encounter for closed fracture: Secondary | ICD-10-CM | POA: Diagnosis not present

## 2022-05-13 DIAGNOSIS — G2581 Restless legs syndrome: Secondary | ICD-10-CM

## 2022-05-13 DIAGNOSIS — D696 Thrombocytopenia, unspecified: Secondary | ICD-10-CM | POA: Insufficient documentation

## 2022-05-13 LAB — CBC
HCT: 28.2 % — ABNORMAL LOW (ref 36.0–46.0)
Hemoglobin: 9.5 g/dL — ABNORMAL LOW (ref 12.0–15.0)
MCH: 31 pg (ref 26.0–34.0)
MCHC: 33.7 g/dL (ref 30.0–36.0)
MCV: 92.2 fL (ref 80.0–100.0)
Platelets: 144 10*3/uL — ABNORMAL LOW (ref 150–400)
RBC: 3.06 MIL/uL — ABNORMAL LOW (ref 3.87–5.11)
RDW: 13.4 % (ref 11.5–15.5)
WBC: 9.6 10*3/uL (ref 4.0–10.5)
nRBC: 0 % (ref 0.0–0.2)

## 2022-05-13 NOTE — Plan of Care (Signed)
  Problem: Education: Goal: Knowledge of General Education information will improve Description: Including pain rating scale, medication(s)/side effects and non-pharmacologic comfort measures Outcome: Progressing   Problem: Activity: Goal: Risk for activity intolerance will decrease Outcome: Progressing   Problem: Pain Managment: Goal: General experience of comfort will improve Outcome: Progressing   

## 2022-05-13 NOTE — Progress Notes (Signed)
PATIENT ID: Gina Bishop  MRN: [id number]  DOB/AGE:  March 22, 1931 / 89 y.o.  2 Days Post-Op Procedure(s) (LRB): INTRAMEDULLARY (IM) NAIL INTERTROCHANTERIC (Left)  Subjective: Patient reports that she is feeling better than yesterday. Minimal pain in the left hip. Denies shortness of breath and chest pain. Voiding well and positive flatus.   Objective: Vital signs in last 24 hours: Temp:  [97.7 F (36.5 C)-98.6 F (37 C)] 98.3 F (36.8 C) (03/28 0500) Pulse Rate:  [83-99] 83 (03/28 0500) Resp:  [17-23] 17 (03/28 0500) BP: (146-151)/(74-82) 148/74 (03/28 0500) SpO2:  [92 %-95 %] 94 % (03/28 0500)  Intake/Output from previous day: 03/27 0701 - 03/28 0700 In: 1630.4 [P.O.:1345; I.V.:285.4] Out: 700 [Urine:700]   Recent Labs    05/11/22 0957 05/12/22 0353 05/13/22 0356  HGB 14.5 10.2* 9.5*   Recent Labs    05/12/22 0353 05/13/22 0356  WBC 11.5* 9.6  RBC 3.36* 3.06*  HCT 31.0* 28.2*  PLT 145* 144*   Recent Labs    05/11/22 0957 05/12/22 0353  NA 133* 130*  K 4.5 4.5  CL 99 102  CO2 24 21*  BUN 20 19  CREATININE 0.81 0.79  GLUCOSE 123* 124*  CALCIUM 9.3 8.1*   Recent Labs    05/11/22 0957  INR 1.0    Physical Exam: Neurologically intact Sensation intact distally Intact pulses distally Dorsiflexion/Plantar flexion intact Incision: dressing C/D/I No cellulitis present Compartment soft  Assessment/Plan: 2 Days Post-Op Procedure(s) (LRB): INTRAMEDULLARY (IM) NAIL INTERTROCHANTERIC (Left)   Advance diet Up with therapy Weight Bearing as Tolerated (WBAT) VTE prophylaxis:  aspirin 325mg  daily   Plan for patient to get up and work with PT today. Likely plan for return home to her assisted living facility when medically stable. Rx for Tramadol and aspirin on chart. Follow up in office to see Dr Tamera Punt in 2 weeks. Will continue to follow while in house.   Atleigh Gruen L. [place], PA-C 05/13/2022, 8:50 AM

## 2022-05-13 NOTE — Progress Notes (Signed)
Physical Therapy Treatment Patient Details Name: Gina Bishop MRN: [id number] DOB: 01-24-1932 Today's Date: 05/13/2022   History of Present Illness 89 year old woman presented from independent living after mechanical fall resulting in left leg pain.  Admitted for left hip fracture.. ortho consulted and pt is s/p IM nail L femur on 05/12/23    PT Comments    Pt progressing with Pt, motivated to work with PT; amb short distance and then requested to use BSC; pt requested privacy, left on BSC with call bell. Pt will benefit from continued therapy post acute  Recommendations for follow up therapy are one component of a multi-disciplinary discharge planning process, led by the attending physician.  Recommendations may be updated based on patient status, additional functional criteria and insurance authorization.  Follow Up Recommendations  Can patient physically be transported by private vehicle: No    Assistance Recommended at Discharge Frequent or constant Supervision/Assistance  Patient can return home with the following A little help with walking and/or transfers;A little help with bathing/dressing/bathroom;Assistance with cooking/housework;Assist for transportation;Help with stairs or ramp for entrance   Equipment Recommendations  None recommended by PT    Recommendations for Other Services       Precautions / Restrictions Precautions Precautions: Fall Restrictions Weight Bearing Restrictions: No     Mobility  Bed Mobility               General bed mobility comments: in recliner    Transfers Overall transfer level: Needs assistance Equipment used: Rolling walker (2 wheels) Transfers: Sit to/from Stand Sit to Stand: Min assist           General transfer comment: STS x2, LOB posteriorly on initial stand and assisted to chair, assist to maintain anterior wt shift cues for hand placement.    Ambulation/Gait Ambulation/Gait assistance: Min assist Gait Distance  (Feet): 8 Feet Assistive device: Rolling walker (2 wheels) Gait Pattern/deviations: Step-to pattern       General Gait Details: cues for sequence and RW position from self   Stairs             Wheelchair Mobility    Modified Rankin (Stroke Patients Only)       Balance Overall balance assessment: History of Falls, Needs assistance Sitting-balance support: Feet supported, Bilateral upper extremity supported, No upper extremity supported Sitting balance-Leahy Scale: Fair     Standing balance support: Reliant on assistive device for balance, During functional activity Standing balance-Leahy Scale: Poor                              Cognition Arousal/Alertness: Awake/alert Behavior During Therapy: WFL for tasks assessed/performed Overall Cognitive Status: Within Functional Limits for tasks assessed                                          Exercises General Exercises - Lower Extremity Ankle Circles/Pumps: AROM, Both, 10 reps    General Comments        Pertinent Vitals/Pain Pain Assessment Pain Assessment: 0-10 Pain Score: 4  Pain Location: L hip Pain Descriptors / Indicators: Aching, Grimacing, Sore Pain Intervention(s): Limited activity within patient's tolerance, Monitored during session, Premedicated before session    Home Living                          Prior  Function            PT Goals (current goals can now be found in the care plan section) Acute Rehab PT Goals PT Goal Formulation: With patient Time For Goal Achievement: 087/10/24 Potential to Achieve Goals: Good Progress towards PT goals: Progressing toward goals    Frequency    Min 3X/week      PT Plan Current plan remains appropriate    Co-evaluation              AM-PAC PT "6 Clicks" Mobility   Outcome Measure  Help needed turning from your back to your side while in a flat bed without using bedrails?: A Little Help needed moving  from lying on your back to sitting on the side of a flat bed without using bedrails?: A Little Help needed moving to and from a bed to a chair (including a wheelchair)?: A Little Help needed standing up from a chair using your arms (e.g., wheelchair or bedside chair)?: A Little Help needed to walk in hospital room?: A Lot Help needed climbing 3-5 steps with a railing? : A Lot 6 Click Score: 16    End of Session Equipment Utilized During Treatment: Gait belt Activity Tolerance: Patient limited by fatigue Patient left: with call bell/phone within reach;Other (comment) (BSC, NT aware) Nurse Communication: Mobility status PT Visit Diagnosis: Other abnormalities of gait and mobility (R26.89);Difficulty in walking, not elsewhere classified (R26.2)     Time: [id number] PT Time Calculation (min) (ACUTE ONLY): 17 min  Charges:  $Gait Training: 8-22 mins                     Baxter Flattery, PT  Acute Rehab Dept ([place]) [phone]  05/13/2022    [facility] 05/13/2022, 1:13 PM

## 2022-05-13 NOTE — Progress Notes (Signed)
PT TX NOTE  05/13/22 1300  PT Visit Information  Last PT Received On 05/13/22  Assistance Needed +1 (+2 for progression)  Pt with likely vagal episode while on BSC; assisted NT and RN with pt transfer from [facility] to bed; pt alert and with VSS on departure; RN and NT present throughout session.  History of Present Illness 89 year old woman presented from independent living after mechanical fall resulting in left leg pain.  Admitted for left hip fracture.. ortho consulted and pt is s/p IM nail L femur on 05/12/23  Precautions  Precautions Fall  Restrictions  Weight Bearing Restrictions No  Pain Assessment  Pain Assessment 0-10  Pain Score 4  Pain Location L hip  Pain Descriptors / Indicators Aching;Grimacing;Sore  Pain Intervention(s) Limited activity within patient's tolerance;Monitored during session;Premedicated before session  Cognition  Arousal/Alertness Lethargic (likely vagal repsonse on BSC, NT and RN in room)  Behavior During Therapy [facility] for tasks assessed/performed  Overall Cognitive Status Within Functional Limits for tasks assessed  General Comments initially lethargic however alert and oriented end of session  Bed Mobility  Overal bed mobility Needs Assistance  Bed Mobility Sit to Supine  Sit to supine Mod assist;+2 for physical assistance;+2 for safety/equipment;Max assist  General bed mobility comments assist  Transfers  Overall transfer level Needs assistance  Equipment used Rolling walker (2 wheels)  Transfers Sit to/from Stand  Sit to Stand Mod assist;Max assist;+2 physical assistance;+2 safety/equipment  General transfer comment 2 assist to stand and maintain trunk extension to manipulate enviroment for BSC/bed switch  Ambulation/Gait  General Gait Details deferred  Balance  Overall balance assessment History of Falls;Needs assistance  Sitting-balance support Feet supported;Bilateral upper extremity supported;No upper extremity supported  Sitting balance-Leahy  Scale Fair  Standing balance support Reliant on assistive device for balance;During functional activity  Standing balance-Leahy Scale Poor  General Exercises - Lower Extremity  Ankle Circles/Pumps AROM;Both;10 reps  PT - End of Session  Equipment Utilized During Treatment Gait belt  Activity Tolerance Patient limited by fatigue  Nurse Communication Mobility status   PT - Assessment/Plan  PT Plan Current plan remains appropriate  PT Visit Diagnosis Other abnormalities of gait and mobility (R26.89);Difficulty in walking, not elsewhere classified (R26.2)  PT Frequency (ACUTE ONLY) Min 3X/week  Follow Up Recommendations Skilled nursing-short term rehab (<3 hours/day)  Can patient physically be transported by private vehicle No  Assistance recommended at discharge Frequent or constant Supervision/Assistance  Patient can return home with the following A little help with walking and/or transfers;A little help with bathing/dressing/bathroom;Assistance with cooking/housework;Assist for transportation;Help with stairs or ramp for entrance  PT equipment None recommended by PT  AM-PAC PT "6 Clicks" Mobility Outcome Measure (Version 2)  Help needed turning from your back to your side while in a flat bed without using bedrails? 3  Help needed moving from lying on your back to sitting on the side of a flat bed without using bedrails? 3  Help needed moving to and from a bed to a chair (including a wheelchair)? 3  Help needed standing up from a chair using your arms (e.g., wheelchair or bedside chair)? 3  Help needed to walk in hospital room? 2  Help needed climbing 3-5 steps with a railing?  2  6 Click Score 16  Consider Recommendation of Discharge To: Home with [facility]  PT Goal Progression  Progress towards PT goals Progressing toward goals  Acute Rehab PT Goals  PT Goal Formulation With patient  Time For Goal Achievement 05/26/22  Potential to Achieve Goals Good  PT Time Calculation  PT Start Time  (ACUTE ONLY) 1234  PT Stop Time (ACUTE ONLY) 1249  PT Time Calculation (min) (ACUTE ONLY) 15 min  PT General Charges  $$ ACUTE PT VISIT 1 Visit  PT Treatments  $Therapeutic Activity 8-22 mins

## 2022-05-13 NOTE — Progress Notes (Addendum)
  Progress Note   Patient: Gina Bishop [id number] DOB: 04/01/1931 DOA: 05/11/2022     2 DOS: the patient was seen and examined on 05/13/2022   Brief hospital course: 89 year old woman presented from independent living after mechanical fall resulting in left leg pain.  Admitted for left hip fracture.  Status-post surgery without apparent complication.  Plan for SNF.  Assessment and Plan: Closed left hip fracture, initial encounter [facility]) S/p surgery 3/26  Continue management per orthopedics.  Plan for SNF   Acute blood loss anemia status post surgery Thrombocytopenia status post surgery Hgb now stable at 9.5 Platelets stable at 144   Essential hypertension Stable, continue benazepril/spironolactone   Hypercholesteremia Continue statin.   Restless leg syndrome Continue pramipexole 0.5 mg p.o. nightly.   Hyponatremia Minimal.  In the setting of diuretic use. Monitor sodium level periodically.   Hyperglycemia Random.  Mild.  Follow-up as an outpatient.   Anxiety Continue buspirone 5 mg p.o. 3 times daily as needed.      Subjective:  Feels ok now Had a vagal episode on toilet earlier  Physical Exam: Vitals:   05/12/22 0937 05/12/22 1338 05/12/22 2138 05/13/22 0500  BP: (!) 146/81 (!) 151/80 (!) 146/82 (!) 148/74  Pulse: 99 96 86 83  Resp: (!) 23 20 18 17   Temp: 97.7 F (36.5 C) 98.5 F (36.9 C) 98.6 F (37 C) 98.3 F (36.8 C)  TempSrc: Oral Oral Oral Oral  SpO2: 94% 95% 92% 94%  Weight:      Height:       Physical Exam Vitals reviewed.  Constitutional:      General: She is not in acute distress.    Appearance: She is not ill-appearing or toxic-appearing.  Cardiovascular:     Rate and Rhythm: Normal rate and regular rhythm.     Heart sounds: No murmur heard. Pulmonary:     Effort: Pulmonary effort is normal. No respiratory distress.     Breath sounds: No wheezing, rhonchi or rales.  Neurological:     Mental Status: She is alert.  Psychiatric:         Mood and Affect: Mood normal.        Behavior: Behavior normal.   Data Reviewed: Hgb stable 9.5 Platelets stable at 144  Family Communication: granddaughter at bedside  Disposition: Status is: Inpatient Remains inpatient appropriate because: hip fracture, needs SNF  Planned Discharge Destination: Skilled nursing facility    Time spent: 20 minutes  Author: Murray Hodgkins, MD 05/13/2022 1:40 PM  For on call review [email or web address].

## 2022-05-13 NOTE — TOC Progression Note (Signed)
Transition of Care [facility]) - Progression Note   Patient Details  Name: Gina Bishop MRN: [id number] Date of Birth: 1931-10-30  [facility]) CM/SW [place], LCSW Phone Number: 05/13/2022, 10:05 AM  Clinical Narrative: CSW reviewed bed offers with patient and son. Patient and son chose [name]. CSW confirmed bed will be available tomorrow with [place] in admissions. PASRR number received: [id number]. CSW updated patient and son that the facility will be able to accept her tomorrow.   Expected Discharge Plan: [place] Barriers to Discharge: Continued Medical Work up  Expected Discharge Plan and Services In-house Referral: Clinical Social Work Post Acute Care Choice: [place] Living arrangements for the past 2 months: [place]              DME Arranged: N/A DME Agency: NA  Social Determinants of Health (SDOH) Interventions SDOH Screenings   Food Insecurity: No Food Insecurity (05/11/2022)  Housing: Low Risk  (05/11/2022)  Transportation Needs: No Transportation Needs (05/11/2022)  Utilities: Not At Risk (05/11/2022)  Alcohol Screen: Low Risk  (04/27/2022)  Depression (PHQ2-9): Low Risk  (04/27/2022)  Financial Resource Strain: Low Risk  (04/27/2022)  Physical Activity: Sufficiently Active (04/27/2022)  Social Connections: Moderately Isolated (04/27/2022)  Stress: No Stress Concern Present (04/27/2022)  Tobacco Use: Low Risk  (05/11/2022)   Readmission Risk Interventions     No data to display

## 2022-05-14 DIAGNOSIS — N183 Chronic kidney disease, stage 3 unspecified: Secondary | ICD-10-CM | POA: Diagnosis not present

## 2022-05-14 DIAGNOSIS — D62 Acute posthemorrhagic anemia: Secondary | ICD-10-CM | POA: Diagnosis not present

## 2022-05-14 DIAGNOSIS — E785 Hyperlipidemia, unspecified: Secondary | ICD-10-CM | POA: Diagnosis not present

## 2022-05-14 DIAGNOSIS — G2581 Restless legs syndrome: Secondary | ICD-10-CM | POA: Diagnosis not present

## 2022-05-14 DIAGNOSIS — R2689 Other abnormalities of gait and mobility: Secondary | ICD-10-CM | POA: Diagnosis not present

## 2022-05-14 DIAGNOSIS — S72002D Fracture of unspecified part of neck of left femur, subsequent encounter for closed fracture with routine healing: Secondary | ICD-10-CM | POA: Diagnosis not present

## 2022-05-14 DIAGNOSIS — M109 Gout, unspecified: Secondary | ICD-10-CM | POA: Diagnosis not present

## 2022-05-14 DIAGNOSIS — M1990 Unspecified osteoarthritis, unspecified site: Secondary | ICD-10-CM | POA: Diagnosis not present

## 2022-05-14 DIAGNOSIS — M25552 Pain in left hip: Secondary | ICD-10-CM | POA: Diagnosis not present

## 2022-05-14 DIAGNOSIS — K5900 Constipation, unspecified: Secondary | ICD-10-CM | POA: Diagnosis not present

## 2022-05-14 DIAGNOSIS — M5030 Other cervical disc degeneration, unspecified cervical region: Secondary | ICD-10-CM | POA: Diagnosis not present

## 2022-05-14 DIAGNOSIS — F419 Anxiety disorder, unspecified: Secondary | ICD-10-CM | POA: Diagnosis not present

## 2022-05-14 DIAGNOSIS — S72002A Fracture of unspecified part of neck of left femur, initial encounter for closed fracture: Secondary | ICD-10-CM | POA: Diagnosis not present

## 2022-05-14 DIAGNOSIS — N390 Urinary tract infection, site not specified: Secondary | ICD-10-CM | POA: Diagnosis not present

## 2022-05-14 DIAGNOSIS — Z4789 Encounter for other orthopedic aftercare: Secondary | ICD-10-CM | POA: Diagnosis not present

## 2022-05-14 DIAGNOSIS — S72142D Displaced intertrochanteric fracture of left femur, subsequent encounter for closed fracture with routine healing: Secondary | ICD-10-CM | POA: Diagnosis not present

## 2022-05-14 DIAGNOSIS — I10 Essential (primary) hypertension: Secondary | ICD-10-CM | POA: Diagnosis not present

## 2022-05-14 DIAGNOSIS — D696 Thrombocytopenia, unspecified: Secondary | ICD-10-CM | POA: Diagnosis not present

## 2022-05-14 DIAGNOSIS — E871 Hypo-osmolality and hyponatremia: Secondary | ICD-10-CM

## 2022-05-14 DIAGNOSIS — I6529 Occlusion and stenosis of unspecified carotid artery: Secondary | ICD-10-CM | POA: Diagnosis not present

## 2022-05-14 DIAGNOSIS — G8911 Acute pain due to trauma: Secondary | ICD-10-CM | POA: Diagnosis not present

## 2022-05-14 DIAGNOSIS — E7800 Pure hypercholesterolemia, unspecified: Secondary | ICD-10-CM | POA: Diagnosis not present

## 2022-05-14 DIAGNOSIS — Z7401 Bed confinement status: Secondary | ICD-10-CM | POA: Diagnosis not present

## 2022-05-14 NOTE — Progress Notes (Signed)
  Progress Note   Patient: Gina Bishop [id number] DOB: 1931/10/10 DOA: 05/11/2022     3 DOS: the patient was seen and examined on 05/14/2022   Brief hospital course: 89 year old woman presented from independent living after mechanical fall resulting in left leg pain.  Admitted for left hip fracture.  Status-post surgery without apparent complication.  Plan for SNF.  Assessment and Plan: Closed left hip fracture, initial encounter [facility]) S/p surgery 3/26  Continue management per orthopedics.  Plan for SNF today   Acute blood loss anemia status post surgery Thrombocytopenia status post surgery Hgb stable at 9.5 Platelets stable at 144 Recommend repeat CBC as needed   Essential hypertension Stable, continue benazepril/spironolactone   Hypercholesteremia Continue statin.   Restless leg syndrome Continue pramipexole 0.5 mg p.o. nightly.   Hyponatremia Minimal.  In the setting of diuretic use. Monitor sodium level periodically.   Hyperglycemia Random.  Mild.  Follow-up as an outpatient.   Anxiety Continue buspirone 5 mg p.o. 3 times daily as needed.      Subjective:  Feels fine  Physical Exam: Vitals:   05/13/22 0500 05/13/22 1357 05/13/22 2128 05/14/22 0455  BP: (!) 148/74 134/61 130/88 136/74  Pulse: 83 77 (!) 56 76  Resp: 17 (!) 24    Temp: 98.3 F (36.8 C) 98.7 F (37.1 C) 98.4 F (36.9 C) 98.4 F (36.9 C)  TempSrc: Oral Oral Oral Oral  SpO2: 94%  98% 93%  Weight:      Height:       Physical Exam Vitals reviewed.  Constitutional:      General: She is not in acute distress.    Appearance: She is not ill-appearing or toxic-appearing.  Cardiovascular:     Heart sounds: No murmur heard. Pulmonary:     Effort: Pulmonary effort is normal. No respiratory distress.     Breath sounds: No wheezing, rhonchi or rales.  Neurological:     Mental Status: She is alert.  Psychiatric:        Mood and Affect: Mood normal.        Behavior: Behavior normal.      Data Reviewed: No new data  Family Communication: none  Disposition: Status is: Inpatient Remains inpatient appropriate because: SNF  Planned Discharge Destination: Skilled nursing facility    Time spent: 20 minutes  Author: Murray Hodgkins, MD 05/14/2022 10:31 AM  For on call review [email or web address].

## 2022-05-14 NOTE — TOC Transition Note (Signed)
Transition of Care [facility]) - CM/SW Discharge Note  Patient Details  Name: Gina Bishop MRN: [id number] Date of Birth: 1932-01-27  Transition of Care [facility]) CM/SW Contact:  Sherie Don, LCSW Phone Number: 05/14/2022, 11:22 AM  Clinical Narrative: Patient is medically stable to discharge to [name] today. Patient will go to room 501 and the number for report is [phone]. Discharge summary, discharge orders, and SNF transfer report faxed to facility in hub. Medical necessity form done; PTAR scheduled. Discharge packet completed. Patient notified of discharge and transportation being set up. CSW left voicemail for patient's son, Gina Bishop, regarding discharge. RN updated. TOC signing off.    Final next level of care: Skilled Nursing Facility Barriers to Discharge: Barriers Resolved  Patient Goals and CMS Choice CMS Medicare.gov Compare Post Acute Care list provided to:: Patient Choice offered to / list presented to : Patient  Discharge Placement PASRR number recieved: 05/13/22     [facility] chooses bed at: [name] Patient to be transferred to facility by: [name] Name of family member notified: Gina Bishop (son) Patient and family notified of of transfer: 05/14/22  Discharge Plan and Services Additional resources added to the After Visit Summary for   In-house Referral: Clinical Social Work Post Acute Care Choice: [place]          DME Arranged: N/A DME Agency: NA  Social Determinants of Health (SDOH) Interventions SDOH Screenings   Food Insecurity: No Food Insecurity (05/11/2022)  Housing: Low Risk  (05/11/2022)  Transportation Needs: No Transportation Needs (05/11/2022)  Utilities: Not At Risk (05/11/2022)  Alcohol Screen: Low Risk  (04/27/2022)  Depression (PHQ2-9): Low Risk  (04/27/2022)  Financial Resource Strain: Low Risk  (04/27/2022)  Physical Activity: Sufficiently Active (04/27/2022)  Social Connections: Moderately Isolated (04/27/2022)  Stress: No  Stress Concern Present (04/27/2022)  Tobacco Use: Low Risk  (05/13/2022)   Readmission Risk Interventions     No data to display

## 2022-05-14 NOTE — Discharge Summary (Signed)
Patient ID: Gina Bishop MRN: [id number] DOB/AGE: [birth year] 89 y.o.  Admit date: 05/11/2022 Discharge date: 05/14/2022  Admission Diagnoses:  Principal Problem:   Closed left hip fracture, initial encounter [facility]) Active Problems:   Hypertension   Hypercholesteremia   Restless leg syndrome   Hyponatremia   Hyperglycemia   Anxiety   ABLA (acute blood loss anemia)   Thrombocytopenia ([place])   Discharge Diagnoses:  Same  Past Medical History:  Diagnosis Date   Allergy    Arthritis    Blood transfusion    Hyperlipidemia    Hypertension    Scoliosis     Surgeries: Procedure(s): INTRAMEDULLARY (IM) NAIL INTERTROCHANTERIC on 05/11/2022   Consultants: Treatment Team:  Tania Ade, MD  Discharged Condition: Improved  Hospital Course: Gina Bishop is an 89 y.o. female who was admitted 05/11/2022 for operative treatment ofClosed left hip fracture, initial encounter. Patient has severe unremitting pain that affects sleep, daily activities, and work/hobbies. After pre-op clearance the patient was taken to the operating room on 05/11/2022 and underwent  Procedure(s): INTRAMEDULLARY (IM) NAIL INTERTROCHANTERIC.    Patient was given perioperative antibiotics:  Anti-infectives (From admission, onward)    Start     Dose/Rate Route Frequency Ordered Stop   05/12/22 0600  ceFAZolin (ANCEF) IVPB 2g/100 mL premix        2 g 200 mL/hr over 30 Minutes Intravenous On call to O.R. 05/11/22 1339 05/12/22 0533   05/11/22 2300  ceFAZolin (ANCEF) IVPB 2g/100 mL premix        2 g 200 mL/hr over 30 Minutes Intravenous Every 6 hours 05/11/22 2007 05/12/22 0535        Patient was given sequential compression devices, early ambulation, and chemoprophylaxis to prevent DVT.  Patient benefited maximally from hospital stay and there were no complications.    Recent vital signs: Patient Vitals for the past 24 hrs:  BP Temp Temp src Pulse Resp SpO2  05/14/22 0455 136/74 98.4 F (36.9 C) Oral 76  -- 93 %  05/13/22 2128 130/88 98.4 F (36.9 C) Oral (!) 56 -- 98 %  05/13/22 1357 134/61 98.7 F (37.1 C) Oral 77 (!) 24 --     Recent laboratory studies:  Recent Labs    05/11/22 0957 05/12/22 0353 05/13/22 0356  WBC 15.4* 11.5* 9.6  HGB 14.5 10.2* 9.5*  HCT 42.9 31.0* 28.2*  PLT 208 145* 144*  NA 133* 130*  --   K 4.5 4.5  --   CL 99 102  --   CO2 24 21*  --   BUN 20 19  --   CREATININE 0.81 0.79  --   GLUCOSE 123* 124*  --   INR 1.0  --   --   CALCIUM 9.3 8.1*  --      Discharge Medications:   Allergies as of 05/14/2022       Reactions   Fish Allergy    Orange Juice [orange Oil]    Strawberry Extract    Vicodin [hydrocodone-acetaminophen] Other (See Comments)   hallucinatations and nightmares   Erythromycin Diarrhea   Amlodipine    Leg swelling   Fexofenadine    Sedated in the 180 mg dose.   Amoxicillin Nausea And Vomiting   Hydrochlorothiazide Other (See Comments)   Hyponatremia while on HCTZ        Medication List     TAKE these medications    aspirin EC 325 MG tablet Take 1 tablet (325 mg total) by mouth daily.   azelastine  0.1 % nasal spray Commonly known as: ASTELIN Place 1 spray into both nostrils 2 (two) times daily. Use in each nostril as directed What changed:  when to take this reasons to take this   benazepril 40 MG tablet Commonly known as: LOTENSIN TAKE 1 TABLET(40 MG) BY MOUTH AT BEDTIME   busPIRone 5 MG tablet Commonly known as: BUSPAR Take 1 tablet (5 mg total) by mouth 3 (three) times daily as needed. What changed: reasons to take this   gabapentin 100 MG capsule Commonly known as: NEURONTIN TAKE 1 CAPSULE BY MOUTH THREE TIMES DAILY What changed: when to take this   pramipexole 0.5 MG tablet Commonly known as: MIRAPEX TAKE 1 TABLET(0.5 MG) BY MOUTH AT BEDTIME   pravastatin 40 MG tablet Commonly known as: PRAVACHOL TAKE 1 TABLET BY MOUTH DAILY What changed:  when to take this additional instructions    spironolactone 25 MG tablet Commonly known as: ALDACTONE TAKE 1 TABLET(25 MG) BY MOUTH AT BEDTIME   traMADol 50 MG tablet Commonly known as: ULTRAM Take 1 tablet (50 mg total) by mouth every 6 (six) hours as needed for moderate pain or severe pain.               Durable Medical Equipment  (From admission, onward)           Start     Ordered   05/14/22 0911  DME 3-in-1  Once        05/14/22 0912   05/14/22 [id number]  DME Walker  Once       Question Answer Comment  Walker: With 5 Inch Wheels   Patient needs a walker to treat with the following condition Intertrochanteric fracture of left hip      05/14/22 0912              Discharge Care Instructions  (From admission, onward)           Start     Ordered   05/12/22 0000  Weight bearing as tolerated       Question Answer Comment  Laterality left   Extremity Lower      05/12/22 0819            Diagnostic Studies: DG FEMUR MIN 2 VIEWS LEFT  Result Date: 05/11/2022 CLINICAL DATA:  Left intratrochanteric fracture EXAM: LEFT FEMUR 2 VIEWS COMPARISON:  Films from earlier in the same day. FLUOROSCOPY TIME:  Radiation Exposure Index (as provided by the fluoroscopic device): 8.19 mGy If the device does not provide the exposure index: Fluoroscopy Time:  54 seconds Number of Acquired Images:  6 FINDINGS: Initial images demonstrate reduction of the intratrochanteric fracture. Medullary rod was then placed with proximal and distal fixation screws. IMPRESSION: ORIF of left intratrochanteric fracture. Electronically Signed   By: Inez Catalina M.D.   On: 05/11/2022 19:52   DG C-Arm 1-60 Min-No Report  Result Date: 05/11/2022 Fluoroscopy was utilized by the requesting physician.  No radiographic interpretation.   CT HEAD WO CONTRAST  Result Date: 05/11/2022 CLINICAL DATA:  Trauma, fall EXAM: CT HEAD WITHOUT CONTRAST TECHNIQUE: Contiguous axial images were obtained from the base of the skull through the vertex without  intravenous contrast. RADIATION DOSE REDUCTION: This exam was performed according to the departmental dose-optimization program which includes automated exposure control, adjustment of the mA and/or kV according to patient size and/or use of iterative reconstruction technique. COMPARISON:  Images of previous study done on 08/02/2015 not available for review. Report for the previous study  was reviewed. FINDINGS: Brain: No acute intracranial findings are seen. There are no signs of bleeding within the cranium. Cortical sulci are prominent. There is decreased density in periventricular and subcortical white matter. Vascular: Scattered arterial calcifications are seen. Skull: No acute findings are seen. Sinuses/Orbits: There are no air-fluid levels in paranasal sinuses. Other: None. IMPRESSION: No acute intracranial findings are seen. Atrophy. Small vessel disease. Electronically Signed   By: Elmer Picker M.D.   On: 05/11/2022 11:10   DG FEMUR MIN 2 VIEWS LEFT  Result Date: 05/11/2022 CLINICAL DATA:  [place], left femur pain EXAM: LEFT FEMUR 2 VIEWS COMPARISON:  None Available. FINDINGS: Frontal and cross-table lateral views of the left femur are obtained. There is a comminuted intertrochanteric left hip fracture, with mild impaction as well as ventral and varus angulation at the fracture site. No dislocation. There is a medial compartmental left knee arthroplasty. Cannulated screws and washers traverse the tibial plateau. Prior healed left superior and inferior pubic rami fractures are noted. There is soft tissue swelling throughout the left thigh. IMPRESSION: 1. Acute comminuted intertrochanteric left hip fracture, with impaction as well as varus and ventral angulation at the fracture site. No dislocation. 2. Unremarkable medial compartmental left knee partial arthroplasty. 3. Chronic healed left superior and inferior pubic rami fractures. 4. Postsurgical changes at the left tibial plateau as above.  Electronically Signed   By: Randa Ngo M.D.   On: 05/11/2022 10:49   DG Chest 1 View  Result Date: 05/11/2022 CLINICAL DATA:  [place], left femur pain EXAM: CHEST  1 VIEW COMPARISON:  11/15/2018 FINDINGS: Single frontal view of the chest demonstrates mild enlargement the cardiac silhouette, not appreciably changed since prior exam. Central vascular congestion without airspace disease, effusion, or pneumothorax. No acute bony abnormalities. IMPRESSION: 1. Central vascular congestion without evidence of edema. 2. Mild enlargement the cardiac silhouette. Electronically Signed   By: Randa Ngo M.D.   On: 05/11/2022 10:47    Disposition: Discharge disposition: 03-Skilled Nursing Facility       Discharge Instructions     Call MD for:  difficulty breathing, headache or visual disturbances   Complete by: As directed    Call MD for:  persistant nausea and vomiting   Complete by: As directed    Call MD for:  severe uncontrolled pain   Complete by: As directed    Call MD for:  temperature >100.4   Complete by: As directed    Diet - low sodium heart healthy   Complete by: As directed    Discharge instructions   Complete by: As directed    [place] items at home which could result in a fall. This includes throw rugs or furniture in walking pathways ICE to the affected joint every three hours while awake for 30 minutes at a time, for at least the first 3-5 days, and then as needed for pain and swelling.  Continue to use ice for pain and swelling. You may notice swelling that will progress down to the foot and ankle.  This is normal after surgery.  Elevate your leg when you are not up walking on it.   Continue to use the breathing machine you got in the hospital (incentive spirometer) which will help keep your temperature down.  It is common for your temperature to cycle up and down following surgery, especially at night when you are not up moving around and  exerting yourself.  The breathing machine keeps your lungs  expanded and your temperature down.   DIET:  As you were doing prior to hospitalization, we recommend a well-balanced diet.  DRESSING / WOUND CARE / SHOWERING  You may change your dressing 3-5 days after surgery.  Then change the dressing every day with sterile gauze.  Please use good hand washing techniques before changing the dressing.  Do not use any lotions or creams on the incision until instructed by your surgeon.  ACTIVITY  Increase activity slowly as tolerated, but follow the weight bearing instructions below.   No driving for 6 weeks or until further direction given by your physician.  You cannot drive while taking narcotics.  No lifting or carrying greater than 10 lbs. until further directed by your surgeon. Avoid periods of inactivity such as sitting longer than an hour when not asleep. This helps prevent blood clots.  You may return to work once you are authorized by your doctor.     WEIGHT BEARING   Weight bearing as tolerated with assist device (walker, cane, etc) as directed, use it as long as suggested by your surgeon or therapist, typically at least 4-6 weeks.   EXERCISES  Results after joint replacement surgery are often greatly improved when you follow the exercise, range of motion and muscle strengthening exercises prescribed by your doctor. Safety measures are also important to protect the joint from further injury. Any time any of these exercises cause you to have increased pain or swelling, decrease what you are doing until you are comfortable again and then slowly increase them. If you have problems or questions, call your caregiver or physical therapist for advice.   Rehabilitation is important following a joint replacement. After just a few days of immobilization, the muscles of the leg can become weakened and shrink (atrophy).  These exercises are designed to build up the tone and strength of the thigh  and leg muscles and to improve motion. Often times heat used for twenty to thirty minutes before working out will loosen up your tissues and help with improving the range of motion but do not use heat for the first two weeks following surgery (sometimes heat can increase post-operative swelling).   These exercises can be done on a training (exercise) mat, on the floor, on a table or on a bed. Use whatever works the best and is most comfortable for you.    Use music or television while you are exercising so that the exercises are a pleasant break in your day. This will make your life better with the exercises acting as a break in your routine that you can look forward to.   Perform all exercises about fifteen times, three times per day or as directed.  You should exercise both the operative leg and the other leg as well.  Exercises include:   Quad Sets - Tighten up the muscle on the front of the thigh (Quad) and hold for 5-10 seconds.   Straight Leg Raises - With your knee straight (if you were given a brace, keep it on), lift the leg to 60 degrees, hold for 3 seconds, and slowly lower the leg.  Perform this exercise against resistance later as your leg gets stronger.  Leg Slides: Lying on your back, slowly slide your foot toward your buttocks, bending your knee up off the floor (only go as far as is comfortable). Then slowly slide your foot back down until your leg is flat on the floor again.  Angel Wings: Lying on your back spread  your legs to the side as far apart as you can without causing discomfort.  Hamstring Strength:  Lying on your back, push your heel against the floor with your leg straight by tightening up the muscles of your buttocks.  Repeat, but this time bend your knee to a comfortable angle, and push your heel against the floor.  You may put a pillow under the heel to make it more comfortable if necessary.   A rehabilitation program following joint replacement surgery can speed recovery  and prevent re-injury in the future due to weakened muscles. Contact your doctor or a physical therapist for more information on knee rehabilitation.    CONSTIPATION  Constipation is defined medically as fewer than three stools per week and severe constipation as less than one stool per week.  Even if you have a regular bowel pattern at home, your normal regimen is likely to be disrupted due to multiple reasons following surgery.  Combination of anesthesia, postoperative narcotics, change in appetite and fluid intake all can affect your bowels.   YOU MUST use at least one of the following options; they are listed in order of increasing strength to get the job done.  They are all available over the counter, and you may need to use some, POSSIBLY even all of these options:    Drink plenty of fluids (prune juice may be helpful) and high fiber foods Colace 100 mg by mouth twice a day  Senokot for constipation as directed and as needed Dulcolax (bisacodyl), take with full glass of water  Miralax (polyethylene glycol) once or twice a day as needed.  If you have tried all these things and are unable to have a bowel movement in the first 3-4 days after surgery call either your surgeon or your primary doctor.    If you experience loose stools or diarrhea, hold the medications until you stool forms back up.  If your symptoms do not get better within 1 week or if they get worse, check with your doctor.  If you experience "the worst abdominal pain ever" or develop nausea or vomiting, please contact the office immediately for further recommendations for treatment.   ITCHING:  If you experience itching with your medications, try taking only a single pain pill, or even half a pain pill at a time.  You can also use Benadryl over the counter for itching or also to help with sleep.   TED HOSE STOCKINGS:  Use stockings on both legs until for at least 2 weeks or as directed by physician office. They may be removed  at night for sleeping.  MEDICATIONS:  See your medication summary on the "After Visit Summary" that nursing will review with you.  You may have some home medications which will be placed on hold until you complete the course of blood thinner medication.  It is important for you to complete the blood thinner medication as prescribed.  PRECAUTIONS:  If you experience chest pain or shortness of breath - call 911 immediately for transfer to the hospital emergency department.   If you develop a fever greater that 101 F, purulent drainage from wound, increased redness or drainage from wound, foul odor from the wound/dressing, or calf pain - CONTACT YOUR SURGEON.  FOLLOW-UP APPOINTMENTS:  If you do not already have a post-op appointment, please call the office for an appointment to be seen by your surgeon.  Guidelines for how soon to be seen are listed in your "After Visit Summary", but are typically between 1-4 weeks after surgery.  OTHER INSTRUCTIONS:   Knee Replacement:  Do not place pillow under knee, focus on keeping the knee straight while resting. CPM instructions: 0-90 degrees, 2 hours in the morning, 2 hours in the afternoon, and 2 hours in the evening. Place foam block, curve side up under heel at all times except when in CPM or when walking.  DO NOT modify, tear, cut, or change the foam block in any way.  POST-OPERATIVE OPIOID TAPER INSTRUCTIONS: It is important to wean off of your opioid medication as soon as possible. If you do not need pain medication after your surgery it is ok to stop day one. Opioids include: Codeine, Hydrocodone(Norco, Vicodin), Oxycodone(Percocet, oxycontin) and hydromorphone amongst others.  Long term and even short term use of opiods can cause: Increased pain response Dependence Constipation Depression Respiratory depression And more.  Withdrawal symptoms can include Flu like symptoms Nausea, vomiting And  more Techniques to manage these symptoms Hydrate well Eat regular healthy meals Stay active Use relaxation techniques(deep breathing, meditating, yoga) Do Not substitute Alcohol to help with tapering If you have been on opioids for less than two weeks and do not have pain than it is ok to stop all together.  Plan to wean off of opioids This plan should start within one week post op of your joint replacement. Maintain the same interval or time between taking each dose and first decrease the dose.  Cut the total daily intake of opioids by one tablet each day Next start to increase the time between doses. The last dose that should be eliminated is the evening dose.     MAKE SURE YOU:  Understand these instructions.  Get help right away if you are not doing well or get worse.    Thank you for letting us be a part of your medical care team.  It is a privilege we respect greatly.  We hope these instructions will help you stay on track for a fast and full recovery!   Increase activity slowly   Complete by: As directed    Weight bearing as tolerated   Complete by: As directed    Laterality: left   Extremity: Lower        Contact information for follow-up providers     Tania Ade, MD. Schedule an appointment as soon as possible for a visit on 05/26/2022.   Specialty: Orthopedic Surgery Contact information: [address] [place] [address] [phone]              Contact information for after-discharge care     Destination     [facility] .   Service: Skilled Nursing Contact information: [address] [place] [phone]                      Signed: [facility] 05/14/2022, 9:12 AM

## 2022-05-14 NOTE — Progress Notes (Signed)
Subjective: 3 Days Post-Op Procedure(s) (LRB): INTRAMEDULLARY (IM) NAIL INTERTROCHANTERIC (Left) Gina Bishop was resting comfortably in bed this morning.  She reported some increase in pain last night rated as 7 on 0-10 scale.  Pain was well-controlled medication.  She states she is doing fine.  She has been working with physical therapy and is beginning to ambulate some with assistance.  She is eating well and is passing gas.  Objective: Vital signs in last 24 hours: Temp:  [98.4 F (36.9 C)-98.7 F (37.1 C)] 98.4 F (36.9 C) (03/29 0455) Pulse Rate:  [56-77] 76 (03/29 0455) Resp:  [24] 24 (03/28 1357) BP: (130-136)/(61-88) 136/74 (03/29 0455) SpO2:  [93 %-98 %] 93 % (03/29 0455)  Intake/Output from previous day: 03/28 0701 - 03/29 0700 In: 595 [P.O.:595] Out: 600 [Urine:600] Intake/Output this shift: No intake/output data recorded.  Recent Labs    05/11/22 0957 05/12/22 0353 05/13/22 0356  HGB 14.5 10.2* 9.5*   Recent Labs    05/12/22 0353 05/13/22 0356  WBC 11.5* 9.6  RBC 3.36* 3.06*  HCT 31.0* 28.2*  PLT 145* 144*   Recent Labs    05/11/22 0957 05/12/22 0353  NA 133* 130*  K 4.5 4.5  CL 99 102  CO2 24 21*  BUN 20 19  CREATININE 0.81 0.79  GLUCOSE 123* 124*  CALCIUM 9.3 8.1*   Recent Labs    05/11/22 0957  INR 1.0   Neurologically intact ABD soft Neurovascular intact Sensation intact distally Intact pulses distally Dorsiflexion/Plantar flexion intact Incision: dressing C/D/I and no drainage Gina Bishop was able to wiggle her toes without difficulty.  Sensation was symmetrical.  Pulses 2+ and symmetrical.  She had some challenge lifting the leg off the bed secondary to pain symptoms.  We discussed exercises that she can do in bed both here and after she is transferred to a SNF.   Assessment/Plan: 3 Days Post-Op Procedure(s) (LRB): INTRAMEDULLARY (IM) NAIL INTERTROCHANTERIC (Left) Advance diet Up with therapy D/C IV fluids Discharge to  SNF   Anticipated LOS equal to or greater than 2 midnights due to - Age 87 and older with one or more of the following: - Obesity - Expected need for hospital services (PT, OT, Nursing) required for safe  discharge - Anticipated need for postoperative skilled nursing care or inpatient rehab - Unanticipated findings during/Post Surgery: Slow post-op progression: GI, pain control, mobility  - Patient is a high risk of re-admission due to: Barriers to post-acute care (logistical, no family support in home)  Patient doing well 3 days status post IM nailing of the left femur.  He had a syncopal episode during physical therapy yesterday but is doing well.  Her pain is well-controlled with medication.  She is progressing well at this point and from an orthopedic perspective, is ready for discharge to a SNF.   Dereck Leep 05/14/2022, 8:56 AM

## 2022-05-18 DIAGNOSIS — S72002D Fracture of unspecified part of neck of left femur, subsequent encounter for closed fracture with routine healing: Secondary | ICD-10-CM | POA: Diagnosis not present

## 2022-05-18 DIAGNOSIS — G2581 Restless legs syndrome: Secondary | ICD-10-CM | POA: Diagnosis not present

## 2022-05-19 DIAGNOSIS — D62 Acute posthemorrhagic anemia: Secondary | ICD-10-CM | POA: Diagnosis not present

## 2022-05-19 DIAGNOSIS — F419 Anxiety disorder, unspecified: Secondary | ICD-10-CM | POA: Diagnosis not present

## 2022-05-19 DIAGNOSIS — K5900 Constipation, unspecified: Secondary | ICD-10-CM | POA: Diagnosis not present

## 2022-05-19 DIAGNOSIS — I10 Essential (primary) hypertension: Secondary | ICD-10-CM | POA: Diagnosis not present

## 2022-05-19 DIAGNOSIS — S72002D Fracture of unspecified part of neck of left femur, subsequent encounter for closed fracture with routine healing: Secondary | ICD-10-CM | POA: Diagnosis not present

## 2022-05-19 DIAGNOSIS — G2581 Restless legs syndrome: Secondary | ICD-10-CM | POA: Diagnosis not present

## 2022-05-19 DIAGNOSIS — E785 Hyperlipidemia, unspecified: Secondary | ICD-10-CM | POA: Diagnosis not present

## 2022-05-24 DIAGNOSIS — S72002D Fracture of unspecified part of neck of left femur, subsequent encounter for closed fracture with routine healing: Secondary | ICD-10-CM | POA: Diagnosis not present

## 2022-05-28 DIAGNOSIS — N390 Urinary tract infection, site not specified: Secondary | ICD-10-CM | POA: Diagnosis not present

## 2022-05-28 DIAGNOSIS — E785 Hyperlipidemia, unspecified: Secondary | ICD-10-CM | POA: Diagnosis not present

## 2022-05-28 DIAGNOSIS — I10 Essential (primary) hypertension: Secondary | ICD-10-CM | POA: Diagnosis not present

## 2022-05-28 DIAGNOSIS — S72002D Fracture of unspecified part of neck of left femur, subsequent encounter for closed fracture with routine healing: Secondary | ICD-10-CM | POA: Diagnosis not present

## 2022-05-31 DIAGNOSIS — N390 Urinary tract infection, site not specified: Secondary | ICD-10-CM | POA: Diagnosis not present

## 2022-05-31 DIAGNOSIS — I10 Essential (primary) hypertension: Secondary | ICD-10-CM | POA: Diagnosis not present

## 2022-05-31 DIAGNOSIS — E785 Hyperlipidemia, unspecified: Secondary | ICD-10-CM | POA: Diagnosis not present

## 2022-05-31 DIAGNOSIS — G2581 Restless legs syndrome: Secondary | ICD-10-CM | POA: Diagnosis not present

## 2022-05-31 DIAGNOSIS — S72002D Fracture of unspecified part of neck of left femur, subsequent encounter for closed fracture with routine healing: Secondary | ICD-10-CM | POA: Diagnosis not present

## 2022-06-10 DIAGNOSIS — I10 Essential (primary) hypertension: Secondary | ICD-10-CM | POA: Diagnosis not present

## 2022-06-10 DIAGNOSIS — S72002D Fracture of unspecified part of neck of left femur, subsequent encounter for closed fracture with routine healing: Secondary | ICD-10-CM | POA: Diagnosis not present

## 2022-06-10 DIAGNOSIS — E785 Hyperlipidemia, unspecified: Secondary | ICD-10-CM | POA: Diagnosis not present

## 2022-06-10 DIAGNOSIS — N390 Urinary tract infection, site not specified: Secondary | ICD-10-CM | POA: Diagnosis not present

## 2022-06-10 DIAGNOSIS — G2581 Restless legs syndrome: Secondary | ICD-10-CM | POA: Diagnosis not present

## 2022-06-11 ENCOUNTER — Other Ambulatory Visit: Payer: Self-pay

## 2022-06-11 DIAGNOSIS — I10 Essential (primary) hypertension: Secondary | ICD-10-CM

## 2022-06-11 MED ORDER — BENAZEPRIL HCL 40 MG PO TABS: ORAL_TABLET | ORAL | 0 refills | Status: DC

## 2022-06-18 DIAGNOSIS — F419 Anxiety disorder, unspecified: Secondary | ICD-10-CM | POA: Diagnosis not present

## 2022-06-18 DIAGNOSIS — S72002D Fracture of unspecified part of neck of left femur, subsequent encounter for closed fracture with routine healing: Secondary | ICD-10-CM | POA: Diagnosis not present

## 2022-06-18 DIAGNOSIS — G2581 Restless legs syndrome: Secondary | ICD-10-CM | POA: Diagnosis not present

## 2022-06-18 DIAGNOSIS — I10 Essential (primary) hypertension: Secondary | ICD-10-CM | POA: Diagnosis not present

## 2022-06-18 DIAGNOSIS — E785 Hyperlipidemia, unspecified: Secondary | ICD-10-CM | POA: Diagnosis not present

## 2022-06-21 DIAGNOSIS — S72002D Fracture of unspecified part of neck of left femur, subsequent encounter for closed fracture with routine healing: Secondary | ICD-10-CM | POA: Diagnosis not present

## 2022-06-25 DIAGNOSIS — E785 Hyperlipidemia, unspecified: Secondary | ICD-10-CM | POA: Diagnosis not present

## 2022-06-25 DIAGNOSIS — S72002D Fracture of unspecified part of neck of left femur, subsequent encounter for closed fracture with routine healing: Secondary | ICD-10-CM | POA: Diagnosis not present

## 2022-06-25 DIAGNOSIS — I10 Essential (primary) hypertension: Secondary | ICD-10-CM | POA: Diagnosis not present

## 2022-06-25 DIAGNOSIS — D62 Acute posthemorrhagic anemia: Secondary | ICD-10-CM | POA: Diagnosis not present

## 2022-06-25 DIAGNOSIS — G2581 Restless legs syndrome: Secondary | ICD-10-CM | POA: Diagnosis not present

## 2022-06-29 DIAGNOSIS — S7292XA Unspecified fracture of left femur, initial encounter for closed fracture: Secondary | ICD-10-CM | POA: Diagnosis not present

## 2022-06-29 DIAGNOSIS — E7800 Pure hypercholesterolemia, unspecified: Secondary | ICD-10-CM | POA: Diagnosis not present

## 2022-06-29 DIAGNOSIS — J3089 Other allergic rhinitis: Secondary | ICD-10-CM | POA: Diagnosis not present

## 2022-06-29 DIAGNOSIS — M5459 Other low back pain: Secondary | ICD-10-CM | POA: Diagnosis not present

## 2022-06-29 DIAGNOSIS — G2581 Restless legs syndrome: Secondary | ICD-10-CM | POA: Diagnosis not present

## 2022-06-29 DIAGNOSIS — I10 Essential (primary) hypertension: Secondary | ICD-10-CM | POA: Diagnosis not present

## 2022-06-30 DIAGNOSIS — R296 Repeated falls: Secondary | ICD-10-CM | POA: Diagnosis not present

## 2022-06-30 DIAGNOSIS — R2689 Other abnormalities of gait and mobility: Secondary | ICD-10-CM | POA: Diagnosis not present

## 2022-06-30 DIAGNOSIS — M6259 Muscle wasting and atrophy, not elsewhere classified, multiple sites: Secondary | ICD-10-CM | POA: Diagnosis not present

## 2022-06-30 DIAGNOSIS — R278 Other lack of coordination: Secondary | ICD-10-CM | POA: Diagnosis not present

## 2022-07-02 DIAGNOSIS — R296 Repeated falls: Secondary | ICD-10-CM | POA: Diagnosis not present

## 2022-07-02 DIAGNOSIS — M6259 Muscle wasting and atrophy, not elsewhere classified, multiple sites: Secondary | ICD-10-CM | POA: Diagnosis not present

## 2022-07-02 DIAGNOSIS — R278 Other lack of coordination: Secondary | ICD-10-CM | POA: Diagnosis not present

## 2022-07-02 DIAGNOSIS — R2689 Other abnormalities of gait and mobility: Secondary | ICD-10-CM | POA: Diagnosis not present

## 2022-07-05 DIAGNOSIS — M6259 Muscle wasting and atrophy, not elsewhere classified, multiple sites: Secondary | ICD-10-CM | POA: Diagnosis not present

## 2022-07-05 DIAGNOSIS — R278 Other lack of coordination: Secondary | ICD-10-CM | POA: Diagnosis not present

## 2022-07-05 DIAGNOSIS — R296 Repeated falls: Secondary | ICD-10-CM | POA: Diagnosis not present

## 2022-07-05 DIAGNOSIS — R2689 Other abnormalities of gait and mobility: Secondary | ICD-10-CM | POA: Diagnosis not present

## 2022-07-07 DIAGNOSIS — M6259 Muscle wasting and atrophy, not elsewhere classified, multiple sites: Secondary | ICD-10-CM | POA: Diagnosis not present

## 2022-07-07 DIAGNOSIS — R278 Other lack of coordination: Secondary | ICD-10-CM | POA: Diagnosis not present

## 2022-07-07 DIAGNOSIS — R2689 Other abnormalities of gait and mobility: Secondary | ICD-10-CM | POA: Diagnosis not present

## 2022-07-07 DIAGNOSIS — R296 Repeated falls: Secondary | ICD-10-CM | POA: Diagnosis not present

## 2022-07-08 DIAGNOSIS — M6259 Muscle wasting and atrophy, not elsewhere classified, multiple sites: Secondary | ICD-10-CM | POA: Diagnosis not present

## 2022-07-08 DIAGNOSIS — R296 Repeated falls: Secondary | ICD-10-CM | POA: Diagnosis not present

## 2022-07-08 DIAGNOSIS — R278 Other lack of coordination: Secondary | ICD-10-CM | POA: Diagnosis not present

## 2022-07-08 DIAGNOSIS — R2689 Other abnormalities of gait and mobility: Secondary | ICD-10-CM | POA: Diagnosis not present

## 2022-07-12 DIAGNOSIS — M6259 Muscle wasting and atrophy, not elsewhere classified, multiple sites: Secondary | ICD-10-CM | POA: Diagnosis not present

## 2022-07-12 DIAGNOSIS — R278 Other lack of coordination: Secondary | ICD-10-CM | POA: Diagnosis not present

## 2022-07-12 DIAGNOSIS — R296 Repeated falls: Secondary | ICD-10-CM | POA: Diagnosis not present

## 2022-07-12 DIAGNOSIS — R2689 Other abnormalities of gait and mobility: Secondary | ICD-10-CM | POA: Diagnosis not present

## 2022-07-14 DIAGNOSIS — R296 Repeated falls: Secondary | ICD-10-CM | POA: Diagnosis not present

## 2022-07-14 DIAGNOSIS — M6259 Muscle wasting and atrophy, not elsewhere classified, multiple sites: Secondary | ICD-10-CM | POA: Diagnosis not present

## 2022-07-14 DIAGNOSIS — R278 Other lack of coordination: Secondary | ICD-10-CM | POA: Diagnosis not present

## 2022-07-14 DIAGNOSIS — R2689 Other abnormalities of gait and mobility: Secondary | ICD-10-CM | POA: Diagnosis not present

## 2022-07-16 DIAGNOSIS — M6259 Muscle wasting and atrophy, not elsewhere classified, multiple sites: Secondary | ICD-10-CM | POA: Diagnosis not present

## 2022-07-16 DIAGNOSIS — R278 Other lack of coordination: Secondary | ICD-10-CM | POA: Diagnosis not present

## 2022-07-16 DIAGNOSIS — R2689 Other abnormalities of gait and mobility: Secondary | ICD-10-CM | POA: Diagnosis not present

## 2022-07-16 DIAGNOSIS — R296 Repeated falls: Secondary | ICD-10-CM | POA: Diagnosis not present

## 2022-07-19 DIAGNOSIS — R2681 Unsteadiness on feet: Secondary | ICD-10-CM | POA: Diagnosis not present

## 2022-07-19 DIAGNOSIS — R278 Other lack of coordination: Secondary | ICD-10-CM | POA: Diagnosis not present

## 2022-07-19 DIAGNOSIS — R2689 Other abnormalities of gait and mobility: Secondary | ICD-10-CM | POA: Diagnosis not present

## 2022-07-19 DIAGNOSIS — R296 Repeated falls: Secondary | ICD-10-CM | POA: Diagnosis not present

## 2022-07-19 DIAGNOSIS — M6259 Muscle wasting and atrophy, not elsewhere classified, multiple sites: Secondary | ICD-10-CM | POA: Diagnosis not present

## 2022-07-20 DIAGNOSIS — R2689 Other abnormalities of gait and mobility: Secondary | ICD-10-CM | POA: Diagnosis not present

## 2022-07-20 DIAGNOSIS — M6259 Muscle wasting and atrophy, not elsewhere classified, multiple sites: Secondary | ICD-10-CM | POA: Diagnosis not present

## 2022-07-20 DIAGNOSIS — R278 Other lack of coordination: Secondary | ICD-10-CM | POA: Diagnosis not present

## 2022-07-20 DIAGNOSIS — R2681 Unsteadiness on feet: Secondary | ICD-10-CM | POA: Diagnosis not present

## 2022-07-20 DIAGNOSIS — R296 Repeated falls: Secondary | ICD-10-CM | POA: Diagnosis not present

## 2022-07-21 DIAGNOSIS — R296 Repeated falls: Secondary | ICD-10-CM | POA: Diagnosis not present

## 2022-07-21 DIAGNOSIS — R2689 Other abnormalities of gait and mobility: Secondary | ICD-10-CM | POA: Diagnosis not present

## 2022-07-21 DIAGNOSIS — M6259 Muscle wasting and atrophy, not elsewhere classified, multiple sites: Secondary | ICD-10-CM | POA: Diagnosis not present

## 2022-07-21 DIAGNOSIS — R278 Other lack of coordination: Secondary | ICD-10-CM | POA: Diagnosis not present

## 2022-07-21 DIAGNOSIS — R2681 Unsteadiness on feet: Secondary | ICD-10-CM | POA: Diagnosis not present

## 2022-07-22 DIAGNOSIS — M6259 Muscle wasting and atrophy, not elsewhere classified, multiple sites: Secondary | ICD-10-CM | POA: Diagnosis not present

## 2022-07-22 DIAGNOSIS — R2681 Unsteadiness on feet: Secondary | ICD-10-CM | POA: Diagnosis not present

## 2022-07-22 DIAGNOSIS — R2689 Other abnormalities of gait and mobility: Secondary | ICD-10-CM | POA: Diagnosis not present

## 2022-07-22 DIAGNOSIS — R296 Repeated falls: Secondary | ICD-10-CM | POA: Diagnosis not present

## 2022-07-22 DIAGNOSIS — R278 Other lack of coordination: Secondary | ICD-10-CM | POA: Diagnosis not present

## 2022-07-23 DIAGNOSIS — R2689 Other abnormalities of gait and mobility: Secondary | ICD-10-CM | POA: Diagnosis not present

## 2022-07-23 DIAGNOSIS — R296 Repeated falls: Secondary | ICD-10-CM | POA: Diagnosis not present

## 2022-07-23 DIAGNOSIS — R278 Other lack of coordination: Secondary | ICD-10-CM | POA: Diagnosis not present

## 2022-07-23 DIAGNOSIS — M6259 Muscle wasting and atrophy, not elsewhere classified, multiple sites: Secondary | ICD-10-CM | POA: Diagnosis not present

## 2022-07-23 DIAGNOSIS — R2681 Unsteadiness on feet: Secondary | ICD-10-CM | POA: Diagnosis not present

## 2022-07-26 DIAGNOSIS — R278 Other lack of coordination: Secondary | ICD-10-CM | POA: Diagnosis not present

## 2022-07-26 DIAGNOSIS — M6259 Muscle wasting and atrophy, not elsewhere classified, multiple sites: Secondary | ICD-10-CM | POA: Diagnosis not present

## 2022-07-26 DIAGNOSIS — R296 Repeated falls: Secondary | ICD-10-CM | POA: Diagnosis not present

## 2022-07-26 DIAGNOSIS — R2689 Other abnormalities of gait and mobility: Secondary | ICD-10-CM | POA: Diagnosis not present

## 2022-07-26 DIAGNOSIS — R2681 Unsteadiness on feet: Secondary | ICD-10-CM | POA: Diagnosis not present

## 2022-07-27 DIAGNOSIS — R2689 Other abnormalities of gait and mobility: Secondary | ICD-10-CM | POA: Diagnosis not present

## 2022-07-27 DIAGNOSIS — G2581 Restless legs syndrome: Secondary | ICD-10-CM | POA: Diagnosis not present

## 2022-07-27 DIAGNOSIS — K5909 Other constipation: Secondary | ICD-10-CM | POA: Diagnosis not present

## 2022-07-27 DIAGNOSIS — R296 Repeated falls: Secondary | ICD-10-CM | POA: Diagnosis not present

## 2022-07-27 DIAGNOSIS — R600 Localized edema: Secondary | ICD-10-CM | POA: Diagnosis not present

## 2022-07-27 DIAGNOSIS — M6259 Muscle wasting and atrophy, not elsewhere classified, multiple sites: Secondary | ICD-10-CM | POA: Diagnosis not present

## 2022-07-27 DIAGNOSIS — S7292XD Unspecified fracture of left femur, subsequent encounter for closed fracture with routine healing: Secondary | ICD-10-CM | POA: Diagnosis not present

## 2022-07-27 DIAGNOSIS — R278 Other lack of coordination: Secondary | ICD-10-CM | POA: Diagnosis not present

## 2022-07-27 DIAGNOSIS — R2681 Unsteadiness on feet: Secondary | ICD-10-CM | POA: Diagnosis not present

## 2022-07-28 DIAGNOSIS — R2681 Unsteadiness on feet: Secondary | ICD-10-CM | POA: Diagnosis not present

## 2022-07-28 DIAGNOSIS — R278 Other lack of coordination: Secondary | ICD-10-CM | POA: Diagnosis not present

## 2022-07-28 DIAGNOSIS — R2689 Other abnormalities of gait and mobility: Secondary | ICD-10-CM | POA: Diagnosis not present

## 2022-07-28 DIAGNOSIS — M6259 Muscle wasting and atrophy, not elsewhere classified, multiple sites: Secondary | ICD-10-CM | POA: Diagnosis not present

## 2022-07-28 DIAGNOSIS — R296 Repeated falls: Secondary | ICD-10-CM | POA: Diagnosis not present

## 2022-07-29 DIAGNOSIS — R2689 Other abnormalities of gait and mobility: Secondary | ICD-10-CM | POA: Diagnosis not present

## 2022-07-29 DIAGNOSIS — R278 Other lack of coordination: Secondary | ICD-10-CM | POA: Diagnosis not present

## 2022-07-29 DIAGNOSIS — R296 Repeated falls: Secondary | ICD-10-CM | POA: Diagnosis not present

## 2022-07-29 DIAGNOSIS — M6259 Muscle wasting and atrophy, not elsewhere classified, multiple sites: Secondary | ICD-10-CM | POA: Diagnosis not present

## 2022-07-29 DIAGNOSIS — R2681 Unsteadiness on feet: Secondary | ICD-10-CM | POA: Diagnosis not present

## 2022-07-30 DIAGNOSIS — R2681 Unsteadiness on feet: Secondary | ICD-10-CM | POA: Diagnosis not present

## 2022-07-30 DIAGNOSIS — R2689 Other abnormalities of gait and mobility: Secondary | ICD-10-CM | POA: Diagnosis not present

## 2022-07-30 DIAGNOSIS — R278 Other lack of coordination: Secondary | ICD-10-CM | POA: Diagnosis not present

## 2022-07-30 DIAGNOSIS — M6259 Muscle wasting and atrophy, not elsewhere classified, multiple sites: Secondary | ICD-10-CM | POA: Diagnosis not present

## 2022-07-30 DIAGNOSIS — R296 Repeated falls: Secondary | ICD-10-CM | POA: Diagnosis not present

## 2022-08-02 DIAGNOSIS — S72002D Fracture of unspecified part of neck of left femur, subsequent encounter for closed fracture with routine healing: Secondary | ICD-10-CM | POA: Diagnosis not present

## 2022-08-02 DIAGNOSIS — R2689 Other abnormalities of gait and mobility: Secondary | ICD-10-CM | POA: Diagnosis not present

## 2022-08-02 DIAGNOSIS — R2681 Unsteadiness on feet: Secondary | ICD-10-CM | POA: Diagnosis not present

## 2022-08-02 DIAGNOSIS — R278 Other lack of coordination: Secondary | ICD-10-CM | POA: Diagnosis not present

## 2022-08-02 DIAGNOSIS — M6259 Muscle wasting and atrophy, not elsewhere classified, multiple sites: Secondary | ICD-10-CM | POA: Diagnosis not present

## 2022-08-02 DIAGNOSIS — R296 Repeated falls: Secondary | ICD-10-CM | POA: Diagnosis not present

## 2022-08-03 DIAGNOSIS — R296 Repeated falls: Secondary | ICD-10-CM | POA: Diagnosis not present

## 2022-08-03 DIAGNOSIS — M6259 Muscle wasting and atrophy, not elsewhere classified, multiple sites: Secondary | ICD-10-CM | POA: Diagnosis not present

## 2022-08-03 DIAGNOSIS — R2681 Unsteadiness on feet: Secondary | ICD-10-CM | POA: Diagnosis not present

## 2022-08-03 DIAGNOSIS — R278 Other lack of coordination: Secondary | ICD-10-CM | POA: Diagnosis not present

## 2022-08-03 DIAGNOSIS — R2689 Other abnormalities of gait and mobility: Secondary | ICD-10-CM | POA: Diagnosis not present

## 2022-08-04 DIAGNOSIS — R2681 Unsteadiness on feet: Secondary | ICD-10-CM | POA: Diagnosis not present

## 2022-08-04 DIAGNOSIS — R296 Repeated falls: Secondary | ICD-10-CM | POA: Diagnosis not present

## 2022-08-04 DIAGNOSIS — R2689 Other abnormalities of gait and mobility: Secondary | ICD-10-CM | POA: Diagnosis not present

## 2022-08-04 DIAGNOSIS — M6259 Muscle wasting and atrophy, not elsewhere classified, multiple sites: Secondary | ICD-10-CM | POA: Diagnosis not present

## 2022-08-04 DIAGNOSIS — R278 Other lack of coordination: Secondary | ICD-10-CM | POA: Diagnosis not present

## 2022-08-05 DIAGNOSIS — M545 Low back pain, unspecified: Secondary | ICD-10-CM | POA: Diagnosis not present

## 2022-08-05 DIAGNOSIS — R2681 Unsteadiness on feet: Secondary | ICD-10-CM | POA: Diagnosis not present

## 2022-08-05 DIAGNOSIS — R278 Other lack of coordination: Secondary | ICD-10-CM | POA: Diagnosis not present

## 2022-08-05 DIAGNOSIS — M6259 Muscle wasting and atrophy, not elsewhere classified, multiple sites: Secondary | ICD-10-CM | POA: Diagnosis not present

## 2022-08-05 DIAGNOSIS — R350 Frequency of micturition: Secondary | ICD-10-CM | POA: Diagnosis not present

## 2022-08-05 DIAGNOSIS — R2689 Other abnormalities of gait and mobility: Secondary | ICD-10-CM | POA: Diagnosis not present

## 2022-08-05 DIAGNOSIS — R296 Repeated falls: Secondary | ICD-10-CM | POA: Diagnosis not present

## 2022-08-06 DIAGNOSIS — M6259 Muscle wasting and atrophy, not elsewhere classified, multiple sites: Secondary | ICD-10-CM | POA: Diagnosis not present

## 2022-08-06 DIAGNOSIS — R2681 Unsteadiness on feet: Secondary | ICD-10-CM | POA: Diagnosis not present

## 2022-08-06 DIAGNOSIS — R296 Repeated falls: Secondary | ICD-10-CM | POA: Diagnosis not present

## 2022-08-06 DIAGNOSIS — R278 Other lack of coordination: Secondary | ICD-10-CM | POA: Diagnosis not present

## 2022-08-06 DIAGNOSIS — R2689 Other abnormalities of gait and mobility: Secondary | ICD-10-CM | POA: Diagnosis not present

## 2022-08-09 DIAGNOSIS — R2689 Other abnormalities of gait and mobility: Secondary | ICD-10-CM | POA: Diagnosis not present

## 2022-08-09 DIAGNOSIS — R296 Repeated falls: Secondary | ICD-10-CM | POA: Diagnosis not present

## 2022-08-09 DIAGNOSIS — M6259 Muscle wasting and atrophy, not elsewhere classified, multiple sites: Secondary | ICD-10-CM | POA: Diagnosis not present

## 2022-08-09 DIAGNOSIS — R278 Other lack of coordination: Secondary | ICD-10-CM | POA: Diagnosis not present

## 2022-08-09 DIAGNOSIS — R2681 Unsteadiness on feet: Secondary | ICD-10-CM | POA: Diagnosis not present

## 2022-08-10 DIAGNOSIS — M6259 Muscle wasting and atrophy, not elsewhere classified, multiple sites: Secondary | ICD-10-CM | POA: Diagnosis not present

## 2022-08-10 DIAGNOSIS — I10 Essential (primary) hypertension: Secondary | ICD-10-CM | POA: Diagnosis not present

## 2022-08-10 DIAGNOSIS — R2681 Unsteadiness on feet: Secondary | ICD-10-CM | POA: Diagnosis not present

## 2022-08-10 DIAGNOSIS — R278 Other lack of coordination: Secondary | ICD-10-CM | POA: Diagnosis not present

## 2022-08-10 DIAGNOSIS — R296 Repeated falls: Secondary | ICD-10-CM | POA: Diagnosis not present

## 2022-08-10 DIAGNOSIS — M5459 Other low back pain: Secondary | ICD-10-CM | POA: Diagnosis not present

## 2022-08-10 DIAGNOSIS — R2689 Other abnormalities of gait and mobility: Secondary | ICD-10-CM | POA: Diagnosis not present

## 2022-08-10 DIAGNOSIS — R351 Nocturia: Secondary | ICD-10-CM | POA: Diagnosis not present

## 2022-08-10 DIAGNOSIS — R3915 Urgency of urination: Secondary | ICD-10-CM | POA: Diagnosis not present

## 2022-08-11 DIAGNOSIS — I10 Essential (primary) hypertension: Secondary | ICD-10-CM | POA: Diagnosis not present

## 2022-08-11 DIAGNOSIS — R2689 Other abnormalities of gait and mobility: Secondary | ICD-10-CM | POA: Diagnosis not present

## 2022-08-11 DIAGNOSIS — M545 Low back pain, unspecified: Secondary | ICD-10-CM | POA: Diagnosis not present

## 2022-08-11 DIAGNOSIS — Z9181 History of falling: Secondary | ICD-10-CM | POA: Diagnosis not present

## 2022-08-11 DIAGNOSIS — E782 Mixed hyperlipidemia: Secondary | ICD-10-CM | POA: Diagnosis not present

## 2022-08-11 DIAGNOSIS — Z87440 Personal history of urinary (tract) infections: Secondary | ICD-10-CM | POA: Diagnosis not present

## 2022-08-11 DIAGNOSIS — R296 Repeated falls: Secondary | ICD-10-CM | POA: Diagnosis not present

## 2022-08-11 DIAGNOSIS — R2681 Unsteadiness on feet: Secondary | ICD-10-CM | POA: Diagnosis not present

## 2022-08-11 DIAGNOSIS — R300 Dysuria: Secondary | ICD-10-CM | POA: Diagnosis not present

## 2022-08-11 DIAGNOSIS — M6259 Muscle wasting and atrophy, not elsewhere classified, multiple sites: Secondary | ICD-10-CM | POA: Diagnosis not present

## 2022-08-11 DIAGNOSIS — R278 Other lack of coordination: Secondary | ICD-10-CM | POA: Diagnosis not present

## 2022-08-11 DIAGNOSIS — G2581 Restless legs syndrome: Secondary | ICD-10-CM | POA: Diagnosis not present

## 2022-08-12 DIAGNOSIS — R278 Other lack of coordination: Secondary | ICD-10-CM | POA: Diagnosis not present

## 2022-08-12 DIAGNOSIS — R296 Repeated falls: Secondary | ICD-10-CM | POA: Diagnosis not present

## 2022-08-12 DIAGNOSIS — R2689 Other abnormalities of gait and mobility: Secondary | ICD-10-CM | POA: Diagnosis not present

## 2022-08-12 DIAGNOSIS — M6259 Muscle wasting and atrophy, not elsewhere classified, multiple sites: Secondary | ICD-10-CM | POA: Diagnosis not present

## 2022-08-12 DIAGNOSIS — R2681 Unsteadiness on feet: Secondary | ICD-10-CM | POA: Diagnosis not present

## 2022-08-13 DIAGNOSIS — R278 Other lack of coordination: Secondary | ICD-10-CM | POA: Diagnosis not present

## 2022-08-13 DIAGNOSIS — E7800 Pure hypercholesterolemia, unspecified: Secondary | ICD-10-CM | POA: Diagnosis not present

## 2022-08-13 DIAGNOSIS — R2689 Other abnormalities of gait and mobility: Secondary | ICD-10-CM | POA: Diagnosis not present

## 2022-08-13 DIAGNOSIS — I10 Essential (primary) hypertension: Secondary | ICD-10-CM | POA: Diagnosis not present

## 2022-08-13 DIAGNOSIS — R2681 Unsteadiness on feet: Secondary | ICD-10-CM | POA: Diagnosis not present

## 2022-08-13 DIAGNOSIS — M6259 Muscle wasting and atrophy, not elsewhere classified, multiple sites: Secondary | ICD-10-CM | POA: Diagnosis not present

## 2022-08-13 DIAGNOSIS — R296 Repeated falls: Secondary | ICD-10-CM | POA: Diagnosis not present

## 2022-08-16 DIAGNOSIS — R278 Other lack of coordination: Secondary | ICD-10-CM | POA: Diagnosis not present

## 2022-08-16 DIAGNOSIS — R2681 Unsteadiness on feet: Secondary | ICD-10-CM | POA: Diagnosis not present

## 2022-08-16 DIAGNOSIS — R296 Repeated falls: Secondary | ICD-10-CM | POA: Diagnosis not present

## 2022-08-16 DIAGNOSIS — R2689 Other abnormalities of gait and mobility: Secondary | ICD-10-CM | POA: Diagnosis not present

## 2022-08-16 DIAGNOSIS — M6259 Muscle wasting and atrophy, not elsewhere classified, multiple sites: Secondary | ICD-10-CM | POA: Diagnosis not present

## 2022-08-17 DIAGNOSIS — R278 Other lack of coordination: Secondary | ICD-10-CM | POA: Diagnosis not present

## 2022-08-17 DIAGNOSIS — M6259 Muscle wasting and atrophy, not elsewhere classified, multiple sites: Secondary | ICD-10-CM | POA: Diagnosis not present

## 2022-08-17 DIAGNOSIS — R296 Repeated falls: Secondary | ICD-10-CM | POA: Diagnosis not present

## 2022-08-17 DIAGNOSIS — R2681 Unsteadiness on feet: Secondary | ICD-10-CM | POA: Diagnosis not present

## 2022-08-17 DIAGNOSIS — R2689 Other abnormalities of gait and mobility: Secondary | ICD-10-CM | POA: Diagnosis not present

## 2022-08-18 DIAGNOSIS — R2689 Other abnormalities of gait and mobility: Secondary | ICD-10-CM | POA: Diagnosis not present

## 2022-08-18 DIAGNOSIS — R296 Repeated falls: Secondary | ICD-10-CM | POA: Diagnosis not present

## 2022-08-18 DIAGNOSIS — M6259 Muscle wasting and atrophy, not elsewhere classified, multiple sites: Secondary | ICD-10-CM | POA: Diagnosis not present

## 2022-08-18 DIAGNOSIS — R2681 Unsteadiness on feet: Secondary | ICD-10-CM | POA: Diagnosis not present

## 2022-08-18 DIAGNOSIS — R278 Other lack of coordination: Secondary | ICD-10-CM | POA: Diagnosis not present

## 2022-08-20 DIAGNOSIS — M6259 Muscle wasting and atrophy, not elsewhere classified, multiple sites: Secondary | ICD-10-CM | POA: Diagnosis not present

## 2022-08-20 DIAGNOSIS — R2681 Unsteadiness on feet: Secondary | ICD-10-CM | POA: Diagnosis not present

## 2022-08-20 DIAGNOSIS — R2689 Other abnormalities of gait and mobility: Secondary | ICD-10-CM | POA: Diagnosis not present

## 2022-08-20 DIAGNOSIS — R296 Repeated falls: Secondary | ICD-10-CM | POA: Diagnosis not present

## 2022-08-20 DIAGNOSIS — R278 Other lack of coordination: Secondary | ICD-10-CM | POA: Diagnosis not present

## 2022-08-23 DIAGNOSIS — M6259 Muscle wasting and atrophy, not elsewhere classified, multiple sites: Secondary | ICD-10-CM | POA: Diagnosis not present

## 2022-08-23 DIAGNOSIS — R2689 Other abnormalities of gait and mobility: Secondary | ICD-10-CM | POA: Diagnosis not present

## 2022-08-23 DIAGNOSIS — R278 Other lack of coordination: Secondary | ICD-10-CM | POA: Diagnosis not present

## 2022-08-23 DIAGNOSIS — R296 Repeated falls: Secondary | ICD-10-CM | POA: Diagnosis not present

## 2022-08-23 DIAGNOSIS — R2681 Unsteadiness on feet: Secondary | ICD-10-CM | POA: Diagnosis not present

## 2022-08-24 DIAGNOSIS — R2681 Unsteadiness on feet: Secondary | ICD-10-CM | POA: Diagnosis not present

## 2022-08-24 DIAGNOSIS — Z79899 Other long term (current) drug therapy: Secondary | ICD-10-CM | POA: Diagnosis not present

## 2022-08-24 DIAGNOSIS — R2689 Other abnormalities of gait and mobility: Secondary | ICD-10-CM | POA: Diagnosis not present

## 2022-08-24 DIAGNOSIS — I10 Essential (primary) hypertension: Secondary | ICD-10-CM | POA: Diagnosis not present

## 2022-08-24 DIAGNOSIS — R278 Other lack of coordination: Secondary | ICD-10-CM | POA: Diagnosis not present

## 2022-08-24 DIAGNOSIS — G2581 Restless legs syndrome: Secondary | ICD-10-CM | POA: Diagnosis not present

## 2022-08-24 DIAGNOSIS — K5909 Other constipation: Secondary | ICD-10-CM | POA: Diagnosis not present

## 2022-08-24 DIAGNOSIS — R296 Repeated falls: Secondary | ICD-10-CM | POA: Diagnosis not present

## 2022-08-24 DIAGNOSIS — M6259 Muscle wasting and atrophy, not elsewhere classified, multiple sites: Secondary | ICD-10-CM | POA: Diagnosis not present

## 2022-08-25 DIAGNOSIS — R296 Repeated falls: Secondary | ICD-10-CM | POA: Diagnosis not present

## 2022-08-25 DIAGNOSIS — R2681 Unsteadiness on feet: Secondary | ICD-10-CM | POA: Diagnosis not present

## 2022-08-25 DIAGNOSIS — R2689 Other abnormalities of gait and mobility: Secondary | ICD-10-CM | POA: Diagnosis not present

## 2022-08-25 DIAGNOSIS — M6259 Muscle wasting and atrophy, not elsewhere classified, multiple sites: Secondary | ICD-10-CM | POA: Diagnosis not present

## 2022-08-25 DIAGNOSIS — R278 Other lack of coordination: Secondary | ICD-10-CM | POA: Diagnosis not present

## 2022-08-26 DIAGNOSIS — R296 Repeated falls: Secondary | ICD-10-CM | POA: Diagnosis not present

## 2022-08-26 DIAGNOSIS — R2681 Unsteadiness on feet: Secondary | ICD-10-CM | POA: Diagnosis not present

## 2022-08-26 DIAGNOSIS — R2689 Other abnormalities of gait and mobility: Secondary | ICD-10-CM | POA: Diagnosis not present

## 2022-08-26 DIAGNOSIS — R278 Other lack of coordination: Secondary | ICD-10-CM | POA: Diagnosis not present

## 2022-08-26 DIAGNOSIS — M6259 Muscle wasting and atrophy, not elsewhere classified, multiple sites: Secondary | ICD-10-CM | POA: Diagnosis not present

## 2022-08-27 DIAGNOSIS — M6259 Muscle wasting and atrophy, not elsewhere classified, multiple sites: Secondary | ICD-10-CM | POA: Diagnosis not present

## 2022-08-27 DIAGNOSIS — R2689 Other abnormalities of gait and mobility: Secondary | ICD-10-CM | POA: Diagnosis not present

## 2022-08-27 DIAGNOSIS — R2681 Unsteadiness on feet: Secondary | ICD-10-CM | POA: Diagnosis not present

## 2022-08-27 DIAGNOSIS — R296 Repeated falls: Secondary | ICD-10-CM | POA: Diagnosis not present

## 2022-08-27 DIAGNOSIS — R278 Other lack of coordination: Secondary | ICD-10-CM | POA: Diagnosis not present

## 2022-08-29 DIAGNOSIS — R296 Repeated falls: Secondary | ICD-10-CM | POA: Diagnosis not present

## 2022-08-29 DIAGNOSIS — M6259 Muscle wasting and atrophy, not elsewhere classified, multiple sites: Secondary | ICD-10-CM | POA: Diagnosis not present

## 2022-08-29 DIAGNOSIS — R2681 Unsteadiness on feet: Secondary | ICD-10-CM | POA: Diagnosis not present

## 2022-08-29 DIAGNOSIS — R278 Other lack of coordination: Secondary | ICD-10-CM | POA: Diagnosis not present

## 2022-08-29 DIAGNOSIS — R2689 Other abnormalities of gait and mobility: Secondary | ICD-10-CM | POA: Diagnosis not present

## 2022-08-30 DIAGNOSIS — R2689 Other abnormalities of gait and mobility: Secondary | ICD-10-CM | POA: Diagnosis not present

## 2022-08-30 DIAGNOSIS — M6259 Muscle wasting and atrophy, not elsewhere classified, multiple sites: Secondary | ICD-10-CM | POA: Diagnosis not present

## 2022-08-30 DIAGNOSIS — R296 Repeated falls: Secondary | ICD-10-CM | POA: Diagnosis not present

## 2022-08-30 DIAGNOSIS — R2681 Unsteadiness on feet: Secondary | ICD-10-CM | POA: Diagnosis not present

## 2022-08-30 DIAGNOSIS — R278 Other lack of coordination: Secondary | ICD-10-CM | POA: Diagnosis not present

## 2022-08-31 DIAGNOSIS — M6259 Muscle wasting and atrophy, not elsewhere classified, multiple sites: Secondary | ICD-10-CM | POA: Diagnosis not present

## 2022-08-31 DIAGNOSIS — R2681 Unsteadiness on feet: Secondary | ICD-10-CM | POA: Diagnosis not present

## 2022-08-31 DIAGNOSIS — R296 Repeated falls: Secondary | ICD-10-CM | POA: Diagnosis not present

## 2022-08-31 DIAGNOSIS — R2689 Other abnormalities of gait and mobility: Secondary | ICD-10-CM | POA: Diagnosis not present

## 2022-08-31 DIAGNOSIS — R278 Other lack of coordination: Secondary | ICD-10-CM | POA: Diagnosis not present

## 2022-09-01 DIAGNOSIS — R2681 Unsteadiness on feet: Secondary | ICD-10-CM | POA: Diagnosis not present

## 2022-09-01 DIAGNOSIS — R296 Repeated falls: Secondary | ICD-10-CM | POA: Diagnosis not present

## 2022-09-01 DIAGNOSIS — M6259 Muscle wasting and atrophy, not elsewhere classified, multiple sites: Secondary | ICD-10-CM | POA: Diagnosis not present

## 2022-09-01 DIAGNOSIS — R278 Other lack of coordination: Secondary | ICD-10-CM | POA: Diagnosis not present

## 2022-09-01 DIAGNOSIS — R2689 Other abnormalities of gait and mobility: Secondary | ICD-10-CM | POA: Diagnosis not present

## 2022-09-02 DIAGNOSIS — R234 Changes in skin texture: Secondary | ICD-10-CM | POA: Diagnosis not present

## 2022-09-02 DIAGNOSIS — R278 Other lack of coordination: Secondary | ICD-10-CM | POA: Diagnosis not present

## 2022-09-02 DIAGNOSIS — L600 Ingrowing nail: Secondary | ICD-10-CM | POA: Diagnosis not present

## 2022-09-02 DIAGNOSIS — B351 Tinea unguium: Secondary | ICD-10-CM | POA: Diagnosis not present

## 2022-09-02 DIAGNOSIS — M2011 Hallux valgus (acquired), right foot: Secondary | ICD-10-CM | POA: Diagnosis not present

## 2022-09-02 DIAGNOSIS — R2681 Unsteadiness on feet: Secondary | ICD-10-CM | POA: Diagnosis not present

## 2022-09-02 DIAGNOSIS — M79671 Pain in right foot: Secondary | ICD-10-CM | POA: Diagnosis not present

## 2022-09-02 DIAGNOSIS — M79672 Pain in left foot: Secondary | ICD-10-CM | POA: Diagnosis not present

## 2022-09-02 DIAGNOSIS — M2042 Other hammer toe(s) (acquired), left foot: Secondary | ICD-10-CM | POA: Diagnosis not present

## 2022-09-02 DIAGNOSIS — I739 Peripheral vascular disease, unspecified: Secondary | ICD-10-CM | POA: Diagnosis not present

## 2022-09-02 DIAGNOSIS — R296 Repeated falls: Secondary | ICD-10-CM | POA: Diagnosis not present

## 2022-09-02 DIAGNOSIS — R600 Localized edema: Secondary | ICD-10-CM | POA: Diagnosis not present

## 2022-09-02 DIAGNOSIS — M6259 Muscle wasting and atrophy, not elsewhere classified, multiple sites: Secondary | ICD-10-CM | POA: Diagnosis not present

## 2022-09-02 DIAGNOSIS — M2012 Hallux valgus (acquired), left foot: Secondary | ICD-10-CM | POA: Diagnosis not present

## 2022-09-02 DIAGNOSIS — L601 Onycholysis: Secondary | ICD-10-CM | POA: Diagnosis not present

## 2022-09-02 DIAGNOSIS — R2689 Other abnormalities of gait and mobility: Secondary | ICD-10-CM | POA: Diagnosis not present

## 2022-09-02 DIAGNOSIS — M2041 Other hammer toe(s) (acquired), right foot: Secondary | ICD-10-CM | POA: Diagnosis not present

## 2022-09-03 DIAGNOSIS — R2689 Other abnormalities of gait and mobility: Secondary | ICD-10-CM | POA: Diagnosis not present

## 2022-09-03 DIAGNOSIS — R2681 Unsteadiness on feet: Secondary | ICD-10-CM | POA: Diagnosis not present

## 2022-09-03 DIAGNOSIS — R278 Other lack of coordination: Secondary | ICD-10-CM | POA: Diagnosis not present

## 2022-09-03 DIAGNOSIS — M6259 Muscle wasting and atrophy, not elsewhere classified, multiple sites: Secondary | ICD-10-CM | POA: Diagnosis not present

## 2022-09-03 DIAGNOSIS — R296 Repeated falls: Secondary | ICD-10-CM | POA: Diagnosis not present

## 2022-09-06 DIAGNOSIS — R2689 Other abnormalities of gait and mobility: Secondary | ICD-10-CM | POA: Diagnosis not present

## 2022-09-06 DIAGNOSIS — M6259 Muscle wasting and atrophy, not elsewhere classified, multiple sites: Secondary | ICD-10-CM | POA: Diagnosis not present

## 2022-09-06 DIAGNOSIS — R296 Repeated falls: Secondary | ICD-10-CM | POA: Diagnosis not present

## 2022-09-06 DIAGNOSIS — R278 Other lack of coordination: Secondary | ICD-10-CM | POA: Diagnosis not present

## 2022-09-06 DIAGNOSIS — R2681 Unsteadiness on feet: Secondary | ICD-10-CM | POA: Diagnosis not present

## 2022-09-07 DIAGNOSIS — R278 Other lack of coordination: Secondary | ICD-10-CM | POA: Diagnosis not present

## 2022-09-07 DIAGNOSIS — R2681 Unsteadiness on feet: Secondary | ICD-10-CM | POA: Diagnosis not present

## 2022-09-07 DIAGNOSIS — R2689 Other abnormalities of gait and mobility: Secondary | ICD-10-CM | POA: Diagnosis not present

## 2022-09-07 DIAGNOSIS — M6259 Muscle wasting and atrophy, not elsewhere classified, multiple sites: Secondary | ICD-10-CM | POA: Diagnosis not present

## 2022-09-07 DIAGNOSIS — R296 Repeated falls: Secondary | ICD-10-CM | POA: Diagnosis not present

## 2022-09-08 DIAGNOSIS — M6259 Muscle wasting and atrophy, not elsewhere classified, multiple sites: Secondary | ICD-10-CM | POA: Diagnosis not present

## 2022-09-08 DIAGNOSIS — R296 Repeated falls: Secondary | ICD-10-CM | POA: Diagnosis not present

## 2022-09-08 DIAGNOSIS — R2681 Unsteadiness on feet: Secondary | ICD-10-CM | POA: Diagnosis not present

## 2022-09-08 DIAGNOSIS — R2689 Other abnormalities of gait and mobility: Secondary | ICD-10-CM | POA: Diagnosis not present

## 2022-09-08 DIAGNOSIS — R278 Other lack of coordination: Secondary | ICD-10-CM | POA: Diagnosis not present

## 2022-09-09 DIAGNOSIS — R2689 Other abnormalities of gait and mobility: Secondary | ICD-10-CM | POA: Diagnosis not present

## 2022-09-09 DIAGNOSIS — M6259 Muscle wasting and atrophy, not elsewhere classified, multiple sites: Secondary | ICD-10-CM | POA: Diagnosis not present

## 2022-09-09 DIAGNOSIS — N3941 Urge incontinence: Secondary | ICD-10-CM | POA: Diagnosis not present

## 2022-09-09 DIAGNOSIS — R296 Repeated falls: Secondary | ICD-10-CM | POA: Diagnosis not present

## 2022-09-09 DIAGNOSIS — R278 Other lack of coordination: Secondary | ICD-10-CM | POA: Diagnosis not present

## 2022-09-09 DIAGNOSIS — R2681 Unsteadiness on feet: Secondary | ICD-10-CM | POA: Diagnosis not present

## 2022-09-09 DIAGNOSIS — R3914 Feeling of incomplete bladder emptying: Secondary | ICD-10-CM | POA: Diagnosis not present

## 2022-09-10 DIAGNOSIS — R278 Other lack of coordination: Secondary | ICD-10-CM | POA: Diagnosis not present

## 2022-09-10 DIAGNOSIS — R296 Repeated falls: Secondary | ICD-10-CM | POA: Diagnosis not present

## 2022-09-10 DIAGNOSIS — M6259 Muscle wasting and atrophy, not elsewhere classified, multiple sites: Secondary | ICD-10-CM | POA: Diagnosis not present

## 2022-09-10 DIAGNOSIS — R2689 Other abnormalities of gait and mobility: Secondary | ICD-10-CM | POA: Diagnosis not present

## 2022-09-10 DIAGNOSIS — R2681 Unsteadiness on feet: Secondary | ICD-10-CM | POA: Diagnosis not present

## 2022-09-13 DIAGNOSIS — R278 Other lack of coordination: Secondary | ICD-10-CM | POA: Diagnosis not present

## 2022-09-13 DIAGNOSIS — M6259 Muscle wasting and atrophy, not elsewhere classified, multiple sites: Secondary | ICD-10-CM | POA: Diagnosis not present

## 2022-09-13 DIAGNOSIS — R296 Repeated falls: Secondary | ICD-10-CM | POA: Diagnosis not present

## 2022-09-13 DIAGNOSIS — R2689 Other abnormalities of gait and mobility: Secondary | ICD-10-CM | POA: Diagnosis not present

## 2022-09-13 DIAGNOSIS — R2681 Unsteadiness on feet: Secondary | ICD-10-CM | POA: Diagnosis not present

## 2022-09-14 DIAGNOSIS — R296 Repeated falls: Secondary | ICD-10-CM | POA: Diagnosis not present

## 2022-09-14 DIAGNOSIS — M6259 Muscle wasting and atrophy, not elsewhere classified, multiple sites: Secondary | ICD-10-CM | POA: Diagnosis not present

## 2022-09-14 DIAGNOSIS — R278 Other lack of coordination: Secondary | ICD-10-CM | POA: Diagnosis not present

## 2022-09-14 DIAGNOSIS — R2689 Other abnormalities of gait and mobility: Secondary | ICD-10-CM | POA: Diagnosis not present

## 2022-09-14 DIAGNOSIS — R2681 Unsteadiness on feet: Secondary | ICD-10-CM | POA: Diagnosis not present

## 2022-09-16 DIAGNOSIS — R278 Other lack of coordination: Secondary | ICD-10-CM | POA: Diagnosis not present

## 2022-09-16 DIAGNOSIS — R2681 Unsteadiness on feet: Secondary | ICD-10-CM | POA: Diagnosis not present

## 2022-09-16 DIAGNOSIS — R296 Repeated falls: Secondary | ICD-10-CM | POA: Diagnosis not present

## 2022-09-16 DIAGNOSIS — R2689 Other abnormalities of gait and mobility: Secondary | ICD-10-CM | POA: Diagnosis not present

## 2022-09-16 DIAGNOSIS — M6259 Muscle wasting and atrophy, not elsewhere classified, multiple sites: Secondary | ICD-10-CM | POA: Diagnosis not present

## 2022-09-17 DIAGNOSIS — R296 Repeated falls: Secondary | ICD-10-CM | POA: Diagnosis not present

## 2022-09-17 DIAGNOSIS — R278 Other lack of coordination: Secondary | ICD-10-CM | POA: Diagnosis not present

## 2022-09-17 DIAGNOSIS — R2681 Unsteadiness on feet: Secondary | ICD-10-CM | POA: Diagnosis not present

## 2022-09-17 DIAGNOSIS — M6259 Muscle wasting and atrophy, not elsewhere classified, multiple sites: Secondary | ICD-10-CM | POA: Diagnosis not present

## 2022-09-17 DIAGNOSIS — R2689 Other abnormalities of gait and mobility: Secondary | ICD-10-CM | POA: Diagnosis not present

## 2022-09-20 DIAGNOSIS — R278 Other lack of coordination: Secondary | ICD-10-CM | POA: Diagnosis not present

## 2022-09-20 DIAGNOSIS — R296 Repeated falls: Secondary | ICD-10-CM | POA: Diagnosis not present

## 2022-09-20 DIAGNOSIS — R2681 Unsteadiness on feet: Secondary | ICD-10-CM | POA: Diagnosis not present

## 2022-09-20 DIAGNOSIS — M6259 Muscle wasting and atrophy, not elsewhere classified, multiple sites: Secondary | ICD-10-CM | POA: Diagnosis not present

## 2022-09-20 DIAGNOSIS — R2689 Other abnormalities of gait and mobility: Secondary | ICD-10-CM | POA: Diagnosis not present

## 2022-09-21 DIAGNOSIS — R296 Repeated falls: Secondary | ICD-10-CM | POA: Diagnosis not present

## 2022-09-21 DIAGNOSIS — R2681 Unsteadiness on feet: Secondary | ICD-10-CM | POA: Diagnosis not present

## 2022-09-21 DIAGNOSIS — M6259 Muscle wasting and atrophy, not elsewhere classified, multiple sites: Secondary | ICD-10-CM | POA: Diagnosis not present

## 2022-09-21 DIAGNOSIS — R2689 Other abnormalities of gait and mobility: Secondary | ICD-10-CM | POA: Diagnosis not present

## 2022-09-21 DIAGNOSIS — R278 Other lack of coordination: Secondary | ICD-10-CM | POA: Diagnosis not present

## 2022-09-22 DIAGNOSIS — M6259 Muscle wasting and atrophy, not elsewhere classified, multiple sites: Secondary | ICD-10-CM | POA: Diagnosis not present

## 2022-09-22 DIAGNOSIS — R2689 Other abnormalities of gait and mobility: Secondary | ICD-10-CM | POA: Diagnosis not present

## 2022-09-22 DIAGNOSIS — R296 Repeated falls: Secondary | ICD-10-CM | POA: Diagnosis not present

## 2022-09-22 DIAGNOSIS — R2681 Unsteadiness on feet: Secondary | ICD-10-CM | POA: Diagnosis not present

## 2022-09-22 DIAGNOSIS — R278 Other lack of coordination: Secondary | ICD-10-CM | POA: Diagnosis not present

## 2022-09-23 DIAGNOSIS — R278 Other lack of coordination: Secondary | ICD-10-CM | POA: Diagnosis not present

## 2022-09-23 DIAGNOSIS — R2681 Unsteadiness on feet: Secondary | ICD-10-CM | POA: Diagnosis not present

## 2022-09-23 DIAGNOSIS — R2689 Other abnormalities of gait and mobility: Secondary | ICD-10-CM | POA: Diagnosis not present

## 2022-09-23 DIAGNOSIS — M6259 Muscle wasting and atrophy, not elsewhere classified, multiple sites: Secondary | ICD-10-CM | POA: Diagnosis not present

## 2022-09-23 DIAGNOSIS — R296 Repeated falls: Secondary | ICD-10-CM | POA: Diagnosis not present

## 2022-09-24 DIAGNOSIS — M6259 Muscle wasting and atrophy, not elsewhere classified, multiple sites: Secondary | ICD-10-CM | POA: Diagnosis not present

## 2022-09-24 DIAGNOSIS — R296 Repeated falls: Secondary | ICD-10-CM | POA: Diagnosis not present

## 2022-09-24 DIAGNOSIS — R2681 Unsteadiness on feet: Secondary | ICD-10-CM | POA: Diagnosis not present

## 2022-09-24 DIAGNOSIS — R278 Other lack of coordination: Secondary | ICD-10-CM | POA: Diagnosis not present

## 2022-09-24 DIAGNOSIS — R2689 Other abnormalities of gait and mobility: Secondary | ICD-10-CM | POA: Diagnosis not present

## 2022-09-27 DIAGNOSIS — R278 Other lack of coordination: Secondary | ICD-10-CM | POA: Diagnosis not present

## 2022-09-27 DIAGNOSIS — R2689 Other abnormalities of gait and mobility: Secondary | ICD-10-CM | POA: Diagnosis not present

## 2022-09-27 DIAGNOSIS — R296 Repeated falls: Secondary | ICD-10-CM | POA: Diagnosis not present

## 2022-09-27 DIAGNOSIS — M6259 Muscle wasting and atrophy, not elsewhere classified, multiple sites: Secondary | ICD-10-CM | POA: Diagnosis not present

## 2022-09-27 DIAGNOSIS — R2681 Unsteadiness on feet: Secondary | ICD-10-CM | POA: Diagnosis not present

## 2022-09-28 DIAGNOSIS — M6259 Muscle wasting and atrophy, not elsewhere classified, multiple sites: Secondary | ICD-10-CM | POA: Diagnosis not present

## 2022-09-28 DIAGNOSIS — R278 Other lack of coordination: Secondary | ICD-10-CM | POA: Diagnosis not present

## 2022-09-28 DIAGNOSIS — R2689 Other abnormalities of gait and mobility: Secondary | ICD-10-CM | POA: Diagnosis not present

## 2022-09-28 DIAGNOSIS — R296 Repeated falls: Secondary | ICD-10-CM | POA: Diagnosis not present

## 2022-09-28 DIAGNOSIS — R2681 Unsteadiness on feet: Secondary | ICD-10-CM | POA: Diagnosis not present

## 2022-09-29 DIAGNOSIS — R296 Repeated falls: Secondary | ICD-10-CM | POA: Diagnosis not present

## 2022-09-29 DIAGNOSIS — M6259 Muscle wasting and atrophy, not elsewhere classified, multiple sites: Secondary | ICD-10-CM | POA: Diagnosis not present

## 2022-09-29 DIAGNOSIS — R2689 Other abnormalities of gait and mobility: Secondary | ICD-10-CM | POA: Diagnosis not present

## 2022-09-29 DIAGNOSIS — R2681 Unsteadiness on feet: Secondary | ICD-10-CM | POA: Diagnosis not present

## 2022-09-29 DIAGNOSIS — R278 Other lack of coordination: Secondary | ICD-10-CM | POA: Diagnosis not present

## 2022-09-30 DIAGNOSIS — R2689 Other abnormalities of gait and mobility: Secondary | ICD-10-CM | POA: Diagnosis not present

## 2022-09-30 DIAGNOSIS — R278 Other lack of coordination: Secondary | ICD-10-CM | POA: Diagnosis not present

## 2022-09-30 DIAGNOSIS — R296 Repeated falls: Secondary | ICD-10-CM | POA: Diagnosis not present

## 2022-09-30 DIAGNOSIS — R2681 Unsteadiness on feet: Secondary | ICD-10-CM | POA: Diagnosis not present

## 2022-09-30 DIAGNOSIS — M6259 Muscle wasting and atrophy, not elsewhere classified, multiple sites: Secondary | ICD-10-CM | POA: Diagnosis not present

## 2022-10-04 DIAGNOSIS — R278 Other lack of coordination: Secondary | ICD-10-CM | POA: Diagnosis not present

## 2022-10-04 DIAGNOSIS — R296 Repeated falls: Secondary | ICD-10-CM | POA: Diagnosis not present

## 2022-10-04 DIAGNOSIS — R2681 Unsteadiness on feet: Secondary | ICD-10-CM | POA: Diagnosis not present

## 2022-10-04 DIAGNOSIS — M6259 Muscle wasting and atrophy, not elsewhere classified, multiple sites: Secondary | ICD-10-CM | POA: Diagnosis not present

## 2022-10-04 DIAGNOSIS — R2689 Other abnormalities of gait and mobility: Secondary | ICD-10-CM | POA: Diagnosis not present

## 2022-10-05 ENCOUNTER — Other Ambulatory Visit: Payer: Self-pay | Admitting: Family Medicine

## 2022-10-05 DIAGNOSIS — R2681 Unsteadiness on feet: Secondary | ICD-10-CM | POA: Diagnosis not present

## 2022-10-05 DIAGNOSIS — N3281 Overactive bladder: Secondary | ICD-10-CM | POA: Diagnosis not present

## 2022-10-05 DIAGNOSIS — G2581 Restless legs syndrome: Secondary | ICD-10-CM | POA: Diagnosis not present

## 2022-10-05 DIAGNOSIS — R296 Repeated falls: Secondary | ICD-10-CM | POA: Diagnosis not present

## 2022-10-05 DIAGNOSIS — I10 Essential (primary) hypertension: Secondary | ICD-10-CM

## 2022-10-05 DIAGNOSIS — R278 Other lack of coordination: Secondary | ICD-10-CM | POA: Diagnosis not present

## 2022-10-05 DIAGNOSIS — R2689 Other abnormalities of gait and mobility: Secondary | ICD-10-CM | POA: Diagnosis not present

## 2022-10-05 DIAGNOSIS — Z79899 Other long term (current) drug therapy: Secondary | ICD-10-CM | POA: Diagnosis not present

## 2022-10-05 DIAGNOSIS — M6259 Muscle wasting and atrophy, not elsewhere classified, multiple sites: Secondary | ICD-10-CM | POA: Diagnosis not present

## 2022-10-06 DIAGNOSIS — R296 Repeated falls: Secondary | ICD-10-CM | POA: Diagnosis not present

## 2022-10-06 DIAGNOSIS — M6259 Muscle wasting and atrophy, not elsewhere classified, multiple sites: Secondary | ICD-10-CM | POA: Diagnosis not present

## 2022-10-06 DIAGNOSIS — R2681 Unsteadiness on feet: Secondary | ICD-10-CM | POA: Diagnosis not present

## 2022-10-06 DIAGNOSIS — R278 Other lack of coordination: Secondary | ICD-10-CM | POA: Diagnosis not present

## 2022-10-06 DIAGNOSIS — R2689 Other abnormalities of gait and mobility: Secondary | ICD-10-CM | POA: Diagnosis not present

## 2022-10-07 DIAGNOSIS — R296 Repeated falls: Secondary | ICD-10-CM | POA: Diagnosis not present

## 2022-10-07 DIAGNOSIS — M6259 Muscle wasting and atrophy, not elsewhere classified, multiple sites: Secondary | ICD-10-CM | POA: Diagnosis not present

## 2022-10-07 DIAGNOSIS — R2681 Unsteadiness on feet: Secondary | ICD-10-CM | POA: Diagnosis not present

## 2022-10-07 DIAGNOSIS — R278 Other lack of coordination: Secondary | ICD-10-CM | POA: Diagnosis not present

## 2022-10-07 DIAGNOSIS — R2689 Other abnormalities of gait and mobility: Secondary | ICD-10-CM | POA: Diagnosis not present

## 2022-10-11 DIAGNOSIS — R278 Other lack of coordination: Secondary | ICD-10-CM | POA: Diagnosis not present

## 2022-10-11 DIAGNOSIS — R2681 Unsteadiness on feet: Secondary | ICD-10-CM | POA: Diagnosis not present

## 2022-10-11 DIAGNOSIS — R2689 Other abnormalities of gait and mobility: Secondary | ICD-10-CM | POA: Diagnosis not present

## 2022-10-11 DIAGNOSIS — M6259 Muscle wasting and atrophy, not elsewhere classified, multiple sites: Secondary | ICD-10-CM | POA: Diagnosis not present

## 2022-10-11 DIAGNOSIS — R296 Repeated falls: Secondary | ICD-10-CM | POA: Diagnosis not present

## 2022-10-15 DIAGNOSIS — R2689 Other abnormalities of gait and mobility: Secondary | ICD-10-CM | POA: Diagnosis not present

## 2022-10-15 DIAGNOSIS — M6259 Muscle wasting and atrophy, not elsewhere classified, multiple sites: Secondary | ICD-10-CM | POA: Diagnosis not present

## 2022-10-15 DIAGNOSIS — R2681 Unsteadiness on feet: Secondary | ICD-10-CM | POA: Diagnosis not present

## 2022-10-15 DIAGNOSIS — R296 Repeated falls: Secondary | ICD-10-CM | POA: Diagnosis not present

## 2022-10-15 DIAGNOSIS — R278 Other lack of coordination: Secondary | ICD-10-CM | POA: Diagnosis not present

## 2022-10-19 ENCOUNTER — Encounter: Payer: Self-pay | Admitting: Family Medicine

## 2022-10-19 ENCOUNTER — Ambulatory Visit: Payer: Medicare Other | Admitting: Family Medicine

## 2022-10-19 VITALS — BP 186/82 | HR 77 | Ht 65.0 in | Wt 159.4 lb

## 2022-10-19 DIAGNOSIS — Z23 Encounter for immunization: Secondary | ICD-10-CM

## 2022-10-19 DIAGNOSIS — R296 Repeated falls: Secondary | ICD-10-CM | POA: Diagnosis not present

## 2022-10-19 DIAGNOSIS — D649 Anemia, unspecified: Secondary | ICD-10-CM | POA: Diagnosis not present

## 2022-10-19 DIAGNOSIS — R278 Other lack of coordination: Secondary | ICD-10-CM | POA: Diagnosis not present

## 2022-10-19 DIAGNOSIS — G2581 Restless legs syndrome: Secondary | ICD-10-CM

## 2022-10-19 DIAGNOSIS — M8000XA Age-related osteoporosis with current pathological fracture, unspecified site, initial encounter for fracture: Secondary | ICD-10-CM

## 2022-10-19 DIAGNOSIS — D62 Acute posthemorrhagic anemia: Secondary | ICD-10-CM | POA: Diagnosis not present

## 2022-10-19 DIAGNOSIS — R2689 Other abnormalities of gait and mobility: Secondary | ICD-10-CM | POA: Diagnosis not present

## 2022-10-19 DIAGNOSIS — I10 Essential (primary) hypertension: Secondary | ICD-10-CM | POA: Diagnosis not present

## 2022-10-19 DIAGNOSIS — M6259 Muscle wasting and atrophy, not elsewhere classified, multiple sites: Secondary | ICD-10-CM | POA: Diagnosis not present

## 2022-10-19 MED ORDER — AZELASTINE HCL 0.1 % NA SOLN: 1.0000 | Freq: Two times a day (BID) | NASAL | 3 refills | Status: AC

## 2022-10-19 MED ORDER — BENAZEPRIL HCL 40 MG PO TABS: ORAL_TABLET | ORAL | 3 refills | Status: DC

## 2022-10-19 MED ORDER — SPIRONOLACTONE 25 MG PO TABS: 25.0000 mg | ORAL_TABLET | Freq: Every day | ORAL | 3 refills | Status: DC

## 2022-10-19 NOTE — Progress Notes (Signed)
   SUBJECTIVE:   CHIEF COMPLAINT / HPI:   Hypertension: - Medications: benazepril, spironolactone - Compliance: good - Checking BP at home: not currently.  - Denies any SOB, CP, vision changes, LE edema, medication SEs, or symptoms of hypotension  HLD - on pravastatin. Tolerating.   RLS - on pramipexole, doing well.   Allergies - needs refill on astelin spray.  OBJECTIVE:   BP (!) 186/82   Pulse 77   Ht 5\' 5"  (1.651 m)   Wt 159 lb 6.4 oz (72.3 kg)   SpO2 100%   BMI 26.53 kg/m   Gen: well appearing, in NAD Card: RRR Lungs: CTAB Ext: WWP, no edema  ASSESSMENT/PLAN:   Hypertension Elevated initially and on recheck but improved. Wide pulse pressure and asymptomatic, no changes for now. Previously intolerable to thiazides, beta blocker and amlodipine.  Restless leg syndrome Doing well on current regimen, no changes made today.  ABLA (acute blood loss anemia) From recent hip surgery. Repeat labs.   Osteoporosis Previously on fosamax. With recent pathologic fracture. Needs repeat DEXA, obtain on f/u and consider restarting bisphosphonate pending results.     Caro Laroche, DO

## 2022-10-19 NOTE — Patient Instructions (Signed)
It was great to see you!  Our plans for today:  - No changes to your medications today.  - Get your shingles vaccine at the pharmacy.  We are checking some labs today, we will release these results to your MyChart.  Take care and seek immediate care sooner if you develop any concerns.   Dr. Linwood Dibbles

## 2022-10-20 ENCOUNTER — Encounter: Payer: Self-pay | Admitting: Family Medicine

## 2022-10-20 DIAGNOSIS — R296 Repeated falls: Secondary | ICD-10-CM | POA: Diagnosis not present

## 2022-10-20 DIAGNOSIS — M6259 Muscle wasting and atrophy, not elsewhere classified, multiple sites: Secondary | ICD-10-CM | POA: Diagnosis not present

## 2022-10-20 DIAGNOSIS — R278 Other lack of coordination: Secondary | ICD-10-CM | POA: Diagnosis not present

## 2022-10-20 DIAGNOSIS — R2689 Other abnormalities of gait and mobility: Secondary | ICD-10-CM | POA: Diagnosis not present

## 2022-10-20 LAB — CBC
Hematocrit: 36.8 % (ref 34.0–46.6)
Hemoglobin: 11.7 g/dL (ref 11.1–15.9)
MCH: 28.3 pg (ref 26.6–33.0)
MCHC: 31.8 g/dL (ref 31.5–35.7)
MCV: 89 fL (ref 79–97)
Platelets: 219 10*3/uL (ref 150–450)
RBC: 4.14 x10E6/uL (ref 3.77–5.28)
RDW: 13.8 % (ref 11.7–15.4)
WBC: 7.1 10*3/uL (ref 3.4–10.8)

## 2022-10-20 LAB — BASIC METABOLIC PANEL
BUN/Creatinine Ratio: 23 (ref 12–28)
BUN: 19 mg/dL (ref 8–27)
CO2: 22 mmol/L (ref 20–29)
Calcium: 9.4 mg/dL (ref 8.7–10.3)
Chloride: 101 mmol/L (ref 96–106)
Creatinine, Ser: 0.81 mg/dL (ref 0.57–1.00)
Glucose: 83 mg/dL (ref 70–99)
Potassium: 5.2 mmol/L (ref 3.5–5.2)
Sodium: 138 mmol/L (ref 134–144)
eGFR: 70 mL/min/{1.73_m2} (ref >59)

## 2022-10-20 NOTE — Assessment & Plan Note (Signed)
Previously on fosamax. With recent pathologic fracture. Needs repeat DEXA, obtain on f/u and consider restarting bisphosphonate pending results.

## 2022-10-20 NOTE — Assessment & Plan Note (Signed)
From recent hip surgery. Repeat labs.

## 2022-10-20 NOTE — Assessment & Plan Note (Signed)
Doing well on current regimen, no changes made today. 

## 2022-10-20 NOTE — Assessment & Plan Note (Signed)
Elevated initially and on recheck but improved. Wide pulse pressure and asymptomatic, no changes for now. Previously intolerable to thiazides, beta blocker and amlodipine.

## 2022-10-21 DIAGNOSIS — N3941 Urge incontinence: Secondary | ICD-10-CM | POA: Diagnosis not present

## 2022-10-21 DIAGNOSIS — R3915 Urgency of urination: Secondary | ICD-10-CM | POA: Diagnosis not present

## 2022-10-25 DIAGNOSIS — R2689 Other abnormalities of gait and mobility: Secondary | ICD-10-CM | POA: Diagnosis not present

## 2022-10-25 DIAGNOSIS — R278 Other lack of coordination: Secondary | ICD-10-CM | POA: Diagnosis not present

## 2022-10-25 DIAGNOSIS — M6259 Muscle wasting and atrophy, not elsewhere classified, multiple sites: Secondary | ICD-10-CM | POA: Diagnosis not present

## 2022-10-25 DIAGNOSIS — R296 Repeated falls: Secondary | ICD-10-CM | POA: Diagnosis not present

## 2022-10-28 DIAGNOSIS — R2689 Other abnormalities of gait and mobility: Secondary | ICD-10-CM | POA: Diagnosis not present

## 2022-10-28 DIAGNOSIS — R296 Repeated falls: Secondary | ICD-10-CM | POA: Diagnosis not present

## 2022-10-28 DIAGNOSIS — M6259 Muscle wasting and atrophy, not elsewhere classified, multiple sites: Secondary | ICD-10-CM | POA: Diagnosis not present

## 2022-10-28 DIAGNOSIS — R278 Other lack of coordination: Secondary | ICD-10-CM | POA: Diagnosis not present

## 2022-11-03 DIAGNOSIS — R2689 Other abnormalities of gait and mobility: Secondary | ICD-10-CM | POA: Diagnosis not present

## 2022-11-03 DIAGNOSIS — R296 Repeated falls: Secondary | ICD-10-CM | POA: Diagnosis not present

## 2022-11-03 DIAGNOSIS — R278 Other lack of coordination: Secondary | ICD-10-CM | POA: Diagnosis not present

## 2022-11-03 DIAGNOSIS — M6259 Muscle wasting and atrophy, not elsewhere classified, multiple sites: Secondary | ICD-10-CM | POA: Diagnosis not present

## 2022-11-04 DIAGNOSIS — R296 Repeated falls: Secondary | ICD-10-CM | POA: Diagnosis not present

## 2022-11-04 DIAGNOSIS — M6259 Muscle wasting and atrophy, not elsewhere classified, multiple sites: Secondary | ICD-10-CM | POA: Diagnosis not present

## 2022-11-04 DIAGNOSIS — R2689 Other abnormalities of gait and mobility: Secondary | ICD-10-CM | POA: Diagnosis not present

## 2022-11-04 DIAGNOSIS — R278 Other lack of coordination: Secondary | ICD-10-CM | POA: Diagnosis not present

## 2022-11-08 ENCOUNTER — Other Ambulatory Visit: Payer: Self-pay | Admitting: Family Medicine

## 2022-11-08 DIAGNOSIS — R2689 Other abnormalities of gait and mobility: Secondary | ICD-10-CM | POA: Diagnosis not present

## 2022-11-08 DIAGNOSIS — M6259 Muscle wasting and atrophy, not elsewhere classified, multiple sites: Secondary | ICD-10-CM | POA: Diagnosis not present

## 2022-11-08 DIAGNOSIS — I10 Essential (primary) hypertension: Secondary | ICD-10-CM

## 2022-11-08 DIAGNOSIS — R296 Repeated falls: Secondary | ICD-10-CM | POA: Diagnosis not present

## 2022-11-08 DIAGNOSIS — R278 Other lack of coordination: Secondary | ICD-10-CM | POA: Diagnosis not present

## 2022-11-09 DIAGNOSIS — R2689 Other abnormalities of gait and mobility: Secondary | ICD-10-CM | POA: Diagnosis not present

## 2022-11-09 DIAGNOSIS — M6259 Muscle wasting and atrophy, not elsewhere classified, multiple sites: Secondary | ICD-10-CM | POA: Diagnosis not present

## 2022-11-09 DIAGNOSIS — R296 Repeated falls: Secondary | ICD-10-CM | POA: Diagnosis not present

## 2022-11-09 DIAGNOSIS — R278 Other lack of coordination: Secondary | ICD-10-CM | POA: Diagnosis not present

## 2022-11-11 DIAGNOSIS — M6259 Muscle wasting and atrophy, not elsewhere classified, multiple sites: Secondary | ICD-10-CM | POA: Diagnosis not present

## 2022-11-11 DIAGNOSIS — Z7982 Long term (current) use of aspirin: Secondary | ICD-10-CM | POA: Diagnosis not present

## 2022-11-11 DIAGNOSIS — G629 Polyneuropathy, unspecified: Secondary | ICD-10-CM | POA: Diagnosis not present

## 2022-11-11 DIAGNOSIS — S7292XA Unspecified fracture of left femur, initial encounter for closed fracture: Secondary | ICD-10-CM | POA: Diagnosis not present

## 2022-11-11 DIAGNOSIS — R2689 Other abnormalities of gait and mobility: Secondary | ICD-10-CM | POA: Diagnosis not present

## 2022-11-11 DIAGNOSIS — R296 Repeated falls: Secondary | ICD-10-CM | POA: Diagnosis not present

## 2022-11-11 DIAGNOSIS — R278 Other lack of coordination: Secondary | ICD-10-CM | POA: Diagnosis not present

## 2022-11-11 DIAGNOSIS — G2581 Restless legs syndrome: Secondary | ICD-10-CM | POA: Diagnosis not present

## 2022-11-11 DIAGNOSIS — K5909 Other constipation: Secondary | ICD-10-CM | POA: Diagnosis not present

## 2022-11-16 DIAGNOSIS — M6259 Muscle wasting and atrophy, not elsewhere classified, multiple sites: Secondary | ICD-10-CM | POA: Diagnosis not present

## 2022-11-16 DIAGNOSIS — R296 Repeated falls: Secondary | ICD-10-CM | POA: Diagnosis not present

## 2022-11-16 DIAGNOSIS — R278 Other lack of coordination: Secondary | ICD-10-CM | POA: Diagnosis not present

## 2022-11-16 DIAGNOSIS — R2689 Other abnormalities of gait and mobility: Secondary | ICD-10-CM | POA: Diagnosis not present

## 2022-11-17 DIAGNOSIS — R278 Other lack of coordination: Secondary | ICD-10-CM | POA: Diagnosis not present

## 2022-11-17 DIAGNOSIS — M6259 Muscle wasting and atrophy, not elsewhere classified, multiple sites: Secondary | ICD-10-CM | POA: Diagnosis not present

## 2022-11-17 DIAGNOSIS — R2689 Other abnormalities of gait and mobility: Secondary | ICD-10-CM | POA: Diagnosis not present

## 2022-11-17 DIAGNOSIS — R296 Repeated falls: Secondary | ICD-10-CM | POA: Diagnosis not present

## 2022-11-22 DIAGNOSIS — R296 Repeated falls: Secondary | ICD-10-CM | POA: Diagnosis not present

## 2022-11-22 DIAGNOSIS — R2689 Other abnormalities of gait and mobility: Secondary | ICD-10-CM | POA: Diagnosis not present

## 2022-11-22 DIAGNOSIS — R278 Other lack of coordination: Secondary | ICD-10-CM | POA: Diagnosis not present

## 2022-11-22 DIAGNOSIS — M6259 Muscle wasting and atrophy, not elsewhere classified, multiple sites: Secondary | ICD-10-CM | POA: Diagnosis not present

## 2022-11-24 DIAGNOSIS — I10 Essential (primary) hypertension: Secondary | ICD-10-CM | POA: Diagnosis not present

## 2022-11-24 DIAGNOSIS — Z9181 History of falling: Secondary | ICD-10-CM | POA: Diagnosis not present

## 2022-11-24 DIAGNOSIS — G2581 Restless legs syndrome: Secondary | ICD-10-CM | POA: Diagnosis not present

## 2022-11-24 DIAGNOSIS — M5459 Other low back pain: Secondary | ICD-10-CM | POA: Diagnosis not present

## 2022-11-24 DIAGNOSIS — Z87440 Personal history of urinary (tract) infections: Secondary | ICD-10-CM | POA: Diagnosis not present

## 2022-11-24 DIAGNOSIS — E782 Mixed hyperlipidemia: Secondary | ICD-10-CM | POA: Diagnosis not present

## 2022-11-25 DIAGNOSIS — R278 Other lack of coordination: Secondary | ICD-10-CM | POA: Diagnosis not present

## 2022-11-25 DIAGNOSIS — R296 Repeated falls: Secondary | ICD-10-CM | POA: Diagnosis not present

## 2022-11-25 DIAGNOSIS — M6259 Muscle wasting and atrophy, not elsewhere classified, multiple sites: Secondary | ICD-10-CM | POA: Diagnosis not present

## 2022-11-25 DIAGNOSIS — R2689 Other abnormalities of gait and mobility: Secondary | ICD-10-CM | POA: Diagnosis not present

## 2022-11-30 DIAGNOSIS — R2689 Other abnormalities of gait and mobility: Secondary | ICD-10-CM | POA: Diagnosis not present

## 2022-11-30 DIAGNOSIS — R296 Repeated falls: Secondary | ICD-10-CM | POA: Diagnosis not present

## 2022-11-30 DIAGNOSIS — R278 Other lack of coordination: Secondary | ICD-10-CM | POA: Diagnosis not present

## 2022-11-30 DIAGNOSIS — M6259 Muscle wasting and atrophy, not elsewhere classified, multiple sites: Secondary | ICD-10-CM | POA: Diagnosis not present

## 2022-12-02 DIAGNOSIS — H40023 Open angle with borderline findings, high risk, bilateral: Secondary | ICD-10-CM | POA: Diagnosis not present

## 2022-12-02 DIAGNOSIS — H26493 Other secondary cataract, bilateral: Secondary | ICD-10-CM | POA: Diagnosis not present

## 2022-12-02 DIAGNOSIS — Z961 Presence of intraocular lens: Secondary | ICD-10-CM | POA: Diagnosis not present

## 2022-12-02 DIAGNOSIS — H02834 Dermatochalasis of left upper eyelid: Secondary | ICD-10-CM | POA: Diagnosis not present

## 2022-12-02 DIAGNOSIS — H02831 Dermatochalasis of right upper eyelid: Secondary | ICD-10-CM | POA: Diagnosis not present

## 2022-12-02 DIAGNOSIS — D3131 Benign neoplasm of right choroid: Secondary | ICD-10-CM | POA: Diagnosis not present

## 2022-12-02 DIAGNOSIS — H43813 Vitreous degeneration, bilateral: Secondary | ICD-10-CM | POA: Diagnosis not present

## 2022-12-03 DIAGNOSIS — R296 Repeated falls: Secondary | ICD-10-CM | POA: Diagnosis not present

## 2022-12-03 DIAGNOSIS — M6259 Muscle wasting and atrophy, not elsewhere classified, multiple sites: Secondary | ICD-10-CM | POA: Diagnosis not present

## 2022-12-03 DIAGNOSIS — R2689 Other abnormalities of gait and mobility: Secondary | ICD-10-CM | POA: Diagnosis not present

## 2022-12-03 DIAGNOSIS — R278 Other lack of coordination: Secondary | ICD-10-CM | POA: Diagnosis not present

## 2022-12-06 DIAGNOSIS — M2012 Hallux valgus (acquired), left foot: Secondary | ICD-10-CM | POA: Diagnosis not present

## 2022-12-06 DIAGNOSIS — R234 Changes in skin texture: Secondary | ICD-10-CM | POA: Diagnosis not present

## 2022-12-06 DIAGNOSIS — L600 Ingrowing nail: Secondary | ICD-10-CM | POA: Diagnosis not present

## 2022-12-06 DIAGNOSIS — M2042 Other hammer toe(s) (acquired), left foot: Secondary | ICD-10-CM | POA: Diagnosis not present

## 2022-12-06 DIAGNOSIS — R278 Other lack of coordination: Secondary | ICD-10-CM | POA: Diagnosis not present

## 2022-12-06 DIAGNOSIS — M2041 Other hammer toe(s) (acquired), right foot: Secondary | ICD-10-CM | POA: Diagnosis not present

## 2022-12-06 DIAGNOSIS — B351 Tinea unguium: Secondary | ICD-10-CM | POA: Diagnosis not present

## 2022-12-06 DIAGNOSIS — I739 Peripheral vascular disease, unspecified: Secondary | ICD-10-CM | POA: Diagnosis not present

## 2022-12-06 DIAGNOSIS — R600 Localized edema: Secondary | ICD-10-CM | POA: Diagnosis not present

## 2022-12-06 DIAGNOSIS — I8393 Asymptomatic varicose veins of bilateral lower extremities: Secondary | ICD-10-CM | POA: Diagnosis not present

## 2022-12-06 DIAGNOSIS — M79671 Pain in right foot: Secondary | ICD-10-CM | POA: Diagnosis not present

## 2022-12-06 DIAGNOSIS — M79672 Pain in left foot: Secondary | ICD-10-CM | POA: Diagnosis not present

## 2022-12-06 DIAGNOSIS — R2689 Other abnormalities of gait and mobility: Secondary | ICD-10-CM | POA: Diagnosis not present

## 2022-12-06 DIAGNOSIS — R296 Repeated falls: Secondary | ICD-10-CM | POA: Diagnosis not present

## 2022-12-06 DIAGNOSIS — M2011 Hallux valgus (acquired), right foot: Secondary | ICD-10-CM | POA: Diagnosis not present

## 2022-12-06 DIAGNOSIS — M6259 Muscle wasting and atrophy, not elsewhere classified, multiple sites: Secondary | ICD-10-CM | POA: Diagnosis not present

## 2022-12-08 DIAGNOSIS — M6259 Muscle wasting and atrophy, not elsewhere classified, multiple sites: Secondary | ICD-10-CM | POA: Diagnosis not present

## 2022-12-08 DIAGNOSIS — R278 Other lack of coordination: Secondary | ICD-10-CM | POA: Diagnosis not present

## 2022-12-08 DIAGNOSIS — R2689 Other abnormalities of gait and mobility: Secondary | ICD-10-CM | POA: Diagnosis not present

## 2022-12-08 DIAGNOSIS — R296 Repeated falls: Secondary | ICD-10-CM | POA: Diagnosis not present

## 2022-12-09 DIAGNOSIS — G629 Polyneuropathy, unspecified: Secondary | ICD-10-CM | POA: Diagnosis not present

## 2022-12-09 DIAGNOSIS — Z23 Encounter for immunization: Secondary | ICD-10-CM | POA: Diagnosis not present

## 2022-12-09 DIAGNOSIS — S7292XA Unspecified fracture of left femur, initial encounter for closed fracture: Secondary | ICD-10-CM | POA: Diagnosis not present

## 2022-12-14 DIAGNOSIS — R296 Repeated falls: Secondary | ICD-10-CM | POA: Diagnosis not present

## 2022-12-14 DIAGNOSIS — M6259 Muscle wasting and atrophy, not elsewhere classified, multiple sites: Secondary | ICD-10-CM | POA: Diagnosis not present

## 2022-12-14 DIAGNOSIS — R2689 Other abnormalities of gait and mobility: Secondary | ICD-10-CM | POA: Diagnosis not present

## 2022-12-14 DIAGNOSIS — R278 Other lack of coordination: Secondary | ICD-10-CM | POA: Diagnosis not present

## 2022-12-15 DIAGNOSIS — R278 Other lack of coordination: Secondary | ICD-10-CM | POA: Diagnosis not present

## 2022-12-15 DIAGNOSIS — R2689 Other abnormalities of gait and mobility: Secondary | ICD-10-CM | POA: Diagnosis not present

## 2022-12-15 DIAGNOSIS — R296 Repeated falls: Secondary | ICD-10-CM | POA: Diagnosis not present

## 2022-12-15 DIAGNOSIS — M6259 Muscle wasting and atrophy, not elsewhere classified, multiple sites: Secondary | ICD-10-CM | POA: Diagnosis not present

## 2022-12-21 DIAGNOSIS — H40023 Open angle with borderline findings, high risk, bilateral: Secondary | ICD-10-CM | POA: Diagnosis not present

## 2022-12-21 DIAGNOSIS — M6259 Muscle wasting and atrophy, not elsewhere classified, multiple sites: Secondary | ICD-10-CM | POA: Diagnosis not present

## 2022-12-21 DIAGNOSIS — G2581 Restless legs syndrome: Secondary | ICD-10-CM | POA: Diagnosis not present

## 2022-12-21 DIAGNOSIS — R2689 Other abnormalities of gait and mobility: Secondary | ICD-10-CM | POA: Diagnosis not present

## 2022-12-21 DIAGNOSIS — G629 Polyneuropathy, unspecified: Secondary | ICD-10-CM | POA: Diagnosis not present

## 2022-12-21 DIAGNOSIS — I10 Essential (primary) hypertension: Secondary | ICD-10-CM | POA: Diagnosis not present

## 2022-12-21 DIAGNOSIS — R296 Repeated falls: Secondary | ICD-10-CM | POA: Diagnosis not present

## 2022-12-21 DIAGNOSIS — Z961 Presence of intraocular lens: Secondary | ICD-10-CM | POA: Diagnosis not present

## 2022-12-21 DIAGNOSIS — R278 Other lack of coordination: Secondary | ICD-10-CM | POA: Diagnosis not present

## 2022-12-24 DIAGNOSIS — M6259 Muscle wasting and atrophy, not elsewhere classified, multiple sites: Secondary | ICD-10-CM | POA: Diagnosis not present

## 2022-12-24 DIAGNOSIS — R296 Repeated falls: Secondary | ICD-10-CM | POA: Diagnosis not present

## 2022-12-24 DIAGNOSIS — R278 Other lack of coordination: Secondary | ICD-10-CM | POA: Diagnosis not present

## 2022-12-24 DIAGNOSIS — R2689 Other abnormalities of gait and mobility: Secondary | ICD-10-CM | POA: Diagnosis not present

## 2022-12-27 DIAGNOSIS — R278 Other lack of coordination: Secondary | ICD-10-CM | POA: Diagnosis not present

## 2022-12-27 DIAGNOSIS — R2689 Other abnormalities of gait and mobility: Secondary | ICD-10-CM | POA: Diagnosis not present

## 2022-12-27 DIAGNOSIS — R296 Repeated falls: Secondary | ICD-10-CM | POA: Diagnosis not present

## 2022-12-27 DIAGNOSIS — M6259 Muscle wasting and atrophy, not elsewhere classified, multiple sites: Secondary | ICD-10-CM | POA: Diagnosis not present

## 2022-12-29 DIAGNOSIS — R296 Repeated falls: Secondary | ICD-10-CM | POA: Diagnosis not present

## 2022-12-29 DIAGNOSIS — R278 Other lack of coordination: Secondary | ICD-10-CM | POA: Diagnosis not present

## 2022-12-29 DIAGNOSIS — M6259 Muscle wasting and atrophy, not elsewhere classified, multiple sites: Secondary | ICD-10-CM | POA: Diagnosis not present

## 2022-12-29 DIAGNOSIS — R2689 Other abnormalities of gait and mobility: Secondary | ICD-10-CM | POA: Diagnosis not present

## 2023-01-03 DIAGNOSIS — R296 Repeated falls: Secondary | ICD-10-CM | POA: Diagnosis not present

## 2023-01-03 DIAGNOSIS — R278 Other lack of coordination: Secondary | ICD-10-CM | POA: Diagnosis not present

## 2023-01-03 DIAGNOSIS — M6259 Muscle wasting and atrophy, not elsewhere classified, multiple sites: Secondary | ICD-10-CM | POA: Diagnosis not present

## 2023-01-03 DIAGNOSIS — R2689 Other abnormalities of gait and mobility: Secondary | ICD-10-CM | POA: Diagnosis not present

## 2023-01-05 DIAGNOSIS — R296 Repeated falls: Secondary | ICD-10-CM | POA: Diagnosis not present

## 2023-01-05 DIAGNOSIS — M6259 Muscle wasting and atrophy, not elsewhere classified, multiple sites: Secondary | ICD-10-CM | POA: Diagnosis not present

## 2023-01-05 DIAGNOSIS — R2689 Other abnormalities of gait and mobility: Secondary | ICD-10-CM | POA: Diagnosis not present

## 2023-01-05 DIAGNOSIS — R278 Other lack of coordination: Secondary | ICD-10-CM | POA: Diagnosis not present

## 2023-01-11 DIAGNOSIS — R2689 Other abnormalities of gait and mobility: Secondary | ICD-10-CM | POA: Diagnosis not present

## 2023-01-11 DIAGNOSIS — R278 Other lack of coordination: Secondary | ICD-10-CM | POA: Diagnosis not present

## 2023-01-11 DIAGNOSIS — M6259 Muscle wasting and atrophy, not elsewhere classified, multiple sites: Secondary | ICD-10-CM | POA: Diagnosis not present

## 2023-01-11 DIAGNOSIS — R296 Repeated falls: Secondary | ICD-10-CM | POA: Diagnosis not present

## 2023-01-12 DIAGNOSIS — S7292XA Unspecified fracture of left femur, initial encounter for closed fracture: Secondary | ICD-10-CM | POA: Diagnosis not present

## 2023-01-12 DIAGNOSIS — G629 Polyneuropathy, unspecified: Secondary | ICD-10-CM | POA: Diagnosis not present

## 2023-01-14 DIAGNOSIS — M6259 Muscle wasting and atrophy, not elsewhere classified, multiple sites: Secondary | ICD-10-CM | POA: Diagnosis not present

## 2023-01-14 DIAGNOSIS — R296 Repeated falls: Secondary | ICD-10-CM | POA: Diagnosis not present

## 2023-01-14 DIAGNOSIS — R278 Other lack of coordination: Secondary | ICD-10-CM | POA: Diagnosis not present

## 2023-01-14 DIAGNOSIS — R2689 Other abnormalities of gait and mobility: Secondary | ICD-10-CM | POA: Diagnosis not present

## 2023-01-18 DIAGNOSIS — M6259 Muscle wasting and atrophy, not elsewhere classified, multiple sites: Secondary | ICD-10-CM | POA: Diagnosis not present

## 2023-01-18 DIAGNOSIS — R278 Other lack of coordination: Secondary | ICD-10-CM | POA: Diagnosis not present

## 2023-01-18 DIAGNOSIS — K5901 Slow transit constipation: Secondary | ICD-10-CM | POA: Diagnosis not present

## 2023-01-18 DIAGNOSIS — R296 Repeated falls: Secondary | ICD-10-CM | POA: Diagnosis not present

## 2023-01-18 DIAGNOSIS — R2689 Other abnormalities of gait and mobility: Secondary | ICD-10-CM | POA: Diagnosis not present

## 2023-01-18 DIAGNOSIS — I10 Essential (primary) hypertension: Secondary | ICD-10-CM | POA: Diagnosis not present

## 2023-01-18 DIAGNOSIS — N3281 Overactive bladder: Secondary | ICD-10-CM | POA: Diagnosis not present

## 2023-01-20 DIAGNOSIS — Z87440 Personal history of urinary (tract) infections: Secondary | ICD-10-CM | POA: Diagnosis not present

## 2023-01-20 DIAGNOSIS — N390 Urinary tract infection, site not specified: Secondary | ICD-10-CM | POA: Diagnosis not present

## 2023-01-20 DIAGNOSIS — N3281 Overactive bladder: Secondary | ICD-10-CM | POA: Diagnosis not present

## 2023-01-20 DIAGNOSIS — R278 Other lack of coordination: Secondary | ICD-10-CM | POA: Diagnosis not present

## 2023-01-20 DIAGNOSIS — M6259 Muscle wasting and atrophy, not elsewhere classified, multiple sites: Secondary | ICD-10-CM | POA: Diagnosis not present

## 2023-01-20 DIAGNOSIS — K5901 Slow transit constipation: Secondary | ICD-10-CM | POA: Diagnosis not present

## 2023-01-20 DIAGNOSIS — R2689 Other abnormalities of gait and mobility: Secondary | ICD-10-CM | POA: Diagnosis not present

## 2023-01-20 DIAGNOSIS — R296 Repeated falls: Secondary | ICD-10-CM | POA: Diagnosis not present

## 2023-01-25 DIAGNOSIS — R2689 Other abnormalities of gait and mobility: Secondary | ICD-10-CM | POA: Diagnosis not present

## 2023-01-25 DIAGNOSIS — R278 Other lack of coordination: Secondary | ICD-10-CM | POA: Diagnosis not present

## 2023-01-25 DIAGNOSIS — M6259 Muscle wasting and atrophy, not elsewhere classified, multiple sites: Secondary | ICD-10-CM | POA: Diagnosis not present

## 2023-01-25 DIAGNOSIS — R296 Repeated falls: Secondary | ICD-10-CM | POA: Diagnosis not present

## 2023-01-27 DIAGNOSIS — R296 Repeated falls: Secondary | ICD-10-CM | POA: Diagnosis not present

## 2023-01-27 DIAGNOSIS — R278 Other lack of coordination: Secondary | ICD-10-CM | POA: Diagnosis not present

## 2023-01-27 DIAGNOSIS — R2689 Other abnormalities of gait and mobility: Secondary | ICD-10-CM | POA: Diagnosis not present

## 2023-01-27 DIAGNOSIS — M6259 Muscle wasting and atrophy, not elsewhere classified, multiple sites: Secondary | ICD-10-CM | POA: Diagnosis not present

## 2023-02-01 DIAGNOSIS — Z131 Encounter for screening for diabetes mellitus: Secondary | ICD-10-CM | POA: Diagnosis not present

## 2023-02-01 DIAGNOSIS — M6259 Muscle wasting and atrophy, not elsewhere classified, multiple sites: Secondary | ICD-10-CM | POA: Diagnosis not present

## 2023-02-01 DIAGNOSIS — E785 Hyperlipidemia, unspecified: Secondary | ICD-10-CM | POA: Diagnosis not present

## 2023-02-01 DIAGNOSIS — K5901 Slow transit constipation: Secondary | ICD-10-CM | POA: Diagnosis not present

## 2023-02-01 DIAGNOSIS — R2689 Other abnormalities of gait and mobility: Secondary | ICD-10-CM | POA: Diagnosis not present

## 2023-02-01 DIAGNOSIS — M5459 Other low back pain: Secondary | ICD-10-CM | POA: Diagnosis not present

## 2023-02-01 DIAGNOSIS — G629 Polyneuropathy, unspecified: Secondary | ICD-10-CM | POA: Diagnosis not present

## 2023-02-01 DIAGNOSIS — R278 Other lack of coordination: Secondary | ICD-10-CM | POA: Diagnosis not present

## 2023-02-01 DIAGNOSIS — I10 Essential (primary) hypertension: Secondary | ICD-10-CM | POA: Diagnosis not present

## 2023-02-01 DIAGNOSIS — R296 Repeated falls: Secondary | ICD-10-CM | POA: Diagnosis not present

## 2023-02-03 DIAGNOSIS — R296 Repeated falls: Secondary | ICD-10-CM | POA: Diagnosis not present

## 2023-02-03 DIAGNOSIS — M6259 Muscle wasting and atrophy, not elsewhere classified, multiple sites: Secondary | ICD-10-CM | POA: Diagnosis not present

## 2023-02-03 DIAGNOSIS — R2689 Other abnormalities of gait and mobility: Secondary | ICD-10-CM | POA: Diagnosis not present

## 2023-02-03 DIAGNOSIS — R278 Other lack of coordination: Secondary | ICD-10-CM | POA: Diagnosis not present

## 2023-02-07 DIAGNOSIS — M6259 Muscle wasting and atrophy, not elsewhere classified, multiple sites: Secondary | ICD-10-CM | POA: Diagnosis not present

## 2023-02-07 DIAGNOSIS — R278 Other lack of coordination: Secondary | ICD-10-CM | POA: Diagnosis not present

## 2023-02-07 DIAGNOSIS — R2689 Other abnormalities of gait and mobility: Secondary | ICD-10-CM | POA: Diagnosis not present

## 2023-02-07 DIAGNOSIS — R296 Repeated falls: Secondary | ICD-10-CM | POA: Diagnosis not present

## 2023-02-15 DIAGNOSIS — N1831 Chronic kidney disease, stage 3a: Secondary | ICD-10-CM | POA: Diagnosis not present

## 2023-02-15 DIAGNOSIS — S7292XA Unspecified fracture of left femur, initial encounter for closed fracture: Secondary | ICD-10-CM | POA: Diagnosis not present

## 2023-02-15 DIAGNOSIS — G629 Polyneuropathy, unspecified: Secondary | ICD-10-CM | POA: Diagnosis not present

## 2023-02-15 DIAGNOSIS — H6122 Impacted cerumen, left ear: Secondary | ICD-10-CM | POA: Diagnosis not present

## 2023-02-15 DIAGNOSIS — K5901 Slow transit constipation: Secondary | ICD-10-CM | POA: Diagnosis not present

## 2023-02-15 DIAGNOSIS — I129 Hypertensive chronic kidney disease with stage 1 through stage 4 chronic kidney disease, or unspecified chronic kidney disease: Secondary | ICD-10-CM | POA: Diagnosis not present

## 2023-03-31 ENCOUNTER — Other Ambulatory Visit: Payer: Self-pay

## 2023-03-31 ENCOUNTER — Emergency Department (HOSPITAL_COMMUNITY): Payer: Medicare Other

## 2023-03-31 ENCOUNTER — Inpatient Hospital Stay (HOSPITAL_COMMUNITY)
Admission: EM | Admit: 2023-03-31 | Discharge: 2023-04-08 | DRG: 291 | Disposition: A | Discharge status: Skilled Nursing Facility | Payer: Medicare Other | Attending: Family Medicine | Admitting: Family Medicine

## 2023-03-31 ENCOUNTER — Encounter (HOSPITAL_COMMUNITY): Payer: Self-pay

## 2023-03-31 DIAGNOSIS — J90 Pleural effusion, not elsewhere classified: Secondary | ICD-10-CM

## 2023-03-31 DIAGNOSIS — Z66 Do not resuscitate: Secondary | ICD-10-CM | POA: Diagnosis present

## 2023-03-31 DIAGNOSIS — Z91013 Allergy to seafood: Secondary | ICD-10-CM

## 2023-03-31 DIAGNOSIS — K761 Chronic passive congestion of liver: Secondary | ICD-10-CM | POA: Diagnosis present

## 2023-03-31 DIAGNOSIS — E875 Hyperkalemia: Secondary | ICD-10-CM | POA: Diagnosis present

## 2023-03-31 DIAGNOSIS — Z90710 Acquired absence of both cervix and uterus: Secondary | ICD-10-CM

## 2023-03-31 DIAGNOSIS — Z880 Allergy status to penicillin: Secondary | ICD-10-CM

## 2023-03-31 DIAGNOSIS — I4891 Unspecified atrial fibrillation: Secondary | ICD-10-CM | POA: Diagnosis present

## 2023-03-31 DIAGNOSIS — I5082 Biventricular heart failure: Secondary | ICD-10-CM | POA: Diagnosis present

## 2023-03-31 DIAGNOSIS — N179 Acute kidney failure, unspecified: Secondary | ICD-10-CM | POA: Insufficient documentation

## 2023-03-31 DIAGNOSIS — Z7901 Long term (current) use of anticoagulants: Secondary | ICD-10-CM

## 2023-03-31 DIAGNOSIS — J189 Pneumonia, unspecified organism: Principal | ICD-10-CM

## 2023-03-31 DIAGNOSIS — Z91018 Allergy to other foods: Secondary | ICD-10-CM

## 2023-03-31 DIAGNOSIS — Z8249 Family history of ischemic heart disease and other diseases of the circulatory system: Secondary | ICD-10-CM

## 2023-03-31 DIAGNOSIS — I2489 Other forms of acute ischemic heart disease: Secondary | ICD-10-CM | POA: Diagnosis present

## 2023-03-31 DIAGNOSIS — Z833 Family history of diabetes mellitus: Secondary | ICD-10-CM

## 2023-03-31 DIAGNOSIS — E871 Hypo-osmolality and hyponatremia: Secondary | ICD-10-CM | POA: Diagnosis present

## 2023-03-31 DIAGNOSIS — I509 Heart failure, unspecified: Secondary | ICD-10-CM

## 2023-03-31 DIAGNOSIS — I5043 Acute on chronic combined systolic (congestive) and diastolic (congestive) heart failure: Secondary | ICD-10-CM

## 2023-03-31 DIAGNOSIS — J159 Unspecified bacterial pneumonia: Secondary | ICD-10-CM | POA: Diagnosis present

## 2023-03-31 DIAGNOSIS — I110 Hypertensive heart disease with heart failure: Secondary | ICD-10-CM | POA: Diagnosis not present

## 2023-03-31 DIAGNOSIS — Z841 Family history of disorders of kidney and ureter: Secondary | ICD-10-CM

## 2023-03-31 DIAGNOSIS — Z9181 History of falling: Secondary | ICD-10-CM

## 2023-03-31 DIAGNOSIS — I080 Rheumatic disorders of both mitral and aortic valves: Secondary | ICD-10-CM | POA: Diagnosis present

## 2023-03-31 DIAGNOSIS — Z79899 Other long term (current) drug therapy: Secondary | ICD-10-CM

## 2023-03-31 DIAGNOSIS — I2510 Atherosclerotic heart disease of native coronary artery without angina pectoris: Secondary | ICD-10-CM

## 2023-03-31 DIAGNOSIS — F419 Anxiety disorder, unspecified: Secondary | ICD-10-CM | POA: Diagnosis present

## 2023-03-31 DIAGNOSIS — E7800 Pure hypercholesterolemia, unspecified: Secondary | ICD-10-CM | POA: Diagnosis present

## 2023-03-31 DIAGNOSIS — Z515 Encounter for palliative care: Secondary | ICD-10-CM

## 2023-03-31 DIAGNOSIS — M419 Scoliosis, unspecified: Secondary | ICD-10-CM | POA: Diagnosis present

## 2023-03-31 DIAGNOSIS — Z881 Allergy status to other antibiotic agents status: Secondary | ICD-10-CM

## 2023-03-31 DIAGNOSIS — I482 Chronic atrial fibrillation, unspecified: Secondary | ICD-10-CM | POA: Insufficient documentation

## 2023-03-31 DIAGNOSIS — Z886 Allergy status to analgesic agent status: Secondary | ICD-10-CM

## 2023-03-31 DIAGNOSIS — E872 Acidosis, unspecified: Secondary | ICD-10-CM | POA: Diagnosis present

## 2023-03-31 DIAGNOSIS — I700 Atherosclerosis of aorta: Secondary | ICD-10-CM | POA: Diagnosis present

## 2023-03-31 DIAGNOSIS — G250 Essential tremor: Secondary | ICD-10-CM | POA: Diagnosis present

## 2023-03-31 DIAGNOSIS — T502X5A Adverse effect of carbonic-anhydrase inhibitors, benzothiadiazides and other diuretics, initial encounter: Secondary | ICD-10-CM | POA: Diagnosis present

## 2023-03-31 DIAGNOSIS — G8929 Other chronic pain: Secondary | ICD-10-CM | POA: Diagnosis present

## 2023-03-31 DIAGNOSIS — G2581 Restless legs syndrome: Secondary | ICD-10-CM | POA: Diagnosis present

## 2023-03-31 DIAGNOSIS — Z1152 Encounter for screening for COVID-19: Secondary | ICD-10-CM

## 2023-03-31 DIAGNOSIS — Z7982 Long term (current) use of aspirin: Secondary | ICD-10-CM

## 2023-03-31 DIAGNOSIS — Z888 Allergy status to other drugs, medicaments and biological substances status: Secondary | ICD-10-CM

## 2023-03-31 LAB — RESP PANEL BY RT-PCR (RSV, FLU A&B, COVID)  RVPGX2
Influenza A by PCR: NEGATIVE
Influenza B by PCR: NEGATIVE
Resp Syncytial Virus by PCR: NEGATIVE
SARS Coronavirus 2 by RT PCR: NEGATIVE

## 2023-03-31 LAB — I-STAT CG4 LACTIC ACID, ED
Lactic Acid, Venous: 1.1 mmol/L (ref 0.5–1.9)
Lactic Acid, Venous: 0.7 mmol/L (ref 0.5–1.9)

## 2023-03-31 LAB — BRAIN NATRIURETIC PEPTIDE: B Natriuretic Peptide: 1142 pg/mL — ABNORMAL HIGH (ref 0.0–100.0)

## 2023-03-31 LAB — CBC WITH DIFFERENTIAL/PLATELET
Abs Immature Granulocytes: 0.08 10*3/uL — ABNORMAL HIGH (ref 0.00–0.07)
Basophils Absolute: 0 10*3/uL (ref 0.0–0.1)
Basophils Relative: 0 %
Eosinophils Absolute: 0.1 10*3/uL (ref 0.0–0.5)
Eosinophils Relative: 0 %
HCT: 38.1 % (ref 36.0–46.0)
Hemoglobin: 12.8 g/dL (ref 12.0–15.0)
Immature Granulocytes: 1 %
Lymphocytes Relative: 9 %
Lymphs Abs: 1 10*3/uL (ref 0.7–4.0)
MCH: 29.1 pg (ref 26.0–34.0)
MCHC: 33.6 g/dL (ref 30.0–36.0)
MCV: 86.6 fL (ref 80.0–100.0)
Monocytes Absolute: 0.9 10*3/uL (ref 0.1–1.0)
Monocytes Relative: 8 %
Neutro Abs: 9.2 10*3/uL — ABNORMAL HIGH (ref 1.7–7.7)
Neutrophils Relative %: 82 %
Platelets: 262 10*3/uL (ref 150–400)
RBC: 4.4 MIL/uL (ref 3.87–5.11)
RDW: 14.3 % (ref 11.5–15.5)
WBC: 11.2 10*3/uL — ABNORMAL HIGH (ref 4.0–10.5)
nRBC: 0 % (ref 0.0–0.2)

## 2023-03-31 LAB — COMPREHENSIVE METABOLIC PANEL
ALT: 152 U/L — ABNORMAL HIGH (ref 0–44)
AST: 57 U/L — ABNORMAL HIGH (ref 15–41)
Albumin: 3.3 g/dL — ABNORMAL LOW (ref 3.5–5.0)
Alkaline Phosphatase: 105 U/L (ref 38–126)
Anion gap: 13 (ref 5–15)
BUN: 26 mg/dL — ABNORMAL HIGH (ref 8–23)
CO2: 17 mmol/L — ABNORMAL LOW (ref 22–32)
Calcium: 8.7 mg/dL — ABNORMAL LOW (ref 8.9–10.3)
Chloride: 91 mmol/L — ABNORMAL LOW (ref 98–111)
Creatinine, Ser: 1.06 mg/dL — ABNORMAL HIGH (ref 0.44–1.00)
GFR, Estimated: 50 mL/min — ABNORMAL LOW (ref >60)
Glucose, Bld: 119 mg/dL — ABNORMAL HIGH (ref 70–99)
Potassium: 5.5 mmol/L — ABNORMAL HIGH (ref 3.5–5.1)
Sodium: 121 mmol/L — ABNORMAL LOW (ref 135–145)
Total Bilirubin: 1.2 mg/dL (ref 0.0–1.2)
Total Protein: 5.5 g/dL — ABNORMAL LOW (ref 6.5–8.1)

## 2023-03-31 LAB — TROPONIN I (HIGH SENSITIVITY): Troponin I (High Sensitivity): 34 ng/L — ABNORMAL HIGH (ref <18)

## 2023-03-31 LAB — MAGNESIUM: Magnesium: 1.5 mg/dL — ABNORMAL LOW (ref 1.7–2.4)

## 2023-03-31 MED ORDER — FUROSEMIDE 10 MG/ML IJ SOLN
20.0000 mg | Freq: Once | INTRAMUSCULAR | Status: AC
  Administered 2023-04-01: 20 mg via INTRAVENOUS
  Filled 2023-03-31: qty 2

## 2023-03-31 MED ORDER — HEPARIN (PORCINE) 25000 UT/250ML-% IV SOLN
1000.0000 [IU]/h | INTRAVENOUS | Status: DC
  Administered 2023-03-31: 1000 [IU]/h via INTRAVENOUS
  Filled 2023-03-31: qty 250

## 2023-03-31 MED ORDER — MAGNESIUM SULFATE 2 GM/50ML IV SOLN
2.0000 g | Freq: Once | INTRAVENOUS | Status: AC
  Administered 2023-03-31: 2 g via INTRAVENOUS
  Filled 2023-03-31: qty 50

## 2023-03-31 MED ORDER — METOPROLOL TARTRATE 12.5 MG HALF TABLET
12.5000 mg | ORAL_TABLET | Freq: Four times a day (QID) | ORAL | Status: DC
  Administered 2023-04-01 – 2023-04-02 (×7): 12.5 mg via ORAL
  Filled 2023-03-31 (×7): qty 1

## 2023-03-31 MED ORDER — DILTIAZEM HCL-DEXTROSE 125-5 MG/125ML-% IV SOLN (PREMIX)
5.0000 mg/h | INTRAVENOUS | Status: DC
  Administered 2023-03-31: 5 mg/h via INTRAVENOUS
  Filled 2023-03-31: qty 125

## 2023-03-31 MED ORDER — SODIUM CHLORIDE 0.9 % IV BOLUS
1000.0000 mL | Freq: Once | INTRAVENOUS | Status: AC
  Administered 2023-03-31: 1000 mL via INTRAVENOUS

## 2023-03-31 MED ORDER — SODIUM CHLORIDE 0.9 % IV SOLN
2.0000 g | Freq: Once | INTRAVENOUS | Status: AC
  Administered 2023-03-31: 2 g via INTRAVENOUS
  Filled 2023-03-31: qty 20

## 2023-03-31 MED ORDER — IOHEXOL 350 MG/ML SOLN
75.0000 mL | Freq: Once | INTRAVENOUS | Status: AC | PRN
  Administered 2023-03-31: 75 mL via INTRAVENOUS

## 2023-03-31 MED ORDER — DILTIAZEM HCL 25 MG/5ML IV SOLN
5.0000 mg | Freq: Once | INTRAVENOUS | Status: AC
  Administered 2023-03-31: 5 mg via INTRAVENOUS
  Filled 2023-03-31: qty 5

## 2023-03-31 MED ORDER — SODIUM CHLORIDE 0.9 % IV SOLN
500.0000 mg | Freq: Once | INTRAVENOUS | Status: AC
  Administered 2023-03-31: 500 mg via INTRAVENOUS
  Filled 2023-03-31: qty 5

## 2023-03-31 MED ORDER — HEPARIN BOLUS VIA INFUSION
3500.0000 [IU] | Freq: Once | INTRAVENOUS | Status: AC
  Administered 2023-03-31: 3500 [IU] via INTRAVENOUS
  Filled 2023-03-31: qty 3500

## 2023-03-31 MED ORDER — FUROSEMIDE 10 MG/ML IJ SOLN
40.0000 mg | Freq: Once | INTRAMUSCULAR | Status: AC
  Administered 2023-03-31: 40 mg via INTRAVENOUS
  Filled 2023-03-31: qty 4

## 2023-03-31 NOTE — Plan of Care (Incomplete)
89 year old female with history of chronic HFmrEF, hypertension, hyperlipidemia, osteoarthritis, scoliosis, chronic pain, essential tremor, RLS, anxiety, seasonal allergies presented to ED via EMS from her independent living facility for evaluation of shortness of breath, cough, and new onset A-fib.  Recently treated with antibiotics for presumed pneumonia.  Found to have A-fib with high heart rate today at her facility.  Vital signs on arrival to the ED: Temperature 97.8 F, pulse 156, blood pressure 137/109, SpO2 100% on room air. EKG showing A-fib with RVR.  Labs notable for WBC count 11.2, sodium 121, potassium 5.5, chloride 91, bicarb 17, glucose 119, BUN 26, creatinine 1.0 (baseline 0.8-0.9), AST 57, ALT 152, alk phos and T. bili normal, COVID/influenza/RSV PCR negative, magnesium 1.5, BNP 1142, initial troponin 34 and repeat pending, lactic acid normal x 2, UA pending, blood cultures collected.    CT angiogram chest showing: "IMPRESSION: 1. No pulmonary embolism. 2. Cardiomegaly with elevated right heart pressures and at least some degree of right heart failure. 3. Moderate right and small left pleural effusions with associated bibasilar compressive atelectasis. 4. Extensive multi-vessel coronary artery calcification. 5. Subpleural sparse pulmonary infiltrate within the right upper lobe, may reflect an infectious or inflammatory infiltrate or post inflammatory scarring, but is indeterminate. Short-term follow-up imaging in 3 months is recommended to document stability or resolution.   Aortic Atherosclerosis (ICD10-I70.0)."  Patient was given IV Cardizem 5 mg, ceftriaxone, azithromycin, IV mag 2 g, and 1 L normal saline.  Cardiology consulted and recommended starting the patient on Cardizem and heparin drip.  IV Lasix ordered.  TRH called to admit.      Echo done in June 2022 showing EF 40 to 45%, normal RV function, mild mitral regurgitation, mild aortic regurgitation.

## 2023-03-31 NOTE — Progress Notes (Signed)
PHARMACY - ANTICOAGULATION CONSULT NOTE  Pharmacy Consult for heparin Indication: atrial fibrillation  Allergies  Allergen Reactions   Fish Allergy    Orange Juice [Orange Oil]    Strawberry Extract    Vicodin [Hydrocodone-Acetaminophen] Other (See Comments)    hallucinatations and nightmares   Erythromycin Diarrhea   Amlodipine     Leg swelling   Fexofenadine     Sedated in the 180 mg dose.   Amoxicillin Nausea And Vomiting   Hydrochlorothiazide Other (See Comments)    Hyponatremia while on HCTZ    Patient Measurements:   Heparin Dosing Weight: TBW  Vital Signs: Temp: 99 F (37.2 C) (02/13 1855) Temp Source: Rectal (02/13 1855) BP: 130/90 (02/13 2233) Pulse Rate: 117 (02/13 2233)  Labs: Recent Labs    03/31/23 1926  HGB 12.8  HCT 38.1  PLT 262  CREATININE 1.06*  TROPONINIHS 34*    CrCl cannot be calculated (Unknown ideal weight.).   Medical History: Past Medical History:  Diagnosis Date   Allergy    Arthritis    Blood transfusion    Hyperlipidemia    Hypertension    Scoliosis      Assessment: 89 YOF presenting with weakness, in afib, she is not on anticoagulation PTA, CBC wnl  Goal of Therapy:  Heparin level 0.3-0.7 units/ml Monitor platelets by anticoagulation protocol: Yes   Plan:  Heparin 3500 units IV x 1, and gtt at 1000 units/hr F/u 8 hour heparin level F/u long term [facility] plan  Daylene Posey, PharmD, [facility] Clinical Pharmacist ED Pharmacist Phone # [phone] 03/31/2023 11:12 PM

## 2023-03-31 NOTE — ED Notes (Signed)
1st lac in normal range 2nd not needed

## 2023-03-31 NOTE — Sepsis Progress Note (Signed)
Elink monitoring for the code sepsis protocol.

## 2023-03-31 NOTE — Consult Note (Signed)
Cardiology Consultation   Patient ID: Gina Bishop MRN: [id number]; DOB: [birth year]  Admit date: 03/31/2023 Date of Consult: 03/31/2023  PCP:  Caro Laroche, DO   [facility] Providers Cardiologist:  None        Patient Profile:   Gina Bishop is a 89 y.o. female with a hx of hypertension and hyperlipidemia who is being seen 03/31/2023 for the evaluation of atrial fibrillation with RVR at the request of Dr. Lockie Mola.  History of Present Illness:   Gina Bishop is an 89 year old female with medical comorbidities of mainly just hypertension on antihypertensive agents for the last few years.  She lives in independent living.  She has been suffering from URI-like symptoms with nasal congestion and cough for the last 3 weeks.  She has been treated with supportive medications and recently started on doxycycline.  At her independent living center, her heart rate was noted to be elevated on 2/11 which persisted until today thus prompting her to present to the emergency room due to concern for new onset atrial fibrillation.  She states that she continues to have cough and congestion which have been persistent.  Denies recent fevers or chills.  Denies any tachypalpitations, chest discomfort, dyspnea.  Denies any new lower extremity edema, orthopnea or PND.  She states that she feels essentially at her baseline outside of her cough/congestion symptoms.  Denies any history of known stroke, MI or heart failure.  She states that she has had 3 major falls in the last few years that are mechanical in nature.  Denies any syncopal events.  Arrival to the emergency room, Tmax 60F, heart rate initially in the 150s, BP 120-130/90-100 and not requiring supplemental oxygen.  CBC with mild leukocytosis to 11.2 otherwise unremarkable.  CMP with Na 121, K5 .5, BUN 26, CR 1.06, AST 57, ALT 152, T. bili 1.2.  Mag 1.5. BNP 1142.  High-sensitivity troponin 34.  Lactic acid 1.1.  CT PE with no evidence of  pulmonary embolism though with evidence of cardiomegaly with elevated right heart pressures and at least some degree of right heart failure, moderate right and left small pleural effusions and right upper lobe pulmonary infiltrate. EKG with atrial fibrillation with ventricular rates in the 150s.  She was given 5 mg of IV diltiazem and started on a drip.  She was started on ceftriaxone azithromycin for bacterial pneumonia.  Past Medical History:  Diagnosis Date   Allergy    Arthritis    Blood transfusion    Hyperlipidemia    Hypertension    Scoliosis     Past Surgical History:  Procedure Laterality Date   ABDOMINAL HYSTERECTOMY     APPENDECTOMY     CESAREAN SECTION     INTRAMEDULLARY (IM) NAIL INTERTROCHANTERIC Left 05/11/2022   Procedure: INTRAMEDULLARY (IM) NAIL INTERTROCHANTERIC;  Surgeon: Jones Broom, MD;  Location: [facility];  Service: Orthopedics;  Laterality: Left;   JOINT REPLACEMENT       Home Medications:  Prior to Admission medications   Medication Sig Start Date End Date Taking? Authorizing Provider  azelastine (ASTELIN) 0.1 % nasal spray Place 1 spray into both nostrils 2 (two) times daily. Use in each nostril as directed 10/19/22   Caro Laroche, DO  benazepril (LOTENSIN) 40 MG tablet TAKE 1 TABLET(40 MG) BY MOUTH AT BEDTIME 10/19/22   Caro Laroche, DO  gabapentin (NEURONTIN) 100 MG capsule TAKE 1 CAPSULE BY MOUTH THREE TIMES DAILY Patient taking differently: Take 100 mg by mouth 2 (  two) times daily. 04/06/21   Moses Manners, MD  pramipexole (MIRAPEX) 0.5 MG tablet TAKE 1 TABLET(0.5 MG) BY MOUTH AT BEDTIME 07/29/21   Moses Manners, MD  pravastatin (PRAVACHOL) 40 MG tablet TAKE 1 TABLET BY MOUTH DAILY Patient taking differently: Take 40 mg by mouth at bedtime. TAKE 1 TABLET BY MOUTH DAILY 01/25/22   Moses Manners, MD  spironolactone (ALDACTONE) 25 MG tablet Take 1 tablet (25 mg total) by mouth daily. 10/19/22   Caro Laroche, DO  spironolactone  (ALDACTONE) 25 MG tablet TAKE 1/2 TABLET(12.5 MG) BY MOUTH AT BEDTIME 09/21/21   Moses Manners, MD    Inpatient Medications: Scheduled Meds:  furosemide  40 mg Intravenous Once   heparin  3,500 Units Intravenous Once   Continuous Infusions:  azithromycin 500 mg (03/31/23 2237)   diltiazem (CARDIZEM) infusion 5 mg/hr (03/31/23 2131)   heparin     magnesium sulfate bolus IVPB 2 g (03/31/23 2239)   PRN Meds:   Allergies:    Allergies  Allergen Reactions   Fish Allergy    Orange Juice [Orange Oil]    Strawberry Extract    Vicodin [Hydrocodone-Acetaminophen] Other (See Comments)    hallucinatations and nightmares   Erythromycin Diarrhea   Amlodipine     Leg swelling   Fexofenadine     Sedated in the 180 mg dose.   Amoxicillin Nausea And Vomiting   Hydrochlorothiazide Other (See Comments)    Hyponatremia while on HCTZ    Social History:   Social History   Socioeconomic History   Marital status: Married    Spouse name: John   Number of children: 3   Years of education: 12   Highest education level: Not on file  Occupational History   Occupation: Retired- Banker  Tobacco Use   Smoking status: Never   Smokeless tobacco: Never  Vaping Use   Vaping status: Never Used  Substance and Sexual Activity   Alcohol use: No   Drug use: No   Sexual activity: Not Currently  Other Topics Concern   Not on file  Social History Narrative   Health Care POA:    Emergency Contact: husband, Jonny Ruiz (c) [phone] or son, Minerva Areola (c) [phone]   End of Life Plan: Pt reports giving living will copy to Dr. Leveda Anna   Who lives with you: husband and son   Any pets: none   Diet: Pt have a varied diet of protein, starch, fruits and vegetables.   Exercise: Pt exercises at [facility] 5-6 times a week. Pt reports participating in chair yoga, low impact aerobic classes, and swimming.   Seatbelts: Pt reports wearing seatbelt when in vehicles.    Wynelle Link Exposure/Protection: Pt reports wearing  sunscreen and hat when outside.   Hobbies: reading and walking at the beach         Current Social History 11/02/2016                                                                                                     Social Drivers of Dispensing optician  Resource Strain: Low Risk  (04/27/2022)   Overall Financial Resource Strain (CARDIA)    Difficulty of Paying Living Expenses: Not hard at all  Food Insecurity: No Food Insecurity (05/11/2022)   Hunger Vital Sign    Worried About Running Out of Food in the Last Year: Never true    Ran Out of Food in the Last Year: Never true  Transportation Needs: No Transportation Needs (05/11/2022)   PRAPARE - Administrator, Civil Service (Medical): No    Lack of Transportation (Non-Medical): No  Physical Activity: Sufficiently Active (04/27/2022)   Exercise Vital Sign    Days of Exercise per Week: 5 days    Minutes of Exercise per Session: 60 min  Stress: No Stress Concern Present (04/27/2022)   [employer] of Occupational Health - Occupational Stress Questionnaire    Feeling of Stress : Not at all  Social Connections: Moderately Isolated (04/27/2022)   Social Connection and Isolation Panel [NHANES]    Frequency of Communication with Friends and Family: More than three times a week    Frequency of Social Gatherings with Friends and Family: More than three times a week    Attends Religious Services: Never    Database administrator or Organizations: Yes    Attends Engineer, structural: More than 4 times per year    Marital Status: Widowed  Intimate Partner Violence: Not At Risk (05/11/2022)   Humiliation, Afraid, Rape, and Kick questionnaire    Fear of Current or Ex-Partner: No    Emotionally Abused: No    Physically Abused: No    Sexually Abused: No    Family History:    Family History  Problem Relation Age of Onset   Kidney disease Mother    Cancer Mother        Kidney   Heart disease Father    Cancer Brother         Colon   Diabetes Son        T1DM   Rosacea Son      ROS:  Please see the history of present illness.   All other ROS reviewed and negative.     Physical Exam/Data:   Vitals:   03/31/23 1917 03/31/23 2030 03/31/23 2100 03/31/23 2233  BP:  (!) 121/94 (!) 123/96 (!) 130/90  Pulse:  (!) 148 (!) 146 (!) 117  Resp:  19 (!) 26 (!) 22  Temp:      TempSrc:      SpO2: 99% 99% 99% 96%    Intake/Output Summary (Last 24 hours) at 03/31/2023 2325 Last data filed at 03/31/2023 2222 Gross per 24 hour  Intake 1100.22 ml  Output --  Net 1100.22 ml      10/19/2022   11:06 AM 05/11/2022    1:49 PM 04/27/2022   11:06 AM  Last 3 Weights  Weight (lbs) 159 lb 6.4 oz 160 lb 7.9 oz 165 lb  Weight (kg) 72.303 kg 72.8 kg 74.844 kg     There is no height or weight on file to calculate BMI.  General:  no acute distress HEENT: normal Neck: JVD mildy elevated Vascular: No carotid bruits; Distal pulses 2+ bilaterally Cardiac:  tachycardic and irregularly irregular Lungs:  diminished breath sounds in the bases Abd: soft, nontender, no hepatomegaly  Ext: trace pretibial edema Musculoskeletal:  No deformities, BUE and BLE strength normal and equal Skin: warm and dry  Neuro:  CNs 2-12 intact, no focal abnormalities noted Psych:  Normal affect  EKG:  The EKG was personally reviewed and demonstrates:  atrial fibrillation with RVR Telemetry:  Telemetry was personally reviewed and demonstrates:  afib RVR  Relevant CV Studies: none  Laboratory Data:  High Sensitivity Troponin:   Recent Labs  Lab 03/31/23 1926  TROPONINIHS 34*     Chemistry Recent Labs  Lab 03/31/23 1926  NA 121*  K 5.5*  CL 91*  CO2 17*  GLUCOSE 119*  BUN 26*  CREATININE 1.06*  CALCIUM 8.7*  MG 1.5*  GFRNONAA 50*  ANIONGAP 13    Recent Labs  Lab 03/31/23 1926  PROT 5.5*  ALBUMIN 3.3*  AST 57*  ALT 152*  ALKPHOS 105  BILITOT 1.2   Lipids No results for input(s): "CHOL", "TRIG", "HDL", "LABVLDL",  "LDLCALC", "CHOLHDL" in the last 168 hours.  Hematology Recent Labs  Lab 03/31/23 1926  WBC 11.2*  RBC 4.40  HGB 12.8  HCT 38.1  MCV 86.6  MCH 29.1  MCHC 33.6  RDW 14.3  PLT 262   Thyroid No results for input(s): "TSH", "FREET4" in the last 168 hours.  BNP Recent Labs  Lab 03/31/23 1926  BNP 1,142.0*    DDimer No results for input(s): "DDIMER" in the last 168 hours.   Radiology/Studies:  CT Angio Chest PE W and/or Wo Contrast Result Date: 03/31/2023 CLINICAL DATA:  High probability pulmonary embolism EXAM: CT ANGIOGRAPHY CHEST WITH CONTRAST TECHNIQUE: Multidetector CT imaging of the chest was performed using the standard protocol during bolus administration of intravenous contrast. Multiplanar CT image reconstructions and MIPs were obtained to evaluate the vascular anatomy. RADIATION DOSE REDUCTION: This exam was performed according to the departmental dose-optimization program which includes automated exposure control, adjustment of the mA and/or kV according to patient size and/or use of iterative reconstruction technique. CONTRAST:  75mL OMNIPAQUE IOHEXOL 350 MG/ML SOLN COMPARISON:  None Available. FINDINGS: Cardiovascular: There is adequate opacification of the pulmonary arterial tree. No intraluminal filling defect identified to suggest acute pulmonary embolism. The central pulmonary arteries are of normal caliber. Extensive multi-vessel coronary artery calcification. Global cardiac size is enlarged. There is particular enlargement the right heart chambers with reflux of contrast into the hepatic venous system in keeping with elevated right heart pressures and at least some degree of right heart failure. No pericardial effusion. Atherosclerotic calcification within the thoracic aorta. No aortic aneurysm. Mediastinum/Nodes: The esophagus is patulous proximal to the crossing left mainstem bronchus. No pathologic thoracic adenopathy. Visualized thyroid is unremarkable. Lungs/Pleura:  Moderate right and small left pleural effusions are present with associated bibasilar compressive atelectasis. Subpleural sparse pulmonary infiltrate within the right upper lobe, axial image # 36/6, may reflect an infectious or inflammatory infiltrate or post inflammatory scarring, but is indeterminate there no pneumothorax. No central obstructing lesion. Upper Abdomen: No acute abnormality. Musculoskeletal: Osseous structures are age-appropriate. No acute bone abnormality Review of the MIP images confirms the above findings. IMPRESSION: 1. No pulmonary embolism. 2. Cardiomegaly with elevated right heart pressures and at least some degree of right heart failure. 3. Moderate right and small left pleural effusions with associated bibasilar compressive atelectasis. 4. Extensive multi-vessel coronary artery calcification. 5. Subpleural sparse pulmonary infiltrate within the right upper lobe, may reflect an infectious or inflammatory infiltrate or post inflammatory scarring, but is indeterminate. Short-term follow-up imaging in 3 months is recommended to document stability or resolution. Aortic Atherosclerosis (ICD10-I70.0). Electronically Signed   By: Helyn Numbers M.D.   On: 03/31/2023 22:29   DG Chest Portable 1 View Result Date: 03/31/2023 CLINICAL  DATA:  Cough for several days. Congestion. New onset atrial fibrillation. EXAM: PORTABLE CHEST 1 VIEW COMPARISON:  05/11/2022 FINDINGS: The patient is rotated to the right on today's radiograph, reducing diagnostic sensitivity and specificity. Moderately reverse lordotic projection. Moderate enlargement of the cardiopericardial silhouette noted with indistinct pulmonary vasculature favoring pulmonary venous hypertension. Obscured left hemidiaphragm compatible with left lower lobe airspace opacity. Layering left effusion not excluded. Central airway thickening noted.  Lower thoracic spondylosis. IMPRESSION: 1. Moderate enlargement of the cardiopericardial silhouette  with indistinct pulmonary vasculature favoring pulmonary venous hypertension. 2. Obscured left hemidiaphragm compatible with left lower lobe airspace opacity, either atelectasis or pneumonia. Layering left effusion not excluded. 3. Central airway thickening. 4. Lower thoracic spondylosis. Electronically Signed   By: Gaylyn Rong M.D.   On: 03/31/2023 19:27     Assessment and Plan:   Atrial fibrillation with RVR, new onset CHA2DS2-VASc 6.  New onset and suspect related to recent URI with possible superimposed bacterial pneumonia.  No known structural heart disease.  No previous history of stroke.  CT with dense coronary calcifications though no previous MI or stent placement.  Imaging with evidence of heart failure, new onset.  Has done well with diltiazem drip for rate control.  Will start metoprolol tartrate 12.5 mg every 6 hours and recommend stopping diltiazem drip around 30 minutes after first dose of metoprolol.  Goal HR < 110 bpm.  Has been asymptomatic in terms of atrial fibrillation with RVR.  Discussed indication for anticoagulation given new onset atrial fibrillation as well as risks and benefits of anticoagulation with a history of falls.  As of right now, she states preference for anticoagulation which I think is reasonable.  She lives in independent living and takes care of herself on a regular basis.  Does not meet dose reduction criteria. - Start heparin drip, dosing per pharmacy - Metoprolol tartrate 12.5 mg every 6 hours and titrate off diltiazem drip 30 minutes after first dose of metoprolol - Magnesium supplementation - Keep K>4, mag>2 - Remain on telemetry - TTE in AM - May require TEE/DCCV pending response to medication, would optimize medically prior to attempt of cardioversion  New onset congestive heart failure URI with possible superimposed bacterial pneumonia Hyponatremia AKI Elevated LFTs Suspect new onset heart failure signs and symptoms related to atrial  fibrillation with RVR.  No known structural heart disease.  Has evidence of congestion both biochemically and on imaging.  Fortunately, has essentially no symptoms of overt heart failure.  She is warm and well-perfused and mentating appropriately.  She is Lasix nave with a mild AKI that is likely related to venous congestion.  Will start with small diuresis and monitor progression. - IV lasix 20mg  now - BB as above - Hold antihypertensive agents for now - daily CMP - strict I/O - daily standing weights - TTE in AM   Risk Assessment/Risk Scores:        New York Heart Association (NYHA) Functional Class NYHA Class II  CHA2DS2-VASc Score =   6  This indicates a  % annual risk of stroke. The patient's score is based upon:           For questions or updates, please contact [facility] Please consult [email or web address] for contact info under    Signed, Aundra Dubin, MD  03/31/2023 11:25 PM

## 2023-03-31 NOTE — ED Triage Notes (Signed)
PT [facility] from [facility], has been sick for about 3 weeks, cough, congestion, has been on antibiotics, a new onset of AFIB, unsure of exact start, staff wasn't able to give a date on the AFIB. Alert and oriented at baseline, walks with a cane, assisted on scene with ambulation.   144/100 Hr 140-160 99% RA CBG 134 20g L hand Temp 97.7 Cap 26

## 2023-03-31 NOTE — ED Provider Notes (Signed)
[place] EMERGENCY DEPARTMENT AT [facility] Provider Note   CSN: [id number] Arrival date & time: 03/31/23  1807     History  Chief Complaint  Patient presents with   Weakness    Gina Bishop is a 89 y.o. female.  Patient here with cough congestion for last couple weeks.  Just finished antibiotics.  Heart rate was elevated today looks to be A-fib.  But no one knows when it really started.  He is not symptomatic from it right now.  She continues to have cough congestion.  She has a history of hypertension high cholesterol.  No history of A-fib.  She does not think she is having any fevers anymore.  She denies any nausea vomit diarrhea.  No leg swelling.  The history is provided by the patient.       Home Medications Prior to Admission medications   Medication Sig Start Date End Date Taking? Authorizing Provider  azelastine (ASTELIN) 0.1 % nasal spray Place 1 spray into both nostrils 2 (two) times daily. Use in each nostril as directed 10/19/22   Caro Laroche, DO  benazepril (LOTENSIN) 40 MG tablet TAKE 1 TABLET(40 MG) BY MOUTH AT BEDTIME 10/19/22   Caro Laroche, DO  gabapentin (NEURONTIN) 100 MG capsule TAKE 1 CAPSULE BY MOUTH THREE TIMES DAILY Patient taking differently: Take 100 mg by mouth 2 (two) times daily. 04/06/21   Moses Manners, MD  pramipexole (MIRAPEX) 0.5 MG tablet TAKE 1 TABLET(0.5 MG) BY MOUTH AT BEDTIME 07/29/21   Moses Manners, MD  pravastatin (PRAVACHOL) 40 MG tablet TAKE 1 TABLET BY MOUTH DAILY Patient taking differently: Take 40 mg by mouth at bedtime. TAKE 1 TABLET BY MOUTH DAILY 01/25/22   Moses Manners, MD  spironolactone (ALDACTONE) 25 MG tablet Take 1 tablet (25 mg total) by mouth daily. 10/19/22   Caro Laroche, DO  spironolactone (ALDACTONE) 25 MG tablet TAKE 1/2 TABLET(12.5 MG) BY MOUTH AT BEDTIME 09/21/21   Moses Manners, MD      Allergies    Fish allergy, Orange juice [orange oil], Strawberry extract, Vicodin  [hydrocodone-acetaminophen], Erythromycin, Amlodipine, Fexofenadine, Amoxicillin, and Hydrochlorothiazide    Review of Systems   Review of Systems  Physical Exam Updated Vital Signs BP (!) 130/90   Pulse (!) 117   Temp 99 F (37.2 C) (Rectal)   Resp (!) 22   SpO2 96%  Physical Exam Vitals and nursing note reviewed.  Constitutional:      General: She is not in acute distress.    Appearance: She is well-developed. She is not ill-appearing.  HENT:     Head: Normocephalic and atraumatic.     Nose: Nose normal.     Mouth/Throat:     Mouth: Mucous membranes are moist.  Eyes:     Extraocular Movements: Extraocular movements intact.     Conjunctiva/sclera: Conjunctivae normal.     Pupils: Pupils are equal, round, and reactive to light.  Cardiovascular:     Rate and Rhythm: Tachycardia present. Rhythm irregular.     Heart sounds: No murmur heard. Pulmonary:     Effort: Pulmonary effort is normal. No respiratory distress.     Breath sounds: Normal breath sounds.  Abdominal:     Palpations: Abdomen is soft.     Tenderness: There is no abdominal tenderness.  Musculoskeletal:        General: No swelling.     Cervical back: Normal range of motion and neck supple.  Skin:  General: Skin is warm and dry.     Capillary Refill: Capillary refill takes less than 2 seconds.  Neurological:     General: No focal deficit present.     Mental Status: She is alert.  Psychiatric:        Mood and Affect: Mood normal.     ED Results / Procedures / Treatments   Labs (all labs ordered are listed, but only abnormal results are displayed) Labs Reviewed  CBC WITH DIFFERENTIAL/PLATELET - Abnormal; Notable for the following components:      Result Value   WBC 11.2 (*)    Neutro Abs 9.2 (*)    Abs Immature Granulocytes 0.08 (*)    All other components within normal limits  COMPREHENSIVE METABOLIC PANEL - Abnormal; Notable for the following components:   Sodium 121 (*)    Potassium 5.5 (*)     Chloride 91 (*)    CO2 17 (*)    Glucose, Bld 119 (*)    BUN 26 (*)    Creatinine, Ser 1.06 (*)    Calcium 8.7 (*)    Total Protein 5.5 (*)    Albumin 3.3 (*)    AST 57 (*)    ALT 152 (*)    GFR, Estimated 50 (*)    All other components within normal limits  MAGNESIUM - Abnormal; Notable for the following components:   Magnesium 1.5 (*)    All other components within normal limits  TROPONIN I (HIGH SENSITIVITY) - Abnormal; Notable for the following components:   Troponin I (High Sensitivity) 34 (*)    All other components within normal limits  RESP PANEL BY RT-PCR (RSV, FLU A&B, COVID)  RVPGX2  CULTURE, BLOOD (ROUTINE X 2)  CULTURE, BLOOD (ROUTINE X 2)  BRAIN NATRIURETIC PEPTIDE  URINALYSIS, ROUTINE W REFLEX MICROSCOPIC  I-STAT CG4 LACTIC ACID, ED  I-STAT CG4 LACTIC ACID, ED  TROPONIN I (HIGH SENSITIVITY)    EKG EKG Interpretation Date/Time:  Thursday March 31 2023 18:16:49 EST Ventricular Rate:  153 PR Interval:    QRS Duration:  99 QT Interval:  286 QTC Calculation: 457 R Axis:   104  Text Interpretation: Atrial fibrillation with rapid V-rate Right axis deviation Low voltage, precordial leads Nonspecific T abnormalities, lateral leads Confirmed by Virgina Norfolk [phone]) on 03/31/2023 6:18:27 PM  Radiology CT Angio Chest PE W and/or Wo Contrast Result Date: 03/31/2023 CLINICAL DATA:  High probability pulmonary embolism EXAM: CT ANGIOGRAPHY CHEST WITH CONTRAST TECHNIQUE: Multidetector CT imaging of the chest was performed using the standard protocol during bolus administration of intravenous contrast. Multiplanar CT image reconstructions and MIPs were obtained to evaluate the vascular anatomy. RADIATION DOSE REDUCTION: This exam was performed according to the departmental dose-optimization program which includes automated exposure control, adjustment of the mA and/or kV according to patient size and/or use of iterative reconstruction technique. CONTRAST:  75mL OMNIPAQUE  IOHEXOL 350 MG/ML SOLN COMPARISON:  None Available. FINDINGS: Cardiovascular: There is adequate opacification of the pulmonary arterial tree. No intraluminal filling defect identified to suggest acute pulmonary embolism. The central pulmonary arteries are of normal caliber. Extensive multi-vessel coronary artery calcification. Global cardiac size is enlarged. There is particular enlargement the right heart chambers with reflux of contrast into the hepatic venous system in keeping with elevated right heart pressures and at least some degree of right heart failure. No pericardial effusion. Atherosclerotic calcification within the thoracic aorta. No aortic aneurysm. Mediastinum/Nodes: The esophagus is patulous proximal to the crossing left mainstem bronchus. No  pathologic thoracic adenopathy. Visualized thyroid is unremarkable. Lungs/Pleura: Moderate right and small left pleural effusions are present with associated bibasilar compressive atelectasis. Subpleural sparse pulmonary infiltrate within the right upper lobe, axial image # 36/6, may reflect an infectious or inflammatory infiltrate or post inflammatory scarring, but is indeterminate there no pneumothorax. No central obstructing lesion. Upper Abdomen: No acute abnormality. Musculoskeletal: Osseous structures are age-appropriate. No acute bone abnormality Review of the MIP images confirms the above findings. IMPRESSION: 1. No pulmonary embolism. 2. Cardiomegaly with elevated right heart pressures and at least some degree of right heart failure. 3. Moderate right and small left pleural effusions with associated bibasilar compressive atelectasis. 4. Extensive multi-vessel coronary artery calcification. 5. Subpleural sparse pulmonary infiltrate within the right upper lobe, may reflect an infectious or inflammatory infiltrate or post inflammatory scarring, but is indeterminate. Short-term follow-up imaging in 3 months is recommended to document stability or  resolution. Aortic Atherosclerosis (ICD10-I70.0). Electronically Signed   By: Helyn Numbers M.D.   On: 03/31/2023 22:29   DG Chest Portable 1 View Result Date: 03/31/2023 CLINICAL DATA:  Cough for several days. Congestion. New onset atrial fibrillation. EXAM: PORTABLE CHEST 1 VIEW COMPARISON:  05/11/2022 FINDINGS: The patient is rotated to the right on today's radiograph, reducing diagnostic sensitivity and specificity. Moderately reverse lordotic projection. Moderate enlargement of the cardiopericardial silhouette noted with indistinct pulmonary vasculature favoring pulmonary venous hypertension. Obscured left hemidiaphragm compatible with left lower lobe airspace opacity. Layering left effusion not excluded. Central airway thickening noted.  Lower thoracic spondylosis. IMPRESSION: 1. Moderate enlargement of the cardiopericardial silhouette with indistinct pulmonary vasculature favoring pulmonary venous hypertension. 2. Obscured left hemidiaphragm compatible with left lower lobe airspace opacity, either atelectasis or pneumonia. Layering left effusion not excluded. 3. Central airway thickening. 4. Lower thoracic spondylosis. Electronically Signed   By: Gaylyn Rong M.D.   On: 03/31/2023 19:27    Procedures .Critical Care  Performed by: Virgina Norfolk, DO Authorized by: Virgina Norfolk, DO   Critical care provider statement:    Critical care time (minutes):  35   Critical care was necessary to treat or prevent imminent or life-threatening deterioration of the following conditions:  Cardiac failure   Critical care was time spent personally by me on the following activities:  Blood draw for specimens, discussions with primary provider, discussions with consultants, evaluation of patient's response to treatment, examination of patient, obtaining history from patient or surrogate, ordering and performing treatments and interventions, ordering and review of laboratory studies, ordering and review of  radiographic studies, pulse oximetry, re-evaluation of patient's condition and review of old charts   Care discussed with: admitting provider       Medications Ordered in ED Medications  azithromycin (ZITHROMAX) 500 mg in sodium chloride 0.9 % 250 mL IVPB (500 mg Intravenous New Bag/Given 03/31/23 2237)  diltiazem (CARDIZEM) 125 mg in dextrose 5% 125 mL (1 mg/mL) infusion (5 mg/hr Intravenous New Bag/Given 03/31/23 2131)  magnesium sulfate IVPB 2 g 50 mL (2 g Intravenous New Bag/Given 03/31/23 2239)  furosemide (LASIX) injection 40 mg (has no administration in time range)  sodium chloride 0.9 % bolus 1,000 mL (0 mLs Intravenous Stopped 03/31/23 2133)  cefTRIAXone (ROCEPHIN) 2 g in sodium chloride 0.9 % 100 mL IVPB (0 g Intravenous Stopped 03/31/23 2222)  diltiazem (CARDIZEM) injection 5 mg (5 mg Intravenous Given 03/31/23 2127)  iohexol (OMNIPAQUE) 350 MG/ML injection 75 mL (75 mLs Intravenous Contrast Given 03/31/23 2218)    ED Course/ Medical Decision Making/ A&P  Medical Decision Making Amount and/or Complexity of Data Reviewed Labs: ordered. Radiology: ordered.  Risk Prescription drug management. Decision regarding hospitalization.   Breyana Chenier is here with shortness of breath cough.  Just treated with antibiotics for presumed pneumonia.  History of hypertension high cholesterol.  She was found to have A-fib with high heart rate today.  Not really sure when this started.  Is not really experiencing palpitations.  Overall EKG shows atrial fibrillation with RVR.  She had a low-grade fever.  Differential diagnosis could be A-fib in the setting of infectious process may be new heart failure she not having any chest pain seems less likely be ACS.  Could be PE overall we will do sepsis/infectious workup start her on antibiotics get troponin BNP chest x-ray first started fluid bolus but now given Lasix starting IV diltiazem bolus and infusion.  Overall chest  x-ray per my review and interpretation looks like may be volume overload versus pneumonia.  A CT scan was obtained that showed no pulmonary embolism but did show evidence in the may be pneumonia as well as heart failure and pleural effusions.  Ultimately I talked with cardiology on the phone Dr. Geraldo Pitter.  He is in agreement with diltiazem anticoagulation with heparin and admission to medicine.  At this time I think she has both A-fib with RVR with heart failure symptoms and with pneumonia.  Lactic acid was normal.  No significant leukocytosis.  Will continue sepsis rule out ACS heart failure workup.  Heart rates improved to the low 100s.  She is not having any chest pain.  She has been updated with the plan will admit to hospitalist for further care.  I reviewed interpreted all labs and imaging.  I talked to family about the plan as well.  This chart was dictated using voice recognition software.  Despite best efforts to proofread,  errors can occur which can change the documentation meaning.         Final Clinical Impression(s) / ED Diagnoses Final diagnoses:  Community acquired pneumonia, unspecified laterality  Acute on chronic heart failure, unspecified heart failure type [facility])  Atrial fibrillation with RVR [facility])    Rx / DC Orders ED Discharge Orders     None         Virgina Norfolk, DO 03/31/23 2305

## 2023-04-01 ENCOUNTER — Other Ambulatory Visit (HOSPITAL_COMMUNITY): Payer: Self-pay

## 2023-04-01 ENCOUNTER — Inpatient Hospital Stay (HOSPITAL_COMMUNITY): Payer: Medicare Other

## 2023-04-01 ENCOUNTER — Telehealth (HOSPITAL_COMMUNITY): Payer: Self-pay | Admitting: Pharmacy Technician

## 2023-04-01 DIAGNOSIS — J189 Pneumonia, unspecified organism: Secondary | ICD-10-CM

## 2023-04-01 DIAGNOSIS — R531 Weakness: Secondary | ICD-10-CM | POA: Diagnosis not present

## 2023-04-01 DIAGNOSIS — I2510 Atherosclerotic heart disease of native coronary artery without angina pectoris: Secondary | ICD-10-CM | POA: Diagnosis present

## 2023-04-01 DIAGNOSIS — E872 Acidosis, unspecified: Secondary | ICD-10-CM | POA: Diagnosis present

## 2023-04-01 DIAGNOSIS — Z515 Encounter for palliative care: Secondary | ICD-10-CM | POA: Diagnosis not present

## 2023-04-01 DIAGNOSIS — I5021 Acute systolic (congestive) heart failure: Secondary | ICD-10-CM | POA: Diagnosis not present

## 2023-04-01 DIAGNOSIS — I2489 Other forms of acute ischemic heart disease: Secondary | ICD-10-CM | POA: Diagnosis present

## 2023-04-01 DIAGNOSIS — I509 Heart failure, unspecified: Secondary | ICD-10-CM | POA: Diagnosis not present

## 2023-04-01 DIAGNOSIS — J90 Pleural effusion, not elsewhere classified: Secondary | ICD-10-CM

## 2023-04-01 DIAGNOSIS — E871 Hypo-osmolality and hyponatremia: Secondary | ICD-10-CM | POA: Diagnosis present

## 2023-04-01 DIAGNOSIS — Z8249 Family history of ischemic heart disease and other diseases of the circulatory system: Secondary | ICD-10-CM | POA: Diagnosis not present

## 2023-04-01 DIAGNOSIS — Z1152 Encounter for screening for COVID-19: Secondary | ICD-10-CM | POA: Diagnosis not present

## 2023-04-01 DIAGNOSIS — N179 Acute kidney failure, unspecified: Secondary | ICD-10-CM | POA: Diagnosis present

## 2023-04-01 DIAGNOSIS — I4891 Unspecified atrial fibrillation: Secondary | ICD-10-CM

## 2023-04-01 DIAGNOSIS — Z7901 Long term (current) use of anticoagulants: Secondary | ICD-10-CM | POA: Diagnosis not present

## 2023-04-01 DIAGNOSIS — E875 Hyperkalemia: Secondary | ICD-10-CM | POA: Diagnosis present

## 2023-04-01 DIAGNOSIS — I5043 Acute on chronic combined systolic (congestive) and diastolic (congestive) heart failure: Secondary | ICD-10-CM | POA: Diagnosis present

## 2023-04-01 DIAGNOSIS — I5041 Acute combined systolic (congestive) and diastolic (congestive) heart failure: Secondary | ICD-10-CM | POA: Diagnosis not present

## 2023-04-01 DIAGNOSIS — I080 Rheumatic disorders of both mitral and aortic valves: Secondary | ICD-10-CM | POA: Diagnosis present

## 2023-04-01 DIAGNOSIS — J159 Unspecified bacterial pneumonia: Secondary | ICD-10-CM | POA: Diagnosis present

## 2023-04-01 DIAGNOSIS — G2581 Restless legs syndrome: Secondary | ICD-10-CM | POA: Diagnosis present

## 2023-04-01 DIAGNOSIS — Z66 Do not resuscitate: Secondary | ICD-10-CM | POA: Diagnosis present

## 2023-04-01 DIAGNOSIS — I110 Hypertensive heart disease with heart failure: Secondary | ICD-10-CM | POA: Diagnosis present

## 2023-04-01 DIAGNOSIS — E7800 Pure hypercholesterolemia, unspecified: Secondary | ICD-10-CM | POA: Diagnosis present

## 2023-04-01 DIAGNOSIS — Z79899 Other long term (current) drug therapy: Secondary | ICD-10-CM | POA: Diagnosis not present

## 2023-04-01 DIAGNOSIS — G8929 Other chronic pain: Secondary | ICD-10-CM | POA: Diagnosis present

## 2023-04-01 DIAGNOSIS — I5082 Biventricular heart failure: Secondary | ICD-10-CM | POA: Diagnosis present

## 2023-04-01 DIAGNOSIS — K761 Chronic passive congestion of liver: Secondary | ICD-10-CM | POA: Diagnosis present

## 2023-04-01 DIAGNOSIS — I700 Atherosclerosis of aorta: Secondary | ICD-10-CM | POA: Diagnosis present

## 2023-04-01 HISTORY — DX: Pneumonia, unspecified organism: J18.9

## 2023-04-01 HISTORY — DX: Pleural effusion, not elsewhere classified: J90

## 2023-04-01 HISTORY — DX: Hypomagnesemia: E83.42

## 2023-04-01 LAB — ECHOCARDIOGRAM COMPLETE
AR max vel: 1.47 cm2
AV Area VTI: 1.31 cm2
AV Area mean vel: 1.41 cm2
AV Mean grad: 5 mm[Hg]
AV Peak grad: 8.9 mm[Hg]
Ao pk vel: 1.49 m/s
Area-P 1/2: 7.29 cm2
Calc EF: 19.7 %
Radius: 0.5 cm
S' Lateral: 4.5 cm
Single Plane A2C EF: 8 %
Single Plane A4C EF: 31.6 %

## 2023-04-01 LAB — COMPREHENSIVE METABOLIC PANEL
ALT: 145 U/L — ABNORMAL HIGH (ref 0–44)
AST: 48 U/L — ABNORMAL HIGH (ref 15–41)
Albumin: 3.1 g/dL — ABNORMAL LOW (ref 3.5–5.0)
Alkaline Phosphatase: 100 U/L (ref 38–126)
Anion gap: 12 (ref 5–15)
BUN: 25 mg/dL — ABNORMAL HIGH (ref 8–23)
CO2: 18 mmol/L — ABNORMAL LOW (ref 22–32)
Calcium: 8.2 mg/dL — ABNORMAL LOW (ref 8.9–10.3)
Chloride: 91 mmol/L — ABNORMAL LOW (ref 98–111)
Creatinine, Ser: 1.06 mg/dL — ABNORMAL HIGH (ref 0.44–1.00)
GFR, Estimated: 50 mL/min — ABNORMAL LOW (ref >60)
Glucose, Bld: 117 mg/dL — ABNORMAL HIGH (ref 70–99)
Potassium: 4.8 mmol/L (ref 3.5–5.1)
Sodium: 121 mmol/L — ABNORMAL LOW (ref 135–145)
Total Bilirubin: 0.9 mg/dL (ref 0.0–1.2)
Total Protein: 5.1 g/dL — ABNORMAL LOW (ref 6.5–8.1)

## 2023-04-01 LAB — LIPID PANEL
Cholesterol: 112 mg/dL (ref 0–200)
HDL: 48 mg/dL (ref >40)
LDL Cholesterol: 53 mg/dL (ref 0–99)
Total CHOL/HDL Ratio: 2.3 {ratio}
Triglycerides: 55 mg/dL (ref <150)
VLDL: 11 mg/dL (ref 0–40)

## 2023-04-01 LAB — BASIC METABOLIC PANEL
Anion gap: 11 (ref 5–15)
BUN: 29 mg/dL — ABNORMAL HIGH (ref 8–23)
CO2: 21 mmol/L — ABNORMAL LOW (ref 22–32)
Calcium: 8.4 mg/dL — ABNORMAL LOW (ref 8.9–10.3)
Chloride: 90 mmol/L — ABNORMAL LOW (ref 98–111)
Creatinine, Ser: 1.23 mg/dL — ABNORMAL HIGH (ref 0.44–1.00)
GFR, Estimated: 42 mL/min — ABNORMAL LOW (ref >60)
Glucose, Bld: 138 mg/dL — ABNORMAL HIGH (ref 70–99)
Potassium: 5 mmol/L (ref 3.5–5.1)
Sodium: 122 mmol/L — ABNORMAL LOW (ref 135–145)

## 2023-04-01 LAB — MAGNESIUM
Magnesium: 1.9 mg/dL (ref 1.7–2.4)
Magnesium: 2.2 mg/dL (ref 1.7–2.4)

## 2023-04-01 LAB — URINALYSIS, ROUTINE W REFLEX MICROSCOPIC
Bilirubin Urine: NEGATIVE
Glucose, UA: NEGATIVE mg/dL
Hgb urine dipstick: NEGATIVE
Ketones, ur: NEGATIVE mg/dL
Leukocytes,Ua: NEGATIVE
Nitrite: NEGATIVE
Protein, ur: NEGATIVE mg/dL
Specific Gravity, Urine: 1.009 (ref 1.005–1.030)
pH: 5 (ref 5.0–8.0)

## 2023-04-01 LAB — TSH: TSH: 1.233 u[IU]/mL (ref 0.350–4.500)

## 2023-04-01 LAB — OSMOLALITY: Osmolality: 268 mosm/kg — ABNORMAL LOW (ref 275–295)

## 2023-04-01 LAB — CBC
HCT: 38.1 % (ref 36.0–46.0)
Hemoglobin: 12.7 g/dL (ref 12.0–15.0)
MCH: 29.3 pg (ref 26.0–34.0)
MCHC: 33.3 g/dL (ref 30.0–36.0)
MCV: 87.8 fL (ref 80.0–100.0)
Platelets: 249 10*3/uL (ref 150–400)
RBC: 4.34 MIL/uL (ref 3.87–5.11)
RDW: 14.3 % (ref 11.5–15.5)
WBC: 12.4 10*3/uL — ABNORMAL HIGH (ref 4.0–10.5)
nRBC: 0 % (ref 0.0–0.2)

## 2023-04-01 LAB — HEPARIN LEVEL (UNFRACTIONATED): Heparin Unfractionated: 0.45 [IU]/mL (ref 0.30–0.70)

## 2023-04-01 LAB — OSMOLALITY, URINE: Osmolality, Ur: 372 mosm/kg (ref 300–900)

## 2023-04-01 LAB — PROCALCITONIN: Procalcitonin: <0.1 ng/mL

## 2023-04-01 LAB — TROPONIN I (HIGH SENSITIVITY): Troponin I (High Sensitivity): 36 ng/L — ABNORMAL HIGH (ref <18)

## 2023-04-01 LAB — SODIUM, URINE, RANDOM: Sodium, Ur: 74 mmol/L

## 2023-04-01 MED ORDER — POLYETHYLENE GLYCOL 3350 17 G PO PACK
17.0000 g | PACK | Freq: Every day | ORAL | Status: DC
  Administered 2023-04-01 – 2023-04-03 (×2): 17 g via ORAL
  Filled 2023-04-01 (×3): qty 1

## 2023-04-01 MED ORDER — AZITHROMYCIN 500 MG IV SOLR
500.0000 mg | INTRAVENOUS | Status: DC
  Administered 2023-04-01: 500 mg via INTRAVENOUS
  Filled 2023-04-01 (×2): qty 5

## 2023-04-01 MED ORDER — MAGNESIUM SULFATE 2 GM/50ML IV SOLN
2.0000 g | Freq: Once | INTRAVENOUS | Status: AC
  Administered 2023-04-01: 2 g via INTRAVENOUS
  Filled 2023-04-01: qty 50

## 2023-04-01 MED ORDER — GABAPENTIN 100 MG PO CAPS
100.0000 mg | ORAL_CAPSULE | Freq: Two times a day (BID) | ORAL | Status: DC
  Administered 2023-04-01 – 2023-04-08 (×15): 100 mg via ORAL
  Filled 2023-04-01 (×15): qty 1

## 2023-04-01 MED ORDER — GUAIFENESIN-DM 100-10 MG/5ML PO SYRP
5.0000 mL | ORAL_SOLUTION | ORAL | Status: DC | PRN
  Administered 2023-04-01: 5 mL via ORAL

## 2023-04-01 MED ORDER — POLYETHYLENE GLYCOL 3350 17 G PO PACK: 17.0000 g | PACK | Freq: Every day | ORAL | Status: DC | PRN

## 2023-04-01 MED ORDER — GUAIFENESIN-DM 100-10 MG/5ML PO SYRP
15.0000 mL | ORAL_SOLUTION | ORAL | Status: DC | PRN
  Filled 2023-04-01: qty 15

## 2023-04-01 MED ORDER — SODIUM CHLORIDE 0.9 % IV SOLN
1.0000 g | INTRAVENOUS | Status: DC
  Administered 2023-04-01: 1 g via INTRAVENOUS
  Filled 2023-04-01: qty 10

## 2023-04-01 MED ORDER — PRAVASTATIN SODIUM 40 MG PO TABS
40.0000 mg | ORAL_TABLET | Freq: Every day | ORAL | Status: DC
  Administered 2023-04-01 – 2023-04-07 (×7): 40 mg via ORAL
  Filled 2023-04-01 (×7): qty 1

## 2023-04-01 MED ORDER — PERFLUTREN LIPID MICROSPHERE
1.0000 mL | INTRAVENOUS | Status: AC | PRN
Start: 2023-04-01 — End: 2023-04-01
  Administered 2023-04-01: 3 mL via INTRAVENOUS

## 2023-04-01 MED ORDER — FUROSEMIDE 10 MG/ML IJ SOLN
20.0000 mg | Freq: Once | INTRAMUSCULAR | Status: AC
  Administered 2023-04-01: 20 mg via INTRAVENOUS
  Filled 2023-04-01: qty 2

## 2023-04-01 MED ORDER — APIXABAN 5 MG PO TABS
5.0000 mg | ORAL_TABLET | Freq: Two times a day (BID) | ORAL | Status: DC
  Administered 2023-04-01 – 2023-04-08 (×14): 5 mg via ORAL
  Filled 2023-04-01 (×14): qty 1

## 2023-04-01 MED ORDER — PRAMIPEXOLE DIHYDROCHLORIDE 0.25 MG PO TABS
0.5000 mg | ORAL_TABLET | Freq: Every day | ORAL | Status: DC
Start: 2023-04-01 — End: 2023-04-08
  Administered 2023-04-01 – 2023-04-07 (×7): 0.5 mg via ORAL
  Filled 2023-04-01 (×8): qty 2

## 2023-04-01 MED ORDER — SENNA 8.6 MG PO TABS
1.0000 | ORAL_TABLET | Freq: Every day | ORAL | Status: DC | PRN
  Administered 2023-04-01 – 2023-04-03 (×3): 8.6 mg via ORAL
  Filled 2023-04-01 (×3): qty 1

## 2023-04-01 NOTE — ED Notes (Signed)
Patient leaving the floor in stable condition, with her belongings, and staff.

## 2023-04-01 NOTE — Assessment & Plan Note (Addendum)
CT A notable with extensive multi-vessel coronary artery calcification  - Lipid panel  -  Start Lipitor 10 mg daily   Chronic conditions:  RLS: Holding home pramipexole  HTN: Holding home spironolactone and benazepril in setting of hyperkalemia on admission

## 2023-04-01 NOTE — Progress Notes (Signed)
   04/01/23 1932  Assess: MEWS Score  Temp 97.7 F (36.5 C)  BP 97/85  MAP (mmHg) 91  Pulse Rate (!) 108  ECG Heart Rate (!) 102  Resp 16  Level of Consciousness Alert  SpO2 94 %  O2 Device Room Air  Assess: MEWS Score  MEWS Temp 0  MEWS Systolic 1  MEWS Pulse 1  MEWS RR 0  MEWS LOC 0  MEWS Score 2  MEWS Score Color Yellow  Assess: if the MEWS score is Yellow or Red  Were vital signs accurate and taken at a resting state? Yes  Does the patient meet 2 or more of the SIRS criteria? Yes  Does the patient have a confirmed or suspected source of infection? Yes  MEWS guidelines implemented  Yes, yellow  Treat  MEWS Interventions Considered administering scheduled or prn medications/treatments as ordered  Take Vital Signs  Increase Vital Sign Frequency  Yellow: Q2hr x1, continue Q4hrs until patient remains green for 12hrs  Escalate  MEWS: Escalate Yellow: Discuss with charge nurse and consider notifying provider and/or RRT  Notify: Charge Nurse/RN  Name of Charge Nurse/RN Best boy, RN  Assess: SIRS CRITERIA  SIRS Temperature  0  SIRS Respirations  0  SIRS Pulse 1  SIRS WBC 0  SIRS Score Sum  1

## 2023-04-01 NOTE — Assessment & Plan Note (Addendum)
Pt admission sodium 121 and 121 again this AM suspected in the setting of fluid overload.  - PM BMP 122  - Urine Osm 268  - Follow Urine Lytes

## 2023-04-01 NOTE — Plan of Care (Signed)
Discussed pt w/ Dr. Loney Loh. FMTS will plan to assume care of patient at 7am with attending Dr. Linwood Dibbles.

## 2023-04-01 NOTE — Progress Notes (Addendum)
Pt's BP 82/67 MAP 73, HR low 100's, pt is A&O x4 no complaints of dizziness. Requested parameters for scheduled Metoprolol. Attempted to notify [facility] with Family Medicine. Instructed to hold medication only when MAP is below 65.  Bari Edward, RN

## 2023-04-01 NOTE — Assessment & Plan Note (Addendum)
Pt presented to the ED with HR initially in the 150s, EKG noted afib RVR, she was given 5 mg of IV dilt and started on a drip. Now off the dilt drip and on metop. Patients rates better controlled, no afib noted on exam or tele during assessment  Cards following, appreciate recs - Continue Metoprolol tartate 12.5 mg q6h  - TSH WNL  - Discontinued Heparin  - Start Elqiuis 5 mg BID

## 2023-04-01 NOTE — Progress Notes (Signed)
Daily Progress Note Intern Pager: [phone]  Patient name: Gina Bishop Medical record number: [id number] Date of birth: 06-14-1931 Age: 89 y.o. Gender: female  Primary Care Provider: Caro Laroche, DO Consultants: Cardiology  Code Status: DNR/DNI   Pt Overview and Major Events to Date:  2/13 Admitted  2/14 Transferred to FMTS  Assessment and Plan:  Gina Bishop is a 89 y.o. female with HTN, HLD, chronic pain who presented with shortness of breath and cough, admitted for new onset afib.   Assessment & Plan Atrial fibrillation with RVR (HCC) Pt presented to the ED with HR initially in the 150s, EKG noted afib RVR, she was given 5 mg of IV dilt and started on a drip. Now off the dilt drip and on metop. Patients rates better controlled, no afib noted on exam or tele during assessment  Cards following, appreciate recs - Continue Metoprolol tartate 12.5 mg q6h  - TSH WNL  - Discontinued Heparin  - Start Elqiuis 5 mg BID  Hyponatremia Pt admission sodium 121 and 121 again this AM suspected in the setting of fluid overload.  - PM BMP 122  - Urine Osm 268  - Follow Urine Lytes  Congestive heart failure (CHF) (HCC) CT PE w/ evidence of cardiomegaly and elevated right heart pressures, moderate bilateral pleural effusions. BNP 1142. Given lasix IV 40 mg x1 and 20 mg x1. Most recent Echo completed 08/13/2021 EF 40-45%, w/ mildly decreased fxn, mild aortic valcve sclerosis, w/o evidence of AS. Repeat ECHO w/ worsening LV function - Echo EF 20-25%, severely decreased LVH fxn, Grade II diastolic dysfxn, no AS - Redose lasix 20 mg IV x1  - PM BMP and Mag   Pleural effusion Ct PE w/ moderate bilateral pleural effusions, diuresed yesterday, patient no complaints of shortness of breath and sating well on RA.   Pneumonia Pt presented with upper respiratory symptoms (cough, congestion), symptoms ongoing for several weeks. CXR w/ left lower lobe opacitiy, suspicious for pnemonia. WBC 11.2  yesterday and uptrending 12.4 AM. Pro cal negative  - Bcx x2 NGTD, prelim result, continue following  - Continue Azithromycin (2/13-)  - Continue Ceftriaxone ( 2/13- )   Hypomagnesemia Pt with admission Mag of 1.5, AM Mag 1.9 - Mag daily  - PM Mag 2.2  - Replete lytes as needed, goal > 2 CAD (coronary artery disease) CT A notable with extensive multi-vessel coronary artery calcification  - Lipid panel  -  Start Lipitor 10 mg daily   Chronic conditions:  RLS: Holding home pramipexole  HTN: Holding home spironolactone and benazepril in setting of hyperkalemia on admission   FEN/GI: Heart Diet  PPx: Eliquis  Dispo: ALF ([facility]) Barriers include clinical stability.   Subjective:  Patient denies any symptoms this morning. Denies lightheadedness or dizziness.   Objective: Temp:  [97.5 F (36.4 C)-99 F (37.2 C)] 97.5 F (36.4 C) (02/14 0517) Pulse Rate:  [65-156] 95 (02/14 0545) Resp:  [18-31] 19 (02/14 0545) BP: (103-137)/(61-109) 115/79 (02/14 0545) SpO2:  [95 %-100 %] 97 % (02/14 0545) Physical Exam: General: No acute distress, non-ill appearing  Cardiovascular: Tachycardic, no murmurs appreciated  Respiratory: NWOB, CTAB  Abdomen: Non tender to palpation, normactive bowel sounds Extremities: Bilateral lower extremity edema +1  Laboratory: Most recent CBC Lab Results  Component Value Date   WBC 12.4 (H) 04/01/2023   HGB 12.7 04/01/2023   HCT 38.1 04/01/2023   MCV 87.8 04/01/2023   PLT 249 04/01/2023   Most  recent BMP    Latest Ref Rng & Units 04/01/2023    5:00 AM  BMP  Glucose 70 - 99 mg/dL 409   BUN 8 - 23 mg/dL 25   Creatinine 8.11 - 1.00 mg/dL 9.14   Sodium 782 - 956 mmol/L 121   Potassium 3.5 - 5.1 mmol/L 4.8   Chloride 98 - 111 mmol/L 91   CO2 22 - 32 mmol/L 18   Calcium 8.9 - 10.3 mg/dL 8.2    Osmolality 213   Imaging/Diagnostic Tests:  CT Angio chest   1. No pulmonary embolism. 2. Cardiomegaly with elevated right heart  pressures and at least some degree of right heart failure. 3. Moderate right and small left pleural effusions with associated bibasilar compressive atelectasis. 4. Extensive multi-vessel coronary artery calcification. 5. Subpleural sparse pulmonary infiltrate within the right upper lobe, may reflect an infectious or inflammatory infiltrate or post inflammatory scarring, but is indeterminate. Short-term follow-up imaging in 3 months is recommended to document stability or resolution.  CXR:   1. Moderate enlargement of the cardiopericardial silhouette with indistinct pulmonary vasculature favoring pulmonary venous hypertension. 2. Obscured left hemidiaphragm compatible with left lower lobe airspace opacity, either atelectasis or pneumonia. Layering left effusion not excluded. 3. Central airway thickening. 4. Lower thoracic spondylosis  ECHO:   1. Compared with the echo in 2022, systolic function is worse. Left ventricular ejection fraction, by estimation, is 20 to 25% . The left ventricle has severely decreased function. The left ventricle demonstrates global hypokinesis. Left ventricular diastolic parameters are consistent with Grade II diastolic dysfunction ( pseudonormalization) . 2. Right ventricular systolic function is normal. The right ventricular size is normal. There is moderately elevated pulmonary artery systolic pressure. 3. Large pleural effusion in the left lateral region. 4. The mitral valve is normal in structure. Mild mitral valve regurgitation. No evidence of mitral stenosis. 5. Tricuspid valve regurgitation is moderate to severe. 6. The aortic valve is tricuspid. There is moderate calcification of the aortic valve. There is moderate thickening of the aortic valve. Aortic valve regurgitation is mild. No aortic stenosis is present. 7. The inferior vena cava is dilated in size with > 50% respiratory variability, suggesting right atrial pressure of 8 mmHg.   Peterson Ao,  MD 04/01/2023, 7:09 AM  PGY-1, [facility] Family [facility] FPTS Intern pager: [phone], text pages welcome Secure chat group [facility] Teaching Service

## 2023-04-01 NOTE — Progress Notes (Signed)
PHARMACY - ANTICOAGULATION CONSULT NOTE  Pharmacy Consult for heparin Indication: atrial fibrillation  Allergies  Allergen Reactions   Fish Allergy    Orange Juice [Orange Oil]    Strawberry Extract    Vicodin [Hydrocodone-Acetaminophen] Other (See Comments)    hallucinatations and nightmares   Erythromycin Diarrhea   Amlodipine     Leg swelling   Fexofenadine     Sedated in the 180 mg dose.   Amoxicillin Nausea And Vomiting   Hydrochlorothiazide Other (See Comments)    Hyponatremia while on HCTZ    Patient Measurements:   Heparin Dosing Weight: TBW  Vital Signs: Temp: 97.5 F (36.4 C) (02/14 0751) Temp Source: Oral (02/14 0751) BP: 120/91 (02/14 0751) Pulse Rate: 114 (02/14 0751)  Labs: Recent Labs    03/31/23 1926 03/31/23 2347 04/01/23 0500 04/01/23 0650  HGB 12.8  --  12.7  --   HCT 38.1  --  38.1  --   PLT 262  --  249  --   HEPARINUNFRC  --   --   --  0.45  CREATININE 1.06*  --  1.06*  --   TROPONINIHS 34* 36*  --   --     CrCl cannot be calculated (Unknown ideal weight.).   Assessment: 66 YOF presenting with weakness, in afib. She is not on anticoagulation PTA.   Heparin level is therapeutic at 0.45 on 1000 units/hr. No bleeding noted, CBC is stable.  Goal of Therapy:  Heparin level 0.3-0.7 units/ml Monitor platelets by anticoagulation protocol: Yes   Plan:  Heparin gtt at 1000 units/hr 6h confirmatory heparin level Daily heparin level, CBC Monitor for s/sx of bleeding F/u long term AC plan  Thank you for involving pharmacy in this patient's care.  Loura Back, PharmD, BCPS Clinical Pharmacist Clinical phone for 04/01/2023 is [phone] 04/01/2023 8:06 AM

## 2023-04-01 NOTE — Assessment & Plan Note (Addendum)
CT PE w/ evidence of cardiomegaly and elevated right heart pressures, moderate bilateral pleural effusions. BNP 1142. Given lasix IV 40 mg x1 and 20 mg x1. Most recent Echo completed 08/13/2021 EF 40-45%, w/ mildly decreased fxn, mild aortic valcve sclerosis, w/o evidence of AS. Repeat ECHO w/ worsening LV function - Echo EF 20-25%, severely decreased LVH fxn, Grade II diastolic dysfxn, no AS - Redose lasix 20 mg IV x1  - PM BMP and Mag

## 2023-04-01 NOTE — H&P (Signed)
History and Physical    Gina Bishop NUU:[id number] DOB: 1931-08-31 DOA: 03/31/2023  PCP: Caro Laroche, DO  Chief Complaint: A-fib  HPI: Gina Bishop is a 89 y.o. female with medical history significant of chronic HFmrEF, hypertension, hyperlipidemia, osteoarthritis, scoliosis, chronic pain, essential tremor, RLS, anxiety, seasonal allergies presented to ED via EMS from her independent living facility for evaluation of shortness of breath, cough, and new onset A-fib.  Recently treated with antibiotics for presumed pneumonia.  Found to have A-fib with high heart rate today at her facility.  Vital signs on arrival to the ED: Temperature 97.8 F, pulse 156, blood pressure 137/109, SpO2 100% on room air. EKG showing A-fib with RVR.  Labs notable for WBC count 11.2, sodium 121, potassium 5.5, chloride 91, bicarb 17, glucose 119, BUN 26, creatinine 1.0 (baseline 0.8-0.9), AST 57, ALT 152, alk phos and T. bili normal, COVID/influenza/RSV PCR negative, magnesium 1.5, BNP 1142, initial troponin 34 and repeat pending, lactic acid normal x 2, UA pending, blood cultures collected.     CT angiogram chest showing: "IMPRESSION: 1. No pulmonary embolism. 2. Cardiomegaly with elevated right heart pressures and at least some degree of right heart failure. 3. Moderate right and small left pleural effusions with associated bibasilar compressive atelectasis. 4. Extensive multi-vessel coronary artery calcification. 5. Subpleural sparse pulmonary infiltrate within the right upper lobe, may reflect an infectious or inflammatory infiltrate or post inflammatory scarring, but is indeterminate. Short-term follow-up imaging in 3 months is recommended to document stability or resolution.   Aortic Atherosclerosis (ICD10-I70.0)."   Patient was given IV Cardizem 5 mg, ceftriaxone, azithromycin, IV mag 2 g, and 1 L normal saline.  Cardiology consulted and recommended starting the patient on Cardizem and heparin drip.   IV Lasix 60 mg given.  TRH called to admit.   Patient states she was sent to the ED from her independent living facility as they thought she had A-fib but she does not have history of A-fib in the past.  She reports having upper respiratory infection symptoms for the past 3 weeks including cough, congestion, and mild shortness of breath.  Denies orthopnea or lower extremity edema.  Denies fevers or chills.  Denies dizziness/lightheadedness, palpitations, or chest pain.  Denies nausea, vomiting, or diarrhea.  Review of Systems:  Review of Systems  All other systems reviewed and are negative.   Past Medical History:  Diagnosis Date   Allergy    Arthritis    Blood transfusion    Hyperlipidemia    Hypertension    Scoliosis     Past Surgical History:  Procedure Laterality Date   ABDOMINAL HYSTERECTOMY     APPENDECTOMY     CESAREAN SECTION     INTRAMEDULLARY (IM) NAIL INTERTROCHANTERIC Left 05/11/2022   Procedure: INTRAMEDULLARY (IM) NAIL INTERTROCHANTERIC;  Surgeon: Jones Broom, MD;  Location: [facility];  Service: Orthopedics;  Laterality: Left;   JOINT REPLACEMENT       reports that she has never smoked. She has never used smokeless tobacco. She reports that she does not drink alcohol and does not use drugs.  Allergies  Allergen Reactions   Fish Allergy    Orange Juice [Orange Oil]    Strawberry Extract    Vicodin [Hydrocodone-Acetaminophen] Other (See Comments)    hallucinatations and nightmares   Erythromycin Diarrhea   Amlodipine     Leg swelling   Fexofenadine     Sedated in the 180 mg dose.   Amoxicillin Nausea And Vomiting   Hydrochlorothiazide Other (  See Comments)    Hyponatremia while on HCTZ    Family History  Problem Relation Age of Onset   Kidney disease Mother    Cancer Mother        Kidney   Heart disease Father    Cancer Brother        Colon   Diabetes Son        T1DM   Rosacea Son     Prior to Admission medications   Medication Sig Start Date  End Date Taking? Authorizing Provider  aspirin 325 MG tablet Take 325 mg by mouth daily.   Yes [provider]  azelastine (ASTELIN) 0.1 % nasal spray Place 1 spray into both nostrils 2 (two) times daily. Use in each nostril as directed 10/19/22   Caro Laroche, DO  benazepril (LOTENSIN) 40 MG tablet TAKE 1 TABLET(40 MG) BY MOUTH AT BEDTIME 10/19/22   Caro Laroche, DO  gabapentin (NEURONTIN) 100 MG capsule TAKE 1 CAPSULE BY MOUTH THREE TIMES DAILY Patient taking differently: Take 100 mg by mouth 2 (two) times daily. 04/06/21   Moses Manners, MD  pramipexole (MIRAPEX) 0.5 MG tablet TAKE 1 TABLET(0.5 MG) BY MOUTH AT BEDTIME 07/29/21   Moses Manners, MD  pravastatin (PRAVACHOL) 40 MG tablet TAKE 1 TABLET BY MOUTH DAILY Patient taking differently: Take 40 mg by mouth at bedtime. TAKE 1 TABLET BY MOUTH DAILY 01/25/22   Moses Manners, MD  spironolactone (ALDACTONE) 25 MG tablet Take 1 tablet (25 mg total) by mouth daily. 10/19/22   Caro Laroche, DO  spironolactone (ALDACTONE) 25 MG tablet TAKE 1/2 TABLET(12.5 MG) BY MOUTH AT BEDTIME 09/21/21   Moses Manners, MD    Physical Exam: Vitals:   03/31/23 2233 03/31/23 2355 04/01/23 0115 04/01/23 0127  BP: (!) 130/90  103/61 103/61  Pulse: (!) 117  (!) 107 (!) 101  Resp: (!) 22  20   Temp:  (!) 97.5 F (36.4 C)    TempSrc:  Oral    SpO2: 96%  98%     Physical Exam Vitals reviewed.  Constitutional:      General: She is not in acute distress. HENT:     Head: Normocephalic and atraumatic.  Eyes:     Extraocular Movements: Extraocular movements intact.  Cardiovascular:     Rate and Rhythm: Tachycardia present. Rhythm irregular.     Pulses: Normal pulses.  Pulmonary:     Effort: Pulmonary effort is normal. No respiratory distress.     Breath sounds: No wheezing or rales.  Abdominal:     General: Bowel sounds are normal. There is no distension.     Palpations: Abdomen is soft.     Tenderness: There is no abdominal  tenderness. There is no guarding.  Musculoskeletal:     Cervical back: Normal range of motion.     Right lower leg: No edema.     Left lower leg: No edema.  Skin:    General: Skin is warm and dry.  Neurological:     General: No focal deficit present.     Mental Status: She is alert and oriented to person, place, and time.     Labs on Admission: I have personally reviewed following labs and imaging studies  CBC: Recent Labs  Lab 03/31/23 1926  WBC 11.2*  NEUTROABS 9.2*  HGB 12.8  HCT 38.1  MCV 86.6  PLT 262   Basic Metabolic Panel: Recent Labs  Lab 03/31/23 1926  NA 121*  K 5.5*  CL 91*  CO2 17*  GLUCOSE 119*  BUN 26*  CREATININE 1.06*  CALCIUM 8.7*  MG 1.5*   GFR: CrCl cannot be calculated (Unknown ideal weight.). Liver Function Tests: Recent Labs  Lab 03/31/23 1926  AST 57*  ALT 152*  ALKPHOS 105  BILITOT 1.2  PROT 5.5*  ALBUMIN 3.3*   No results for input(s): "LIPASE", "AMYLASE" in the last 168 hours. No results for input(s): "AMMONIA" in the last 168 hours. Coagulation Profile: No results for input(s): "INR", "PROTIME" in the last 168 hours. Cardiac Enzymes: No results for input(s): "CKTOTAL", "CKMB", "CKMBINDEX", "TROPONINI" in the last 168 hours. BNP (last 3 results) No results for input(s): "PROBNP" in the last 8760 hours. HbA1C: No results for input(s): "HGBA1C" in the last 72 hours. CBG: No results for input(s): "GLUCAP" in the last 168 hours. Lipid Profile: No results for input(s): "CHOL", "HDL", "LDLCALC", "TRIG", "CHOLHDL", "LDLDIRECT" in the last 72 hours. Thyroid Function Tests: No results for input(s): "TSH", "T4TOTAL", "FREET4", "T3FREE", "THYROIDAB" in the last 72 hours. Anemia Panel: No results for input(s): "VITAMINB12", "FOLATE", "FERRITIN", "TIBC", "IRON", "RETICCTPCT" in the last 72 hours. Urine analysis:    Component Value Date/Time   BILIRUBINUR negative 07/20/2019 1608   BILIRUBINUR NEG 05/08/2015 1000   KETONESUR  small (15) (A) 07/20/2019 1608   PROTEINUR =100 (A) 07/20/2019 1608   PROTEINUR 30 05/08/2015 1000   UROBILINOGEN 0.2 07/20/2019 1608   NITRITE Negative 07/20/2019 1608   NITRITE NEG 05/08/2015 1000   LEUKOCYTESUR Negative 07/20/2019 1608    Radiological Exams on Admission: CT Angio Chest PE W and/or Wo Contrast Result Date: 03/31/2023 CLINICAL DATA:  High probability pulmonary embolism EXAM: CT ANGIOGRAPHY CHEST WITH CONTRAST TECHNIQUE: Multidetector CT imaging of the chest was performed using the standard protocol during bolus administration of intravenous contrast. Multiplanar CT image reconstructions and MIPs were obtained to evaluate the vascular anatomy. RADIATION DOSE REDUCTION: This exam was performed according to the departmental dose-optimization program which includes automated exposure control, adjustment of the mA and/or kV according to patient size and/or use of iterative reconstruction technique. CONTRAST:  75mL OMNIPAQUE IOHEXOL 350 MG/ML SOLN COMPARISON:  None Available. FINDINGS: Cardiovascular: There is adequate opacification of the pulmonary arterial tree. No intraluminal filling defect identified to suggest acute pulmonary embolism. The central pulmonary arteries are of normal caliber. Extensive multi-vessel coronary artery calcification. Global cardiac size is enlarged. There is particular enlargement the right heart chambers with reflux of contrast into the hepatic venous system in keeping with elevated right heart pressures and at least some degree of right heart failure. No pericardial effusion. Atherosclerotic calcification within the thoracic aorta. No aortic aneurysm. Mediastinum/Nodes: The esophagus is patulous proximal to the crossing left mainstem bronchus. No pathologic thoracic adenopathy. Visualized thyroid is unremarkable. Lungs/Pleura: Moderate right and small left pleural effusions are present with associated bibasilar compressive atelectasis. Subpleural sparse  pulmonary infiltrate within the right upper lobe, axial image # 36/6, may reflect an infectious or inflammatory infiltrate or post inflammatory scarring, but is indeterminate there no pneumothorax. No central obstructing lesion. Upper Abdomen: No acute abnormality. Musculoskeletal: Osseous structures are age-appropriate. No acute bone abnormality Review of the MIP images confirms the above findings. IMPRESSION: 1. No pulmonary embolism. 2. Cardiomegaly with elevated right heart pressures and at least some degree of right heart failure. 3. Moderate right and small left pleural effusions with associated bibasilar compressive atelectasis. 4. Extensive multi-vessel coronary artery calcification. 5. Subpleural sparse pulmonary infiltrate within the right upper lobe,  may reflect an infectious or inflammatory infiltrate or post inflammatory scarring, but is indeterminate. Short-term follow-up imaging in 3 months is recommended to document stability or resolution. Aortic Atherosclerosis (ICD10-I70.0). Electronically Signed   By: Helyn Numbers M.D.   On: 03/31/2023 22:29   DG Chest Portable 1 View Result Date: 03/31/2023 CLINICAL DATA:  Cough for several days. Congestion. New onset atrial fibrillation. EXAM: PORTABLE CHEST 1 VIEW COMPARISON:  05/11/2022 FINDINGS: The patient is rotated to the right on today's radiograph, reducing diagnostic sensitivity and specificity. Moderately reverse lordotic projection. Moderate enlargement of the cardiopericardial silhouette noted with indistinct pulmonary vasculature favoring pulmonary venous hypertension. Obscured left hemidiaphragm compatible with left lower lobe airspace opacity. Layering left effusion not excluded. Central airway thickening noted.  Lower thoracic spondylosis. IMPRESSION: 1. Moderate enlargement of the cardiopericardial silhouette with indistinct pulmonary vasculature favoring pulmonary venous hypertension. 2. Obscured left hemidiaphragm compatible with left  lower lobe airspace opacity, either atelectasis or pneumonia. Layering left effusion not excluded. 3. Central airway thickening. 4. Lower thoracic spondylosis. Electronically Signed   By: Gaylyn Rong M.D.   On: 03/31/2023 19:27    Assessment and Plan  New onset A-fib with RVR Cardiology consulting, appreciate recommendations.  CHA2DS2-VASc 6.  Cardiology suspecting new onset A-fib is related to recent URI/ pneumonia.  No evidence of PE on CT angiogram chest.  Rate has improved with Cardizem drip.  Cardiology has started the patient scheduled metoprolol tartrate 12.5 mg every 6 hours and recommending stopping Cardizem drip around 30 minutes after first dose of metoprolol.  Goal heart rate <110 bpm.  Continue heparin drip for anticoagulation.  Magnesium supplementation. Telemetry monitoring and TTE in the morning.  May require TEE/DCCV pending response to medication, cardiology recommending medical optimization prior to attempting cardioversion.  Acute on chronic HFmrEF Bilateral pleural effusions, R>L Last echo done in June 2022 showing EF 40 to 45%, mild mitral valve regurgitation, and mild aortic valve regurgitation.  BNP 1142.  CT showing cardiomegaly with elevated right heart pressures and at least some degree of right heart failure.  Also showing moderate right and small left pleural effusions.  No hypoxia or respiratory distress.  Suspect heart failure exacerbation is related to A-fib with RVR.  Patient was seen by cardiology and received a total of IV Lasix 60 mg in the ED. Monitor intake and output, daily weights, dietary fluid restriction.  Repeat TTE in the morning.  May need thoracentesis for pleural effusion if not improving with diuresis.  Possible pneumonia CT showing subpleural sparse pulmonary infiltrate within the right upper lobe which may reflect an infectious versus inflammatory infiltrate versus postinflammatory scarring.  Borderline leukocytosis on labs, afebrile.  No lactic  acidosis or hypotension.  COVID/influenza/RSV PCR negative.  Continue antibiotics.  Follow-up blood cultures and check procalcitonin level.  Patient will need short-term follow-up imaging in 3 months to assess stability or resolution.  Hypomagnesemia Continue to monitor labs and replace magnesium as needed.  CAD CT with extensive multivessel coronary calcifications although no previous MI or stent placement.  Workup at present not suggestive of ACS.  Cardiology consulting.  Hypervolemic hyponatremia Sodium 121, previously normal on labs done in September 2024.  Patient received IV Lasix.  Continue to monitor sodium level and check serum osmolarity.  Mild hyperkalemia In the setting of benazepril and spironolactone use.  Hold these medications at this time and continue to monitor labs.  Mild normal anion gap metabolic acidosis Likely related to diuretic use.  Continue to monitor labs and replace  bicarb if needed.  Elevated transaminases Likely due to hepatic congestion in the setting of acutely decompensated CHF.  AST 57, ALT 152, alk phos and T. bili normal.  LFTs were normal on previous labs done in March 2024.  No abdominal pain or tenderness.  Continue to monitor LFTs and pursue further workup if not improving.  Avoid hepatotoxic agents.  Mild troponin elevation Likely due to demand ischemia in the setting of CHF exacerbation and new onset A-fib with RVR.  Troponin slightly elevated but stable (34>36).  Patient is not endorsing chest pain.  Cardiology consulting.  Hypertension Continue metoprolol.  Hold benazepril and spironolactone due to hyperkalemia.  Hyperlipidemia RLS Anxiety Pharmacy med rec pending.  DVT prophylaxis: IV heparin gtt Code Status: DNR/DNI (discussed with patient) Family Communication: No family available at this time. Consults called: Cardiology Level of care: Progressive Care Unit Admission status: It is my clinical opinion that admission to INPATIENT is  reasonable and necessary because of the expectation that this patient will require hospital care that crosses at least 2 midnights to treat this condition based on the medical complexity of the problems presented.  Given the aforementioned information, the predictability of an adverse outcome is felt to be significant.  John Giovanni MD [facility]  If 7PM-7AM, please contact night-coverage [email or web address]  04/01/2023, 1:49 AM

## 2023-04-01 NOTE — Progress Notes (Signed)
  Patient Name: Gina Bishop Date of Encounter: 04/01/2023 [facility] Cardiologist: None   Interval Summary  .    CC: Afib RVR  89 year old female with a history of hypertension and hyperlipidemia noted to be in atrial fibrillation with rapid ventricular response on presentation to the ED, URI symptoms for the last 3 weeks treated supportively and recently started doxycycline, at her independent living facility her heart rate was noted to be elevated and persisted therefore presentation to the ED.  No PE noted on CT, independently reviewed, mild RV enlargement, moderate coronary artery calcifications are noted as well as moderate aortic valve calcifications and mild mitral valve calcification.  Bilateral pleural effusion seen right greater than left.  Patient has been started on antibiotics for bacterial pneumonia per primary service for right upper lobe pulmonary infiltrate.  Vital Signs .    Vitals:   04/01/23 0515 04/01/23 0517 04/01/23 0545 04/01/23 0751  BP: 107/73  115/79 (!) 120/91  Pulse: (!) 102  95 (!) 114  Resp:   19 (!) 22  Temp:  (!) 97.5 F (36.4 C)  (!) 97.5 F (36.4 C)  TempSrc:  Oral  Oral  SpO2:   97% 99%    Intake/Output Summary (Last 24 hours) at 04/01/2023 0854 Last data filed at 04/01/2023 0529 Gross per 24 hour  Intake 1398.57 ml  Output 800 ml  Net 598.57 ml      10/19/2022   11:06 AM 05/11/2022    1:49 PM 04/27/2022   11:06 AM  Last 3 Weights  Weight (lbs) 159 lb 6.4 oz 160 lb 7.9 oz 165 lb  Weight (kg) 72.303 kg 72.8 kg 74.844 kg      Telemetry/ECG    Afib rvr rates 110 - Personally Reviewed  Physical Exam .   GEN: No acute distress.   Neck: No JVD Cardiac: iRRR, no murmurs, rubs, or gallops.  Respiratory: Clear to auscultation bilaterally. GI: Soft, nontender, non-distended  MS: 1+ edema  Assessment & Plan .     Community acquired pneumonia, unspecified laterality -Antibiotics per primary service  Acute on chronic heart  failure, unspecified heart failure type (HCC) -Sodium 121, creatinine 1.06, potassium was 5.5 now 4.8, magnesium 1.9.  Mild elevation in LFTs -She has known reduced ejection fraction from echo 2022, repeat today for reevaluation of ejection fraction and right-sided heart pressures.  No significant valvular heart disease was noted on her prior echocardiogram. - she feels better but may benefit from continued diuresis. Will reassess patient with echo available. Will redose lasix 20 mg IV now and evaluate response.   Atrial fibrillation with RVR (HCC) -She is on a heparin infusion per pharmacy with a CHADS2 Vasc score of 6.  Felt to be related to upper respiratory infection and pneumonia -Has been started on metoprolol tartrate 12.5 mg every 6 and diltiazem drip was weaned  Hyponatremia -Sodium historically normal.  Volume restriction recommended in setting of heart failure and hyponatremia, she has been given 2 doses of Lasix 40 mg then 20 mg with no change in sodium level.  For questions or updates, please contact [facility] Please consult [email or web address] for contact info under        Signed, Parke Poisson, MD

## 2023-04-01 NOTE — Assessment & Plan Note (Addendum)
Pt with admission Mag of 1.5, AM Mag 1.9 - Mag daily  - PM Mag 2.2  - Replete lytes as needed, goal > 2

## 2023-04-01 NOTE — Discharge Instructions (Addendum)
Dear Gina Bishop,  Thank you for letting us participate in your care. You were hospitalized for increased heart rate and diagnosed with Atrial fibrillation with RVR (HCC). You were treated with metoprolol, to control your heart rate, and then started on Eliquis, a blood thinner, to keep blood clots from forming. You will want to continue these meds.   POST-HOSPITAL & CARE INSTRUCTIONS Follow up with Cardiology Go to your follow up appointments (listed below)   DOCTOR'S APPOINTMENT   Future Appointments  Date Time Provider Department Center  05/19/2023 10:40 AM [facility] ANNUAL WELLNESS VISIT [facility]    Contact information for after-discharge care     Destination     [facility] .   Service: Skilled Nursing Contact information: [address] [place] [address] [phone]                     Take care and be well!  Family Medicine Teaching Service Inpatient Team [place]  [facility]  [address] [place], [place] [address] [phone]      Information on my medicine - ELIQUIS (apixaban)  Why was Eliquis prescribed for you? Eliquis was prescribed for you to reduce the risk of a blood clot forming that can cause a stroke if you have a medical condition called atrial fibrillation (a type of irregular heartbeat).  What do You need to know about Eliquis ? Take your Eliquis TWICE DAILY - one tablet in the morning and one tablet in the evening with or without food. If you have difficulty swallowing the tablet whole please discuss with your pharmacist how to take the medication safely.  Take Eliquis exactly as prescribed by your doctor and DO NOT stop taking Eliquis without talking to the doctor who prescribed the medication.  Stopping may increase your risk of developing a stroke.  Refill your prescription before you run out.  After discharge, you should have regular check-up appointments with your  healthcare provider that is prescribing your Eliquis.  In the future your dose may need to be changed if your kidney function or weight changes by a significant amount or as you get older.  What do you do if you miss a dose? If you miss a dose, take it as soon as you remember on the same day and resume taking twice daily.  Do not take more than one dose of ELIQUIS at the same time to make up a missed dose.  Important Safety Information A possible side effect of Eliquis is bleeding. You should call your healthcare provider right away if you experience any of the following: Bleeding from an injury or your nose that does not stop. Unusual colored urine (red or dark brown) or unusual colored stools (red or black). Unusual bruising for unknown reasons. A serious fall or if you hit your head (even if there is no bleeding).  Some medicines may interact with Eliquis and might increase your risk of bleeding or clotting while on Eliquis. To help avoid this, consult your healthcare provider or pharmacist prior to using any new prescription or non-prescription medications, including herbals, vitamins, non-steroidal anti-inflammatory drugs (NSAIDs) and supplements.  This website has more information on Eliquis (apixaban): http://www.eliquis.com/eliquis/home

## 2023-04-01 NOTE — Assessment & Plan Note (Addendum)
Pt presented with upper respiratory symptoms (cough, congestion), symptoms ongoing for several weeks. CXR w/ left lower lobe opacitiy, suspicious for pnemonia. WBC 11.2 yesterday and uptrending 12.4 AM. Pro cal negative  - Bcx x2 NGTD, prelim result, continue following  - Continue Azithromycin (2/13-)  - Continue Ceftriaxone ( 2/13- )

## 2023-04-01 NOTE — Assessment & Plan Note (Signed)
Ct PE w/ moderate bilateral pleural effusions, diuresed yesterday, patient no complaints of shortness of breath and sating well on RA.

## 2023-04-01 NOTE — Telephone Encounter (Signed)

## 2023-04-01 NOTE — Progress Notes (Signed)
  Echocardiogram 2D Echocardiogram has been performed.  Leda Roys RDCS 04/01/2023, 10:18 AM

## 2023-04-02 DIAGNOSIS — I4891 Unspecified atrial fibrillation: Secondary | ICD-10-CM | POA: Diagnosis not present

## 2023-04-02 LAB — COMPREHENSIVE METABOLIC PANEL
ALT: 118 U/L — ABNORMAL HIGH (ref 0–44)
AST: 38 U/L (ref 15–41)
Albumin: 2.7 g/dL — ABNORMAL LOW (ref 3.5–5.0)
Alkaline Phosphatase: 91 U/L (ref 38–126)
Anion gap: 9 (ref 5–15)
BUN: 33 mg/dL — ABNORMAL HIGH (ref 8–23)
CO2: 21 mmol/L — ABNORMAL LOW (ref 22–32)
Calcium: 8 mg/dL — ABNORMAL LOW (ref 8.9–10.3)
Chloride: 92 mmol/L — ABNORMAL LOW (ref 98–111)
Creatinine, Ser: 1.29 mg/dL — ABNORMAL HIGH (ref 0.44–1.00)
GFR, Estimated: 40 mL/min — ABNORMAL LOW (ref >60)
Glucose, Bld: 114 mg/dL — ABNORMAL HIGH (ref 70–99)
Potassium: 4.8 mmol/L (ref 3.5–5.1)
Sodium: 122 mmol/L — ABNORMAL LOW (ref 135–145)
Total Bilirubin: 0.6 mg/dL (ref 0.0–1.2)
Total Protein: 4.6 g/dL — ABNORMAL LOW (ref 6.5–8.1)

## 2023-04-02 LAB — BASIC METABOLIC PANEL
Anion gap: 12 (ref 5–15)
BUN: 33 mg/dL — ABNORMAL HIGH (ref 8–23)
CO2: 21 mmol/L — ABNORMAL LOW (ref 22–32)
Calcium: 8.2 mg/dL — ABNORMAL LOW (ref 8.9–10.3)
Chloride: 89 mmol/L — ABNORMAL LOW (ref 98–111)
Creatinine, Ser: 1.3 mg/dL — ABNORMAL HIGH (ref 0.44–1.00)
GFR, Estimated: 39 mL/min — ABNORMAL LOW (ref >60)
Glucose, Bld: 135 mg/dL — ABNORMAL HIGH (ref 70–99)
Potassium: 4.8 mmol/L (ref 3.5–5.1)
Sodium: 122 mmol/L — ABNORMAL LOW (ref 135–145)

## 2023-04-02 LAB — CBC
HCT: 34 % — ABNORMAL LOW (ref 36.0–46.0)
Hemoglobin: 11.4 g/dL — ABNORMAL LOW (ref 12.0–15.0)
MCH: 29 pg (ref 26.0–34.0)
MCHC: 33.5 g/dL (ref 30.0–36.0)
MCV: 86.5 fL (ref 80.0–100.0)
Platelets: 197 10*3/uL (ref 150–400)
RBC: 3.93 MIL/uL (ref 3.87–5.11)
RDW: 14.3 % (ref 11.5–15.5)
WBC: 9.7 10*3/uL (ref 4.0–10.5)
nRBC: 0 % (ref 0.0–0.2)

## 2023-04-02 LAB — MAGNESIUM
Magnesium: 2.1 mg/dL (ref 1.7–2.4)
Magnesium: 2 mg/dL (ref 1.7–2.4)

## 2023-04-02 LAB — CORTISOL: Cortisol, Plasma: 23.3 ug/dL

## 2023-04-02 LAB — CORTISOL-AM, BLOOD: Cortisol - AM: 23.5 ug/dL — ABNORMAL HIGH (ref 6.7–22.6)

## 2023-04-02 MED ORDER — FUROSEMIDE 10 MG/ML IJ SOLN
20.0000 mg | Freq: Once | INTRAMUSCULAR | Status: AC
  Administered 2023-04-02: 20 mg via INTRAVENOUS
  Filled 2023-04-02: qty 2

## 2023-04-02 MED ORDER — FUROSEMIDE 10 MG/ML IJ SOLN
40.0000 mg | Freq: Two times a day (BID) | INTRAMUSCULAR | Status: AC
  Administered 2023-04-02 – 2023-04-03 (×2): 40 mg via INTRAVENOUS
  Filled 2023-04-02 (×2): qty 4

## 2023-04-02 MED ORDER — METOPROLOL TARTRATE 25 MG PO TABS
25.0000 mg | ORAL_TABLET | Freq: Three times a day (TID) | ORAL | Status: DC
  Administered 2023-04-02 – 2023-04-07 (×15): 25 mg via ORAL
  Filled 2023-04-02 (×15): qty 1

## 2023-04-02 MED ORDER — GLYCERIN (LAXATIVE) 2 G RE SUPP
1.0000 | Freq: Once | RECTAL | Status: DC
  Filled 2023-04-02: qty 1

## 2023-04-02 MED ORDER — FUROSEMIDE 10 MG/ML IJ SOLN: 20.0000 mg | Freq: Two times a day (BID) | INTRAMUSCULAR | Status: DC

## 2023-04-02 NOTE — Assessment & Plan Note (Addendum)
CTPE w/ evidence of cardiomegaly and elevated right heart pressures, moderate bilateral pleural effusions.  BNP 1142.  Previous echo in 07/2021 demosntrated EF 40-45% with mildly decreased function, mild aortic valve sclerosis, and G2DD.  Echo repeated during this admission which revealed worsened EF to 20-25%, severely decreased LV function, global hypokinesis, G2DD, mild MVR, moderate aortic valve calcification, and IVC greater than 50% respiratory variability.  Has diuresed modestly well with IV Lasix, cardiology recommended further diuresis. - Redose lasix 20 mg IV x 1 at 1 PM - Lasix 40 mg x 2 doses starting at 6 PM per cardiology - PM BMP and Mag  at 5 PM

## 2023-04-02 NOTE — Progress Notes (Signed)
Patient Name: Gina Bishop Date of Encounter: 04/02/2023 [facility] Cardiologist: None   Interval Summary  .    CC: Afib RVR  89 year old female with a history of hypertension and hyperlipidemia noted to be in atrial fibrillation with rapid ventricular response on presentation to the ED, URI symptoms for the last 3 weeks treated supportively and recently started doxycycline, at her independent living facility her heart rate was noted to be elevated and persisted therefore presentation to the ED.  No PE noted on CT, independently reviewed, mild RV enlargement, moderate coronary artery calcifications are noted as well as moderate aortic valve calcifications and mild mitral valve calcification.  Bilateral pleural effusion seen right greater than left.  Patient has been started on antibiotics for bacterial pneumonia per primary service for right upper lobe pulmonary infiltrate.  Echo: personally reviewed, EF 20%, severely reduced RV function with mild RV enlargement. mod-severe MR, Mod TR, probable moderate low flow low gradient AS, severe LAE, indeterminate diastolic function with short E vel decel time suggesting elevated filling pressures in atrial fibrillation. Moderate left pleural effusion. RA pressure estimate likely 20 mmhg.  Vital Signs .    Vitals:   04/02/23 0440 04/02/23 0632 04/02/23 0739 04/02/23 1145  BP: (!) 123/96 108/89 111/82 117/82  Pulse: (!) 102 (!) 108 (!) 105   Resp: 20   18  Temp: (!) 97.4 F (36.3 C)  97.6 F (36.4 C) 97.6 F (36.4 C)  TempSrc: Axillary   Oral  SpO2: 94%  95%   Weight: 80 kg     Height:        Intake/Output Summary (Last 24 hours) at 04/02/2023 1305 Last data filed at 04/02/2023 0900 Gross per 24 hour  Intake 874.51 ml  Output 200 ml  Net 674.51 ml      04/02/2023    4:40 AM 04/01/2023    3:43 PM 10/19/2022   11:06 AM  Last 3 Weights  Weight (lbs) 176 lb 5.9 oz 175 lb 14.8 oz 159 lb 6.4 oz  Weight (kg) 80 kg 79.8 kg 72.303  kg      Telemetry/ECG    Afib rvr rates 90-100 - Personally Reviewed  Physical Exam .   GEN: No acute distress.   Neck: No JVD Cardiac: iRRR, 2/6 systolic murmur Respiratory: crackles bilaterally GI: Soft, nontender, non-distended  MS: 1+ edema  Assessment & Plan .     Community acquired pneumonia, unspecified laterality -Antibiotics per primary service  Acute on chronic systolic and diastolic heart failure Right heart failure -She has evidence of biventricular dysfunction on echo with elevated RA pressure and increased LV filling pressures, with clinical volume overload, intensify diuretic therapy. - Still with volume on board, echo suggests RA pressure estimate of 20 mmHg yesterday and she has diuresed modestly since with 20 of IV Lasix.  Will increase dose to 40 mg IV, and monitor response x 2 doses.  Atrial fibrillation with RVR (HCC) - She has transition to apixaban 5 mg twice daily per pharmacy with a CHADS2 Vasc score of 6.  Felt to be related to upper respiratory infection and pneumonia -Has been started on metoprolol tartrate 12.5 mg every 6 and diltiazem drip was weaned, I have increased her dose of metoprolol to 25 mg 3 times daily for better rate control. -I have introduced the idea of TEE cardioversion to her as atrial fibrillation is a new diagnosis and she has recently been started on anticoagulation at the start of this hospital stay.  Would recommend restoration of sinus rhythm, she was not keen to have a procedure but will think about this.  This is likely the etiology of her reduced EF as well.  Hyponatremia -Sodium historically normal.  Volume restriction recommended in setting of heart failure and hyponatremia, diuresis as above.  Likely moderate low-flow low gradient aortic valve stenosis -Though gradients are low, stroke-volume index is low and DVI 0.38, might be lower given that the aortic valve signal was not measured at the highest value.  Valve appears  severely calcified and has moderate to severely reduced opening.  Would like to reevaluate this when patient is in sinus rhythm for true gradients, this will need to be followed as an outpatient.  Moderate to severe mitral valve regurgitation -Likely secondary to severe left atrial enlargement and LV dysfunction, treat heart failure, with hopes that this will improve.  Acute kidney injury -Creatinine slowly uptrending and with minimal diuresis, suspect she may still be congested, will increase dose of diuretic as noted above.       For questions or updates, please contact [facility] Please consult [email or web address] for contact info under        Signed, Parke Poisson, MD

## 2023-04-02 NOTE — Hospital Course (Addendum)
Gina Bishop is a 89 y.o.female with a history of HTN, HLD, chronic pain who was admitted to the [facility] Teaching Service at [facility] for new onset A-fib with RVR. Her hospital course is detailed below:  New onset A-fib with RVR Patient presented with dyspnea and cough with heart rates initially in the 150s.  EKG demonstrated A-fib with RVR.  Patient was started on IV Dilt and transition to metoprolol for rate control.  Eliquis was started for anticoagulation. She declined further evaluation or cardioversion per cardiology.  Upon discharge, patient was in rate controlled A fib, was discharged on Eliquis 5 mg BID, metoprolol 75 mg daily.   Hyponatremia Presented with sodium of 121.  Urine studies were performed and was normal. AM cortisol was normal.  Hyponatremia suspected secondary to fluid overload versus SIADH.  Diuresed with IV Lasix and fluid restricted which improved hyponatremia. We do not expect hyponatremia to resolve due to heart function.   Pleural effusions CTPE demonstrated moderate bilateral pleural effusions.  Crackles are present on exam.  Diuresed with IV Lasix.  Patient had no increased oxygen requirement or work of breathing.  Monitor symptomatically.  CHF CT PE demonstrated evidence of cardiomegaly and elevated right heart pressures, moderate bilateral pleural effusions.  BNP 1142 on admission.  Echo was repeated during this admission which revealed worsened EF to 20-25% from previous echo in 07/2021 (40 to 45%).  Also revealed, G2DD, severely decreased LV function, global hypokinesis, mild MVR, moderate aortic valve calcification, IVC greater than 50% respiratory variability.  Diuresed modestly well with IV Lasix.  Cardiology followed and recommended further diuresis which improved her status.  Pneumonia Initially patient did with URI symptoms of cough and congestion which have been ongoing for several weeks.  CXR revealed LLL opacity suspicious for PNA.  Patient was started  on ceftriaxone and azithromycin.  White blood count down trended during admission, she remained afebrile throughout the admission, and no focal findings noted on exam.  Pro-Cal was negative therefore ceftriaxone and azithromycin were discontinued.  CAD CTA notable for extensive multivessel coronary artery calcification.  Lipid panel with LDL of 53.  Continue pravastatin 40 mg daily.  Hypomagnesemia Monitored with mag and repleted as indicated, with goal > 2.  Other chronic conditions were medically managed with home medications and formulary alternatives as necessary:  CAD: Continue home pravastatin RLS: Home pramipexole Hypertension: Holding home spironolactone and benazepril in the setting of hyperkalemia on admission, consider restarting at SNF after palliative discussion  PCP Follow-up Recommendations: Will need close follow up with cardiology for CHF and A fib Follow up BMP for hyponatremia and Mag levels  Continue palliative discussion outpatient

## 2023-04-02 NOTE — Assessment & Plan Note (Addendum)
Pt presented to the ED with HR initially in the 150s, EKG noted afib RVR, she was given 5 mg of IV dilt and started on a drip.  Now off the dilt drip and on metoprolol.  The decline in her EF is likely secondary to her new onset A-fib.  Working towards better rate controlled and rhythm restoration to NSR.  - Cardiology consulted, appreciate recs - Increased Metoprolol tartate to 25 mg TID for better rate control per cardiology - Eliquis 5 mg BID  - Cardiology to consider possible TEE cardioversion which will be further discussed with patient

## 2023-04-02 NOTE — Assessment & Plan Note (Addendum)
Pt admission sodium 121 and 122 this AM. Urine studies were wnl and AM cortisol was normal. Hyponatremia suspected secondary to fluid overload vs SIADH. - Repeat BMP this PM and tomorrow AM - Diurese with IV Lasix - Fluid restriction

## 2023-04-02 NOTE — Assessment & Plan Note (Addendum)
Pt presented with upper respiratory symptoms (cough, congestion), symptoms ongoing for several weeks.  CXR w/ left lower lobe opacitiy, suspicious for pnemonia.  WBC initially up-trended, but then down-trended. Has remained afebrile during the admission, no focal findings noted on exam, and procal negative.  - Bcx x2 NGTD, prelim result, continue following  - Discontinued Azithromycin and CTX

## 2023-04-02 NOTE — Assessment & Plan Note (Addendum)
Initially hypomagnesemic, but improved with repletion as indicated.  Continue to monitor.  - Mag daily  - Replete as indicated, goal > 2

## 2023-04-02 NOTE — Assessment & Plan Note (Addendum)
CTA notable with extensive multi-vessel coronary artery calcification.  Lipid panel with LDL 53.  - Pravastatin 40 mg daily

## 2023-04-02 NOTE — Assessment & Plan Note (Addendum)
Ct PE w/ moderate bilateral pleural effusions, diuresed yesterday, patient no complaints of shortness of breath and sating well on RA. Normal WOB on RA with no labored breathing, but crackles auscultated on exam. - Continue to monitor

## 2023-04-02 NOTE — Progress Notes (Signed)
Daily Progress Note Intern Pager: [phone]  Patient name: Gina Bishop Medical record number: [id number] Date of birth: Jul 17, 1931 Age: 89 y.o. Gender: female  Primary Care Provider: Caro Laroche, DO Consultants: Cardiology Code Status: DNR/DNI  Pt Overview and Major Events to Date:  2/13: Admitted 2/14: Transferred to [facility]  Assessment and Plan: Gina Bishop is a 89 year old female with past medical history of HTN, HLD, chronic pain who presented with dyspnea and cough, and was admitted for new onset A-fib. Assessment & Plan Atrial fibrillation with RVR (HCC) Pt presented to the ED with HR initially in the 150s, EKG noted afib RVR, she was given 5 mg of IV dilt and started on a drip.  Now off the dilt drip and on metoprolol.  The decline in her EF is likely secondary to her new onset A-fib.  Working towards better rate controlled and rhythm restoration to NSR.  - Cardiology consulted, appreciate recs - Increased Metoprolol tartate to 25 mg TID for better rate control per cardiology - Eliquis 5 mg BID  - Cardiology to consider possible TEE cardioversion which will be further discussed with patient Hyponatremia Pt admission sodium 121 and 122 this AM. Urine studies were wnl and AM cortisol was normal. Hyponatremia suspected secondary to fluid overload vs SIADH. - Repeat BMP this PM and tomorrow AM - Diurese with IV Lasix - Fluid restriction Congestive heart failure (CHF) (HCC) CTPE w/ evidence of cardiomegaly and elevated right heart pressures, moderate bilateral pleural effusions.  BNP 1142.  Previous echo in 07/2021 demosntrated EF 40-45% with mildly decreased function, mild aortic valve sclerosis, and G2DD.  Echo repeated during this admission which revealed worsened EF to 20-25%, severely decreased LV function, global hypokinesis, G2DD, mild MVR, moderate aortic valve calcification, and IVC greater than 50% respiratory variability.  Has diuresed modestly well with IV Lasix,  cardiology recommended further diuresis. - Redose lasix 20 mg IV x 1 at 1 PM - Lasix 40 mg x 2 doses starting at 6 PM per cardiology - PM BMP and Mag  at 5 PM Pleural effusion Ct PE w/ moderate bilateral pleural effusions, diuresed yesterday, patient no complaints of shortness of breath and sating well on RA. Normal WOB on RA with no labored breathing, but crackles auscultated on exam. - Continue to monitor Pneumonia Pt presented with upper respiratory symptoms (cough, congestion), symptoms ongoing for several weeks.  CXR w/ left lower lobe opacitiy, suspicious for pnemonia.  WBC initially up-trended, but then down-trended. Has remained afebrile during the admission, no focal findings noted on exam, and procal negative.  - Bcx x2 NGTD, prelim result, continue following  - Discontinued Azithromycin and CTX  Hypomagnesemia Initially hypomagnesemic, but improved with repletion as indicated.  Continue to monitor.  - Mag daily  - Replete as indicated, goal > 2 CAD (coronary artery disease) CTA notable with extensive multi-vessel coronary artery calcification.  Lipid panel with LDL 53.  - Pravastatin 40 mg daily  Chronic and Stable Problems: RLS: Holding home pramipexole  HTN: Holding home spironolactone and benazepril in setting of hyperkalemia on admission   FEN/GI: Heart healthy diet PPx: Eliquis Dispo: ALF ([facility]) pending clinical improvement  Subjective:  Patient is doing well this morning.  She reports having improvement in her vision at this time.  She denies having increased work of breathing.  Objective: Temp:  [97.3 F (36.3 C)-97.8 F (36.6 C)] 97.6 F (36.4 C) (02/15 1145) Pulse Rate:  [50-112] 105 (02/15 0739) Resp:  [  16-31] 18 (02/15 1145) BP: (82-123)/(62-97) 117/82 (02/15 1145) SpO2:  [94 %-99 %] 95 % (02/15 0739) Weight:  [79.8 kg-80 kg] 80 kg (02/15 0440) Physical Exam: General: Awake and alert in NAD Cardiovascular: Irregularly irregular.   Tachycardic.  No M/R/G. Respiratory: CTAB.  Normal WOB on RA. Abdomen: Soft, nontender, nondistended.  Normoactive bowel sounds. Extremities: No BLE edema.  Able to move all extremities.  Laboratory: Most recent CBC Lab Results  Component Value Date   WBC 9.7 04/02/2023   HGB 11.4 (L) 04/02/2023   HCT 34.0 (L) 04/02/2023   MCV 86.5 04/02/2023   PLT 197 04/02/2023   Most recent BMP    Latest Ref Rng & Units 04/02/2023    4:32 AM  BMP  Glucose 70 - 99 mg/dL 161   BUN 8 - 23 mg/dL 33   Creatinine 0.96 - 1.00 mg/dL 0.45   Sodium 409 - 811 mmol/L 122   Potassium 3.5 - 5.1 mmol/L 4.8   Chloride 98 - 111 mmol/L 92   CO2 22 - 32 mmol/L 21   Calcium 8.9 - 10.3 mg/dL 8.0    AM Cortisol (914 AM): 23.5 and 23.3 Urine osmolality: 372 Urine sodium: 74 Mag: 2.1  Imaging/Diagnostic Tests: No new imaging.  Fortunato Curling, DO 04/02/2023, 2:33 PM  PGY-1, [facility] FPTS Intern pager: [phone], text pages welcome Secure chat group [facility] Teaching Service

## 2023-04-02 NOTE — Evaluation (Addendum)
Physical Therapy Evaluation Patient Details Name: Gina Bishop MRN: [id number] DOB: Jun 22, 1931 Today's Date: 04/02/2023  History of Present Illness  Pt is an 89 y.o. female who presented 03/31/23 with SOB and cough. Pt admitted with new onset A-fib, CHF, pleural effusion, and PNA. PMH: HTN, HLD, chronic pain, scoliosis   Clinical Impression  Pt presents with condition above and deficits mentioned below, see PT Problem List. PTA, she was mod I utilizing a rollator for functional mobility, living in an ILF. Currently, pt displays deficits in gross strength, balance, power, and activity tolerance/endurance. She requires extra time for all mobility but was able to perform bed mobility, transfers, and ambulate up to ~35 ft with a RW with CGA for safety only. She is at risk for falls, but verbalizes understanding her risks. She will likely progress well once she mobilizes more frequently. Will continue to follow acutely and recommend follow-up with HHPT at her ILF.  HR max 131 bpm  BP- 113/65 (78) supine @ 13:39 112/96 (103) sitting, 110/86 (95) standing 106/79 (89) sitting end of session *pt reported lightheadedness and became less talkative with positional changes       If plan is discharge home, recommend the following: Assistance with cooking/housework;Assist for transportation   Can travel by private vehicle        Equipment Recommendations None recommended by PT  Recommendations for Other Services  OT consult    Functional Status Assessment Patient has had a recent decline in their functional status and demonstrates the ability to make significant improvements in function in a reasonable and predictable amount of time.     Precautions / Restrictions Precautions Precautions: Fall Restrictions Weight Bearing Restrictions Per Provider Order: No      Mobility  Bed Mobility Overal bed mobility: Needs Assistance Bed Mobility: Supine to Sit     Supine to sit: Contact guard, HOB  elevated, Used rails     General bed mobility comments: Extra time needed to transition supine to sit L EOB using rails with HOB elevated, CGA for safety    Transfers Overall transfer level: Needs assistance Equipment used: Rolling walker (2 wheels) Transfers: Sit to/from Stand Sit to Stand: Contact guard assist           General transfer comment: Pt needed x2 attempts, extra time, and VCs for hand placement to slowly power up to stand from EOB to RW successfully, no LOB but some unsteadiness noted, CGA for safety    Ambulation/Gait Ambulation/Gait assistance: Contact guard assist Gait Distance (Feet): 35 Feet Assistive device: Rolling walker (2 wheels) Gait Pattern/deviations: Step-through pattern, Decreased stride length, Trunk flexed Gait velocity: reduced Gait velocity interpretation: <1.31 ft/sec, indicative of household ambulator   General Gait Details: Pt ambulates with small, slow steps and a mildly flexed posture. No LOB, CGA for safety  Stairs            Wheelchair Mobility     Tilt Bed    Modified Rankin (Stroke Patients Only)       Balance Overall balance assessment: Needs assistance Sitting-balance support: No upper extremity supported, Feet supported Sitting balance-Leahy Scale: Good Sitting balance - Comments: Able to sit EOB without LOB   Standing balance support: Bilateral upper extremity supported, Reliant on assistive device for balance, During functional activity Standing balance-Leahy Scale: Poor Standing balance comment: Reliant on RW  Pertinent Vitals/Pain Pain Assessment Pain Assessment: No/denies pain    Home Living Family/patient expects to be discharged to:: Other (Comment) (ILF)                   Additional Comments: pt has RW, rollator, w/c, SPC, shower chair, 3in1, grab bars in shower and next to toilet    Prior Function Prior Level of Function : Independent/Modified  Independent             Mobility Comments: Mod I, ambulates with a rollator ADLs Comments: Independent with bathing and dressing. Can do light cooking, like microwave use and coffee. Pt goes to dining room for meals. Staff does the cleaning. Was driving up until her recent hip injury     Extremity/Trunk Assessment   Upper Extremity Assessment Upper Extremity Assessment: Defer to OT evaluation    Lower Extremity Assessment Lower Extremity Assessment: Generalized weakness    Cervical / Trunk Assessment Cervical / Trunk Assessment: Kyphotic (mild)  Communication   Communication Communication: No apparent difficulties    Cognition Arousal: Alert Behavior During Therapy: WFL for tasks assessed/performed   PT - Cognitive impairments: No apparent impairments                         Following commands: Intact       Cueing Cueing Techniques: Verbal cues     General Comments General comments (skin integrity, edema, etc.): HR max 131 bpm; BP 113/65 (78) supine @ 13:39, 112/96 (103) sitting, 110/86 (95) standing, 106/79 (89) sitting end of session; pt reported lightheadedness and became less talkative with positional changes    Exercises     Assessment/Plan    PT Assessment Patient needs continued PT services  PT Problem List Decreased strength;Decreased activity tolerance;Decreased balance;Decreased mobility;Cardiopulmonary status limiting activity       PT Treatment Interventions DME instruction;Gait training;Functional mobility training;Therapeutic exercise;Therapeutic activities;Neuromuscular re-education;Balance training;Patient/family education    PT Goals (Current goals can be found in the Care Plan section)  Acute Rehab PT Goals Patient Stated Goal: to get better PT Goal Formulation: With patient/family Time For Goal Achievement: 04/16/23 Potential to Achieve Goals: Good    Frequency Min 1X/week     Co-evaluation               AM-PAC PT  "6 Clicks" Mobility  Outcome Measure Help needed turning from your back to your side while in a flat bed without using bedrails?: A Little Help needed moving from lying on your back to sitting on the side of a flat bed without using bedrails?: A Little Help needed moving to and from a bed to a chair (including a wheelchair)?: A Little Help needed standing up from a chair using your arms (e.g., wheelchair or bedside chair)?: A Little Help needed to walk in hospital room?: A Little Help needed climbing 3-5 steps with a railing? : A Lot 6 Click Score: 17    End of Session Equipment Utilized During Treatment: Gait belt Activity Tolerance: Patient tolerated treatment well Patient left: in chair;with call bell/phone within reach;with chair alarm set Nurse Communication: Mobility status PT Visit Diagnosis: Unsteadiness on feet (R26.81);Other abnormalities of gait and mobility (R26.89);Muscle weakness (generalized) (M62.81);Difficulty in walking, not elsewhere classified (R26.2);Dizziness and giddiness (R42)    Time: 1357-1430 PT Time Calculation (min) (ACUTE ONLY): 33 min   Charges:   PT Evaluation $PT Eval Moderate Complexity: 1 Mod PT Treatments $Therapeutic Activity: 8-22 mins PT General Charges $$  ACUTE PT VISIT: 1 Visit         Virgil Benedict, PT, DPT Acute Rehabilitation Services  Office: [phone]   Bettina Gavia 04/02/2023, 3:00 PM

## 2023-04-03 DIAGNOSIS — I4891 Unspecified atrial fibrillation: Secondary | ICD-10-CM | POA: Diagnosis not present

## 2023-04-03 DIAGNOSIS — N179 Acute kidney failure, unspecified: Secondary | ICD-10-CM | POA: Insufficient documentation

## 2023-04-03 LAB — COMPREHENSIVE METABOLIC PANEL
ALT: 108 U/L — ABNORMAL HIGH (ref 0–44)
AST: 33 U/L (ref 15–41)
Albumin: 2.9 g/dL — ABNORMAL LOW (ref 3.5–5.0)
Alkaline Phosphatase: 100 U/L (ref 38–126)
Anion gap: 10 (ref 5–15)
BUN: 35 mg/dL — ABNORMAL HIGH (ref 8–23)
CO2: 20 mmol/L — ABNORMAL LOW (ref 22–32)
Calcium: 7.8 mg/dL — ABNORMAL LOW (ref 8.9–10.3)
Chloride: 92 mmol/L — ABNORMAL LOW (ref 98–111)
Creatinine, Ser: 1.34 mg/dL — ABNORMAL HIGH (ref 0.44–1.00)
GFR, Estimated: 38 mL/min — ABNORMAL LOW (ref >60)
Glucose, Bld: 111 mg/dL — ABNORMAL HIGH (ref 70–99)
Potassium: 4.3 mmol/L (ref 3.5–5.1)
Sodium: 122 mmol/L — ABNORMAL LOW (ref 135–145)
Total Bilirubin: 0.6 mg/dL (ref 0.0–1.2)
Total Protein: 4.8 g/dL — ABNORMAL LOW (ref 6.5–8.1)

## 2023-04-03 LAB — CBC
HCT: 35.3 % — ABNORMAL LOW (ref 36.0–46.0)
Hemoglobin: 11.9 g/dL — ABNORMAL LOW (ref 12.0–15.0)
MCH: 29.2 pg (ref 26.0–34.0)
MCHC: 33.7 g/dL (ref 30.0–36.0)
MCV: 86.5 fL (ref 80.0–100.0)
Platelets: 218 10*3/uL (ref 150–400)
RBC: 4.08 MIL/uL (ref 3.87–5.11)
RDW: 14.3 % (ref 11.5–15.5)
WBC: 9.8 10*3/uL (ref 4.0–10.5)
nRBC: 0 % (ref 0.0–0.2)

## 2023-04-03 LAB — MAGNESIUM: Magnesium: 1.9 mg/dL (ref 1.7–2.4)

## 2023-04-03 MED ORDER — POLYETHYLENE GLYCOL 3350 17 G PO PACK
17.0000 g | PACK | Freq: Two times a day (BID) | ORAL | Status: DC
  Administered 2023-04-03 – 2023-04-07 (×9): 17 g via ORAL
  Filled 2023-04-03 (×9): qty 1

## 2023-04-03 MED ORDER — FUROSEMIDE 10 MG/ML IJ SOLN
80.0000 mg | Freq: Two times a day (BID) | INTRAMUSCULAR | Status: AC
  Administered 2023-04-03 (×2): 80 mg via INTRAVENOUS
  Filled 2023-04-03 (×2): qty 8

## 2023-04-03 MED ORDER — SENNA 8.6 MG PO TABS
1.0000 | ORAL_TABLET | Freq: Two times a day (BID) | ORAL | Status: DC
  Administered 2023-04-03 – 2023-04-08 (×10): 8.6 mg via ORAL
  Filled 2023-04-03 (×10): qty 1

## 2023-04-03 MED ORDER — MAGNESIUM SULFATE IN D5W 1-5 GM/100ML-% IV SOLN
1.0000 g | Freq: Once | INTRAVENOUS | Status: AC
  Administered 2023-04-03: 1 g via INTRAVENOUS
  Filled 2023-04-03: qty 100

## 2023-04-03 NOTE — Assessment & Plan Note (Addendum)
Sodium continues to be stable at 122.  Serum osmol and urine studies indicative of SIADH vs hypervolemic hyponatremia.  - UA, urine sodium and urine Cr  - Will continue to follow with daily BMP - Diurese with IV Lasix - Fluid restriction

## 2023-04-03 NOTE — Progress Notes (Signed)
Notified by CCMD patient had a 3 beat run of Vtach nonsustained. Patient asymptomatic. Will continue to monitor.

## 2023-04-03 NOTE — Assessment & Plan Note (Addendum)
Patient is status post IV Dilt drip and now on metoprolol.  - Cardiology consulted, appreciate recs -Continue metoprolol tartate to 25 mg TID for better rate control per cardiology - Eliquis 5 mg BID  - Cardiology to consider possible TEE cardioversion which will be further discussed with patient

## 2023-04-03 NOTE — Assessment & Plan Note (Deleted)
CTA notable with extensive multi-vessel coronary artery calcification.  Lipid panel with LDL 53.  - Pravastatin 40 mg daily

## 2023-04-03 NOTE — Progress Notes (Signed)
Patient Name: Gina Bishop Date of Encounter: 04/03/2023 [facility] Cardiologist: None   Interval Summary  .    CC: Afib RVR. UOP suboptimal with current lasix dosing. Will give additional 80 mg IV lasix now in addition to morning 40 and monitor response. Given biventricular dysfunction, if lasix challenge doesn't work we may need to consider PICC/COOX and possibly inotrope to diurese. She appears more volume overloaded today.   89 year old female with a history of hypertension and hyperlipidemia noted to be in atrial fibrillation with rapid ventricular response on presentation to the ED, URI symptoms for the last 3 weeks treated supportively and recently started doxycycline, at her independent living facility her heart rate was noted to be elevated and persisted therefore presentation to the ED.  No PE noted on CT, independently reviewed, mild RV enlargement, moderate coronary artery calcifications are noted as well as moderate aortic valve calcifications and mild mitral valve calcification.  Bilateral pleural effusion seen right greater than left.  Patient has been started on antibiotics for bacterial pneumonia per primary service for right upper lobe pulmonary infiltrate.  Echo: personally reviewed, EF 20%, severely reduced RV function with mild RV enlargement. mod-severe MR, Mod TR, probable moderate low flow low gradient AS, severe LAE, indeterminate diastolic function with short E vel decel time suggesting elevated filling pressures in atrial fibrillation. Moderate left pleural effusion. RA pressure estimate likely 20 mmhg.  Vital Signs .    Vitals:   04/02/23 1658 04/02/23 1924 04/03/23 0505 04/03/23 0700  BP: 97/84 98/69 109/76 102/77  Pulse: 90 98 100 (!) 116  Resp: 18 19 16 17   Temp: 97.6 F (36.4 C) 97.7 F (36.5 C) (!) 97.5 F (36.4 C) 97.6 F (36.4 C)  TempSrc: Oral Oral Oral Oral  SpO2: 96% 94% 95% 95%  Weight:   79.5 kg   Height:        Intake/Output  Summary (Last 24 hours) at 04/03/2023 0849 Last data filed at 04/03/2023 0507 Gross per 24 hour  Intake 724 ml  Output 1050 ml  Net -326 ml      04/03/2023    5:05 AM 04/02/2023    4:40 AM 04/01/2023    3:43 PM  Last 3 Weights  Weight (lbs) 175 lb 4.3 oz 176 lb 5.9 oz 175 lb 14.8 oz  Weight (kg) 79.5 kg 80 kg 79.8 kg      Telemetry/ECG    Afib rvr rates 100s- Personally Reviewed  Physical Exam .   GEN: No acute distress.   Neck: No JVD Cardiac: iRRR, 2/6 systolic murmur Respiratory: crackles bilaterally GI: Soft, nontender, non-distended  MS: 2 + edema  Assessment & Plan .     Community acquired pneumonia, unspecified laterality -Antibiotics per primary service  Acute on chronic systolic and diastolic heart failure Right heart failure -She has evidence of biventricular dysfunction on echo with elevated RA pressure and increased LV filling pressures, with clinical volume overload, intensify diuretic therapy further. - Still with volume on board, worsening today, echo suggests RA pressure estimate of 20 mmHg and elevated LVEDP and she has diuresed modestly with both 20 and 40 mg of IV Lasix.  Will give lasix challenge now and add 80 mg IV lasix to her 40 mg given earlier and monitor response. I have ordered a second dose for the PM but this can be adjusted as needed. If no improvement, we may need to consider PICC for coox.   Atrial fibrillation with RVR (HCC) - She has  transition to apixaban 5 mg twice daily per pharmacy with a CHADS2 Vasc score of 6.  Felt to be related to upper respiratory infection and pneumonia -Has been started on metoprolol tartrate 12.5 mg every 6 and diltiazem drip was weaned, I have increased her dose of metoprolol to 25 mg 3 times daily for better rate control. -I have introduced the idea of TEE cardioversion to her as atrial fibrillation is a new diagnosis and she has recently been started on anticoagulation at the start of this hospital stay.  Would  recommend restoration of sinus rhythm, she was not keen to have a procedure but will think about this.  This is likely the etiology of her reduced EF as well. Need to diurese well first though.   Hyponatremia -Sodium historically normal.  Volume restriction recommended in setting of heart failure and hyponatremia, diuresis as above.   Likely moderate low-flow low gradient aortic valve stenosis -Though gradients are low, stroke-volume index is low and DVI 0.38, might be lower given that the aortic valve signal was not measured at the highest value.  Valve appears severely calcified and has moderate to severely reduced opening.  Would like to reevaluate this when patient is in sinus rhythm for true gradients, this will need to be followed as an outpatient.  Moderate to severe mitral valve regurgitation -Likely secondary to severe left atrial enlargement and LV dysfunction, treat heart failure, with hopes that this will improve.  Acute kidney injury -Creatinine slowly uptrending and with minimal diuresis, suspect she may still be congested, will increase dose of diuretic as noted above.       For questions or updates, please contact  HeartCare Please consult [email or web address] for contact info under        Signed, Parke Poisson, MD

## 2023-04-03 NOTE — Progress Notes (Signed)
Daily Progress Note Intern Pager: [phone]  Patient name: Gina Bishop Medical record number: [id number] Date of birth: [birth year] Age: 89 y.o. Gender: female  Primary Care Provider: Caro Laroche, DO Consultants: Neurology Code Status: DNR/DNI  Pt Overview and Major Events to Date:  2/13: Admitted  Assessment and Plan: Patient is a 89 year old female with past medical history of hypertension, hyperlipidemia, chronic pain who is admitted for shortness of breath and cough likely in setting of new onset A-fib.  Assessment & Plan Atrial fibrillation with RVR (HCC) Patient is status post IV Dilt drip and now on metoprolol.  - Cardiology consulted, appreciate recs -Continue metoprolol tartate to 25 mg TID for better rate control per cardiology - Eliquis 5 mg BID  - Cardiology to consider possible TEE cardioversion which will be further discussed with patient Hyponatremia Sodium continues to be stable at 122.  Serum osmol and urine studies indicative of SIADH vs hypervolemic hyponatremia.  - UA, urine sodium and urine Cr  - Will continue to follow with daily BMP - Diurese with IV Lasix - Fluid restriction Congestive heart failure (CHF) (HCC) UOP ~1L yesterday. S/p 1 dose of Lasix 40mg  IV overnight. Due for AM dose.  - due for 1 more lasix IV 40 mg dose this AM, per cards recs will add 80 mg IV lasix for this AM, with PM dose ordered. Will adjust as needed  - daily BMP  - K> 4, Mag > 2  AKI (acute kidney injury) (HCC) Cr uptrending from yesterday. - UA, urine sodium and urine Cr  - AM BMP   Chronic and Stable Problems:  RLS: Holding home pramipexole  HTN: Holding home spironolactone and benazepril in setting of hyperkalemia on admission  CAD: continue pravastatin 40mg  daily    FEN/GI: HH diet  PPx: eliquis  Dispo: patients ALF pending clinical improvement  Subjective:  Patient reports she is frustrated with all the medications she has to take. She feels like she  isnt getting much better over the past three days. Denies any current chest pain or trouble breathing.   Objective: Temp:  [97.5 F (36.4 C)-97.7 F (36.5 C)] 97.6 F (36.4 C) (02/16 0700) Pulse Rate:  [90-116] 116 (02/16 0700) Resp:  [16-19] 17 (02/16 0700) BP: (97-117)/(69-84) 102/77 (02/16 0700) SpO2:  [94 %-96 %] 95 % (02/16 0700) Weight:  [79.5 kg] 79.5 kg (02/16 0505) Physical Exam: General: elderly woman, frail, NAD Cardiovascular: tachycardic with regular rate, no m/r/g Respiratory: NWOB on RA Abdomen: soft, NTND Extremities: no BLE appreciated   Laboratory: Most recent CBC Lab Results  Component Value Date   WBC 9.8 04/03/2023   HGB 11.9 (L) 04/03/2023   HCT 35.3 (L) 04/03/2023   MCV 86.5 04/03/2023   PLT 218 04/03/2023   Most recent BMP    Latest Ref Rng & Units 04/03/2023    3:14 AM  BMP  Glucose 70 - 99 mg/dL 147   BUN 8 - 23 mg/dL 35   Creatinine 8.29 - 1.00 mg/dL 5.62   Sodium 130 - 865 mmol/L 122   Potassium 3.5 - 5.1 mmol/L 4.3   Chloride 98 - 111 mmol/L 92   CO2 22 - 32 mmol/L 20   Calcium 8.9 - 10.3 mg/dL 7.8       Imaging/Diagnostic Tests: No new images  Georg Ruddle Marckus Hanover, MD 04/03/2023, 8:33 AM  PGY-1,  Family Medicine FPTS Intern pager: [phone], text pages welcome Secure chat group [facility] Teaching Service

## 2023-04-03 NOTE — Evaluation (Signed)
Occupational Therapy Evaluation Patient Details Name: Beatris Belen MRN: [id number] DOB: [birth year] Today's Date: 04/03/2023   History of Present Illness   Pt is an 89 y.o. female who presented 03/31/23 with SOB and cough. Pt admitted with new onset A-fib, CHF, pleural effusion, and PNA. PMH: HTN, HLD, chronic pain, scoliosis     Clinical Impressions PTA, pt lived at retirement community where she reports she was mod I for ADL, mobility, and medication management. Per pt, facility provides most meals. Pt with decreased strength and activity tolerance this session. Pt reports staff at her facility can assist her a bit more if needed when first returning home. Pt currently requires CGA for bed mobility and transfers. Pt reporting fatigued today so eval limited to LB ADL, seated grooming, and SPT transfer. Suspect pt to progress well and both son/pt agreeable to HHOT.      If plan is discharge home, recommend the following:   A little help with walking and/or transfers;A little help with bathing/dressing/bathroom;Assistance with cooking/housework;Assist for transportation;Help with stairs or ramp for entrance     Functional Status Assessment   Patient has had a recent decline in their functional status and demonstrates the ability to make significant improvements in function in a reasonable and predictable amount of time.     Equipment Recommendations   None recommended by OT     Recommendations for Other Services         Precautions/Restrictions   Precautions Precautions: Fall Restrictions Weight Bearing Restrictions Per Provider Order: No     Mobility Bed Mobility Overal bed mobility: Needs Assistance Bed Mobility: Supine to Sit     Supine to sit: Contact guard, HOB elevated, Used rails     General bed mobility comments: Extra time needed to transition supine to sit L EOB using rails with HOB elevated, CGA for safety    Transfers Overall transfer level: Needs  assistance Equipment used: Rolling walker (2 wheels) Transfers: Sit to/from Stand Sit to Stand: Contact guard assist           General transfer comment: VC for hand placement      Balance Overall balance assessment: Needs assistance Sitting-balance support: No upper extremity supported, Feet supported Sitting balance-Leahy Scale: Good Sitting balance - Comments: Able to sit EOB without LOB   Standing balance support: Bilateral upper extremity supported, Reliant on assistive device for balance, During functional activity Standing balance-Leahy Scale: Poor Standing balance comment: Reliant on RW                           ADL either performed or assessed with clinical judgement   ADL Overall ADL's : Needs assistance/impaired Eating/Feeding: Independent;Sitting   Grooming: Set up;Sitting   Upper Body Bathing: Set up;Sitting   Lower Body Bathing: Minimal assistance;Sit to/from stand   Upper Body Dressing : Set up;Sitting   Lower Body Dressing: Minimal assistance;Sit to/from stand               Functional mobility during ADLs: Contact guard assist;Rolling walker (2 wheels) General ADL Comments: Pt reports overall fatigued today     Vision Patient Visual Report: No change from baseline Vision Assessment?: No apparent visual deficits     Perception         Praxis         Pertinent Vitals/Pain Pain Assessment Pain Assessment: No/denies pain     Extremity/Trunk Assessment Upper Extremity Assessment Upper Extremity Assessment: Generalized weakness   Lower  Extremity Assessment Lower Extremity Assessment: Defer to PT evaluation   Cervical / Trunk Assessment Cervical / Trunk Assessment: Kyphotic (mild)   Communication Communication Communication: No apparent difficulties   Cognition Arousal: Alert Behavior During Therapy: WFL for tasks assessed/performed Cognition: No apparent impairments             OT - Cognition Comments: initially  slow to respond on arrival, but able to describe how she managees medications at home and is oriented                 Following commands: Intact       Cueing  General Comments   Cueing Techniques: Verbal cues      Exercises     Shoulder Instructions      Home Living Family/patient expects to be discharged to:: Other (Comment) (ALF)                                 Additional Comments: pt has RW, rollator, w/c, SPC, shower chair, 3in1, grab bars in shower and next to toilet      Prior Functioning/Environment Prior Level of Function : Independent/Modified Independent             Mobility Comments: Mod I, ambulates with a rollator ADLs Comments: Independent with bathing and dressing. Can do light cooking, like microwave use and coffee. Pt goes to dining room for meals. Staff does the cleaning. Was driving up until her recent hip injury. per pt she lives at [facility]    OT Problem List: Decreased strength;Impaired balance (sitting and/or standing);Decreased activity tolerance;Cardiopulmonary status limiting activity   OT Treatment/Interventions: Self-care/ADL training;Therapeutic exercise;DME and/or AE instruction;Balance training;Patient/family education;Therapeutic activities      OT Goals(Current goals can be found in the care plan section)   Acute Rehab OT Goals Patient Stated Goal: get better OT Goal Formulation: With patient/family Time For Goal Achievement: 04/17/23 Potential to Achieve Goals: Good   OT Frequency:  Min 1X/week    Co-evaluation              AM-PAC OT "6 Clicks" Daily Activity     Outcome Measure Help from another person eating meals?: None Help from another person taking care of personal grooming?: A Little Help from another person toileting, which includes using toliet, bedpan, or urinal?: A Little Help from another person bathing (including washing, rinsing, drying)?: A Little Help from another person to put  on and taking off regular upper body clothing?: A Little Help from another person to put on and taking off regular lower body clothing?: A Little 6 Click Score: 19   End of Session Equipment Utilized During Treatment: Gait belt;Rolling walker (2 wheels) Nurse Communication: Mobility status  Activity Tolerance: Patient tolerated treatment well Patient left: in chair;with call bell/phone within reach;with chair alarm set;with family/visitor present  OT Visit Diagnosis: Unsteadiness on feet (R26.81);Muscle weakness (generalized) (M62.81);Other (comment) (decreased activity toleranc)                Time: 1030-1052 OT Time Calculation (min): 22 min Charges:  OT General Charges $OT Visit: 1 Visit OT Evaluation $OT Eval Low Complexity: 1 Low  Tyler Deis, OTR/L [facility] Acute Rehabilitation Office: [phone]   Myrla Halsted 04/03/2023, 3:26 PM

## 2023-04-03 NOTE — Assessment & Plan Note (Signed)
Cr uptrending from yesterday. - UA, urine sodium and urine Cr  - AM BMP

## 2023-04-03 NOTE — Assessment & Plan Note (Addendum)
UOP ~1L yesterday. S/p 1 dose of Lasix 40mg  IV overnight. Due for AM dose.  - due for 1 more lasix IV 40 mg dose this AM, per cards recs will add 80 mg IV lasix for this AM, with PM dose ordered. Will adjust as needed  - daily BMP  - K> 4, Mag > 2

## 2023-04-04 DIAGNOSIS — I5041 Acute combined systolic (congestive) and diastolic (congestive) heart failure: Secondary | ICD-10-CM | POA: Diagnosis not present

## 2023-04-04 DIAGNOSIS — I4891 Unspecified atrial fibrillation: Secondary | ICD-10-CM | POA: Diagnosis not present

## 2023-04-04 LAB — BASIC METABOLIC PANEL
Anion gap: 12 (ref 5–15)
BUN: 31 mg/dL — ABNORMAL HIGH (ref 8–23)
CO2: 24 mmol/L (ref 22–32)
Calcium: 7.9 mg/dL — ABNORMAL LOW (ref 8.9–10.3)
Chloride: 88 mmol/L — ABNORMAL LOW (ref 98–111)
Creatinine, Ser: 1.1 mg/dL — ABNORMAL HIGH (ref 0.44–1.00)
GFR, Estimated: 48 mL/min — ABNORMAL LOW (ref >60)
Glucose, Bld: 107 mg/dL — ABNORMAL HIGH (ref 70–99)
Potassium: 3.9 mmol/L (ref 3.5–5.1)
Sodium: 124 mmol/L — ABNORMAL LOW (ref 135–145)

## 2023-04-04 LAB — CBC
HCT: 38.2 % (ref 36.0–46.0)
Hemoglobin: 12.9 g/dL (ref 12.0–15.0)
MCH: 28.9 pg (ref 26.0–34.0)
MCHC: 33.8 g/dL (ref 30.0–36.0)
MCV: 85.7 fL (ref 80.0–100.0)
Platelets: 208 10*3/uL (ref 150–400)
RBC: 4.46 MIL/uL (ref 3.87–5.11)
RDW: 14.3 % (ref 11.5–15.5)
WBC: 12.7 10*3/uL — ABNORMAL HIGH (ref 4.0–10.5)
nRBC: 0 % (ref 0.0–0.2)

## 2023-04-04 LAB — MAGNESIUM: Magnesium: 1.9 mg/dL (ref 1.7–2.4)

## 2023-04-04 MED ORDER — FUROSEMIDE 10 MG/ML IJ SOLN
80.0000 mg | Freq: Two times a day (BID) | INTRAMUSCULAR | Status: AC
  Administered 2023-04-04 (×2): 80 mg via INTRAVENOUS
  Filled 2023-04-04 (×2): qty 8

## 2023-04-04 MED ORDER — POTASSIUM CHLORIDE CRYS ER 10 MEQ PO TBCR
10.0000 meq | EXTENDED_RELEASE_TABLET | Freq: Once | ORAL | Status: AC
  Administered 2023-04-04: 10 meq via ORAL
  Filled 2023-04-04: qty 1

## 2023-04-04 MED ORDER — MAGNESIUM SULFATE 2 GM/50ML IV SOLN
2.0000 g | Freq: Once | INTRAVENOUS | Status: AC
  Administered 2023-04-04: 2 g via INTRAVENOUS
  Filled 2023-04-04: qty 50

## 2023-04-04 MED ORDER — GLYCERIN (LAXATIVE) 2 G RE SUPP
1.0000 | Freq: Once | RECTAL | Status: AC
  Administered 2023-04-04: 1 via RECTAL
  Filled 2023-04-04: qty 1

## 2023-04-04 NOTE — Assessment & Plan Note (Deleted)
Ct PE w/ moderate bilateral pleural effusions, diuresed yesterday, patient no complaints of shortness of breath and sating well on RA. Normal WOB on RA with no labored breathing, but crackles auscultated on exam. - Continue to monitor

## 2023-04-04 NOTE — Progress Notes (Signed)
Pharmacist Heart Failure Core Measure Documentation  Assessment: Gina Bishop has an EF documented as 20-25% on 04/01/2023 by ECHO.  Rationale: Heart failure patients with left ventricular systolic dysfunction (LVSD) and an EF < 40% should be prescribed an angiotensin converting enzyme inhibitor (ACEI) or angiotensin receptor blocker (ARB) at discharge unless a contraindication is documented in the medical record.  This patient is not currently on an ACEI or ARB for HF.  This note is being placed in the record in order to provide documentation that a contraindication to the use of these agents is present for this encounter.  ACE Inhibitor or Angiotensin Receptor Blocker is contraindicated (specify all that apply)  []   ACEI allergy AND ARB allergy []   Angioedema []   Moderate or severe aortic stenosis []   Hyperkalemia [x]   Hypotension []   Renal artery stenosis []   Worsening renal function, preexisting renal disease or dysfunction  Thank you for allowing pharmacy to participate in this patient's care,  Sherron Monday, PharmD, BCCCP Clinical Pharmacist  Phone: [phone] 04/04/2023 3:45 PM  Please check AMION for all [facility] Pharmacy phone numbers After 10:00 PM, call Main Pharmacy [phone]

## 2023-04-04 NOTE — Assessment & Plan Note (Addendum)
Cr improved to 1.10, CTM - AM BMP

## 2023-04-04 NOTE — Assessment & Plan Note (Addendum)
Better output with increased lasix. Down 1.2 L total -IV Lasix 80 mg BID - daily BMP  - K> 4, Mag > 2

## 2023-04-04 NOTE — Progress Notes (Addendum)
Rounding Note    Patient Name: Gina Bishop Date of Encounter: 04/04/2023  [facility] Cardiologist: Dr. Jacques Navy  Subjective   No acute overnight events. She diuresed well yesterday and denies any significant shortness of breath today. No chest pain or palpitations.   Inpatient Medications    Scheduled Meds:  apixaban  5 mg Oral BID   furosemide  80 mg Intravenous BID   gabapentin  100 mg Oral BID AC   metoprolol tartrate  25 mg Oral TID   polyethylene glycol  17 g Oral BID   pramipexole  0.5 mg Oral QHS   pravastatin  40 mg Oral QHS   senna  1 tablet Oral BID   Continuous Infusions:  PRN Meds: guaiFENesin-dextromethorphan   Vital Signs    Vitals:   04/03/23 2151 04/04/23 0507 04/04/23 0816 04/04/23 1210  BP: 97/67 103/80 98/72 114/83  Pulse: 82 93  (!) 104  Resp:  18  18  Temp:  (!) 97.4 F (36.3 C)  98.1 F (36.7 C)  TempSrc:  Oral  Oral  SpO2:  96%    Weight:  77.2 kg    Height:        Intake/Output Summary (Last 24 hours) at 04/04/2023 1236 Last data filed at 04/04/2023 8295 Gross per 24 hour  Intake --  Output 1200 ml  Net -1200 ml      04/04/2023    5:07 AM 04/03/2023    5:05 AM 04/02/2023    4:40 AM  Last 3 Weights  Weight (lbs) 170 lb 3.1 oz 175 lb 4.3 oz 176 lb 5.9 oz  Weight (kg) 77.2 kg 79.5 kg 80 kg      Telemetry    Atrial fibrillation with rates in the 90s to low 100s. - Personally Reviewed  ECG    No new ECG tracing today. - Personally Reviewed  Physical Exam   GEN: No acute distress.   Neck: JVD elevated. Cardiac: Borderline tachycardic with irregularly irregular rhythm. No murmurs, rubs, or gallops.  Respiratory: No increased work of breathing. Decreased breath sounds bilaterally. Mild crackles noted in bilateral bases.  GI: Soft,mildly distended, and non-tender.  MS: Trace lower extremity edema bilaterally. No deformity. Skin: Warm and dry. Neuro:  No focal deficits. Psych: Normal affect. Responds  appropriately.  Labs    High Sensitivity Troponin:   Recent Labs  Lab 03/31/23 1926 03/31/23 2347  TROPONINIHS 34* 36*     Chemistry Recent Labs  Lab 04/01/23 0500 04/01/23 1620 04/02/23 0432 04/02/23 1724 04/03/23 0314 04/04/23 0400  NA 121*   < > 122* 122* 122* 124*  K 4.8   < > 4.8 4.8 4.3 3.9  CL 91*   < > 92* 89* 92* 88*  CO2 18*   < > 21* 21* 20* 24  GLUCOSE 117*   < > 114* 135* 111* 107*  BUN 25*   < > 33* 33* 35* 31*  CREATININE 1.06*   < > 1.29* 1.30* 1.34* 1.10*  CALCIUM 8.2*   < > 8.0* 8.2* 7.8* 7.9*  MG 1.9   < > 2.1 2.0 1.9 1.9  PROT 5.1*  --  4.6*  --  4.8*  --   ALBUMIN 3.1*  --  2.7*  --  2.9*  --   AST 48*  --  38  --  33  --   ALT 145*  --  118*  --  108*  --   ALKPHOS 100  --  91  --  100  --   BILITOT 0.9  --  0.6  --  0.6  --   GFRNONAA 50*   < > 40* 39* 38* 48*  ANIONGAP 12   < > 9 12 10 12    < > = values in this interval not displayed.    Lipids  Recent Labs  Lab 04/01/23 0518  CHOL 112  TRIG 55  HDL 48  LDLCALC 53  CHOLHDL 2.3    Hematology Recent Labs  Lab 04/02/23 0432 04/03/23 0314 04/04/23 0400  WBC 9.7 9.8 12.7*  RBC 3.93 4.08 4.46  HGB 11.4* 11.9* 12.9  HCT 34.0* 35.3* 38.2  MCV 86.5 86.5 85.7  MCH 29.0 29.2 28.9  MCHC 33.5 33.7 33.8  RDW 14.3 14.3 14.3  PLT 197 218 208   Thyroid  Recent Labs  Lab 04/01/23 0530  TSH 1.233    BNP Recent Labs  Lab 03/31/23 1926  BNP 1,142.0*    DDimer No results for input(s): "DDIMER" in the last 168 hours.   Radiology    No results found.  Cardiac Studies   Echocardiogram 04/01/2023: Impressions: 1. Compared with the echo in 2022, systolic function is worse. Left  ventricular ejection fraction, by estimation, is 20 to 25%. The left  ventricle has severely decreased function. The left ventricle demonstrates  global hypokinesis. Left ventricular  diastolic parameters are consistent with Grade II diastolic dysfunction  (pseudonormalization).   2. Right ventricular  systolic function is normal. The right ventricular  size is normal. There is moderately elevated pulmonary artery systolic  pressure.   3. Large pleural effusion in the left lateral region.   4. The mitral valve is normal in structure. Mild mitral valve  regurgitation. No evidence of mitral stenosis.   5. Tricuspid valve regurgitation is moderate to severe.   6. The aortic valve is tricuspid. There is moderate calcification of the  aortic valve. There is moderate thickening of the aortic valve. Aortic  valve regurgitation is mild. No aortic stenosis is present.   7. The inferior vena cava is dilated in size with >50% respiratory  variability, suggesting right atrial pressure of 8 mmHg.   Patient Profile     89 y.o. female with a history of hypertension, hyperlipidemia, and scoliosis who presented on 03/31/2023 with weakness, shortness of breath, and a cough and was found to be in new onset atrial fibrillation with RR for which Cardiology was consulted.  Assessment & Plan    New Onset Atrial Fibrillation Diagnosed with new onset atrial fibrillation on admission in setting of recent URI with superimposed bacterial pneumonia. Rates initially in the 150s. Echo showed LVEF of 20-25% with global hypokinesis and severe RV dysfunction. She was initially started IV Diltiazem but this was stopped given reduced EF. - Rates reasonably well controlled in 90s to low 100s.  - Continue Lopressor 25mg  three times daily.  - CHA2D2-VASc = 6 (coronary artery calcifications noted on CT, CHF, HTN, age x2, female). She has been started on Eliquis 5mg  twice daily. Continue. - Will need TEE/ DCCV later this admission once more euvolemic.   Acute Combined CHF BNP markedly elevated at 1,142. Chest CTA showed cardiomegaly with elevated right heart pressure and at least some degree of right heart failure as well as moderate right and small left pleural effusion. Echo showed LVEF of 20-25% with global hypokinesis and  grade 2 diastolic dysfunction, severe RV dysfunction, moderately elevated PASP, and a large left pleural effusion. She was started on IV  Lasix with good urinary output. Documented urinary output of 2.25 L yesterday and net negative 1.174 L this admission. Weight down 6lbs since admission. Renal function stable. - Still volume overloaded on exam but improving. - Continue IV Lasix 80mg  twice daily. - Continue Lopressor 25mg  three times daily. Can transition to Toprol-XL prior to discharge. - Unable to add any other GDMT right now due to soft BP. - Continue to monitor daily weights, strict I/Os, and renal function.   Likely Moderate Low-Flow Low Gradient Aortic Stenosis Per Dr. Lupe Carney note on 04/03/2023: "Though gradients are low, stroke-volume index is low and DVI 0.38, might be lower given that the aortic valve signal was not measured at the highest value. Valve appears severely calcified and has moderate to severely reduced opening. " - Will need to re-evaluate this as an outpatient when patient is in sinus rhythm for true gradient.   Moderate to Severe Mitral Regurgitation MR read as mild on Echo report but Dr. Jacques Navy felt this was more moderate to severe. Likely secondary to severe left atrial enlargement and LV function. - Will continue to treat CHF as above and hope that this improves. - Can re-evaluate as an outpatient.  AKI Baseline creatinine around 0.8. Creatinine was 1.06 on admission and peaked at 1.30 on 04/02/2023.  - Creatinine stable at 1.10 today. - Continue to monitor closely with diuresis.  Hyponatremia Sodium as low as 121 on admission. Serum osmol and urine studies indicative of SIADH vs hypervolemic hyponatremia.  - Slowly improving with diuresis. 124 today.  - Continue diuresis as above. - Otherwise, per primary team.    For questions or updates, please contact [facility] Please consult [email or web address] for contact info under        Signed, Corrin Parker, PA-C  04/04/2023, 12:36 PM     Attending Note:   The patient was seen and examined.  Agree with assessment and plan as noted above.  Changes made to the above note as needed.  Patient seen and independently examined with Marjie Skiff, PA .   We discussed all aspects of the encounter. I agree with the assessment and plan as stated above.   Acute combined congestive heart failure/biventricular CHF: Priscella appears to be making progress.  Are limited by her hypotension.  She appears to have severe RV dysfunction.  She is diuresing quite well at present.  I do not think that we need to place a PICC line and consider pressors at this time.  2.  Low flow low gradient aortic stenosis: The aortic valve is moderately to severely calcified.  There is some concern for low-flow low gradient aortic stenosis.  Dr. Charlynne Pander will evaluate further as an outpatient.  2.  Atrial fibrillation: Her atrial fibrillation is fairly well controlled.  We will continue to diurese her and we will consider TEE cardioversion later in the week .     I have spent a total of 40 minutes with patient reviewing hospital  notes , telemetry, EKGs, labs and examining patient as well as establishing an assessment and plan that was discussed with the patient.  > 50% of time was spent in direct patient care.    Vesta Mixer, Montez Hageman., MD, [facility] 04/04/2023, 1:26 PM [address],  [address] Office [phone] Pager [phone]

## 2023-04-04 NOTE — Assessment & Plan Note (Deleted)
CTA notable with extensive multi-vessel coronary artery calcification.  Lipid panel with LDL 53.  - Pravastatin 40 mg daily

## 2023-04-04 NOTE — Progress Notes (Signed)
Mobility Specialist Progress Note;   04/04/23 1440  Mobility  Activity Ambulated with assistance in room  Level of Assistance Minimal assist, patient does 75% or more  Assistive Device Front wheel walker  Distance Ambulated (ft) 30 ft  Activity Response Tolerated fair  Mobility Referral Yes  Mobility visit 1 Mobility  Mobility Specialist Start Time (ACUTE ONLY) 1440  Mobility Specialist Stop Time (ACUTE ONLY) 1507  Mobility Specialist Time Calculation (min) (ACUTE ONLY) 27 min   Pt agreeable to mobility. Required MinG for bed mobility and MinA throughout ambulation for stability. Pt became incontinent upon standing requiring cleaning. Verbal cues required throughout for RW proximity, foot progression, and posture while ambulating. VSS throughout and no c/o. Pt returned back to bed with all needs met, alarm on. Son in room.   Caesar Bookman Mobility Specialist Please contact via SecureChat or [employer] [phone]

## 2023-04-04 NOTE — Progress Notes (Signed)
     Daily Progress Note Intern Pager: [phone]  Patient name: Gina Bishop Medical record number: [id number] Date of birth: August 23, 1931 Age: 89 y.o. Gender: female  Primary Care Provider: Caro Laroche, DO Consultants: neurology Code Status: DNR/DNI  Pt Overview and Major Events to Date:  2/13: admitted  Assessment and Plan:  Patient is an 89 year old female with PMH of HTN, hyperlipidemia, chronic pain who was admitted for shortness of breath and cough likely in the setting of new onset A-fib. Assessment & Plan Atrial fibrillation with RVR (HCC) Patient is status post IV Dilt drip and now on rate controlled on metoprolol.  - Cardiology consulted, appreciate recs -Continue metoprolol tartate to 25 mg TID for better rate control per cardiology - Eliquis 5 mg BID  - Cardiology to consider possible TEE cardioversion which will be further discussed with patient Hyponatremia Sodium continues to be stable at 124.  Given improvement with diuresis, likely related to volume overload. - Will continue to follow with daily BMP - Diurese with IV Lasix - Fluid restriction Congestive heart failure (CHF) (HCC) Better output with increased lasix. Down 1.2 L total -IV Lasix 80 mg BID - daily BMP  - K> 4, Mag > 2  AKI (acute kidney injury) (HCC) Cr improved to 1.10, CTM - AM BMP   Chronic and Stable Problems:  RLS: home pramipexole Hypertension: Holding home spironolactone and benazepril in setting of hyperkalemia on admission CAD: Continue pravastatin 40 mg daily  FEN/GI: Heart healthy PPx: Eliquis Dispo: Return to ALF  pending clinical improvement .   Subjective:  Patient reports she feels well today and that her breathing is better.  Denies any more chest pain or trouble breathing.  Objective: Temp:  [97.4 F (36.3 C)] 97.4 F (36.3 C) (02/17 0507) Pulse Rate:  [82-96] 93 (02/17 0507) Resp:  [16-18] 18 (02/17 0507) BP: (97-105)/(67-80) 103/80 (02/17 0507) SpO2:  [96 %] 96  % (02/17 0507) Weight:  [77.2 kg] 77.2 kg (02/17 0507) Physical Exam: General: Elderly appearing female, no distress Cardiovascular: RRR, no M/R/G Respiratory: Bilateral basilar crackles, good air movement, no increased work of breathing Abdomen: Flat, soft, nontender Extremities: 1+ pitting edema in the bilateral lower extremity  Laboratory: Most recent CBC Lab Results  Component Value Date   WBC 12.7 (H) 04/04/2023   HGB 12.9 04/04/2023   HCT 38.2 04/04/2023   MCV 85.7 04/04/2023   PLT 208 04/04/2023   Most recent BMP    Latest Ref Rng & Units 04/04/2023    4:00 AM  BMP  Glucose 70 - 99 mg/dL 254   BUN 8 - 23 mg/dL 31   Creatinine 2.70 - 1.00 mg/dL 6.23   Sodium 762 - 831 mmol/L 124   Potassium 3.5 - 5.1 mmol/L 3.9   Chloride 98 - 111 mmol/L 88   CO2 22 - 32 mmol/L 24   Calcium 8.9 - 10.3 mg/dL 7.9     Gerrit Heck, DO 04/04/2023, 8:01 AM  PGY-1, [facility] FPTS Intern pager: [phone], text pages welcome Secure chat group [facility] Teaching Service

## 2023-04-04 NOTE — Assessment & Plan Note (Deleted)
Pt presented with upper respiratory symptoms (cough, congestion), symptoms ongoing for several weeks.  CXR w/ left lower lobe opacitiy, suspicious for pnemonia.  WBC initially up-trended, but then down-trended. Has remained afebrile during the admission, no focal findings noted on exam, and procal negative.  - Bcx x2 NGTD, prelim result, continue following  - Discontinued Azithromycin and CTX

## 2023-04-04 NOTE — Assessment & Plan Note (Addendum)
Patient is status post IV Dilt drip and now on rate controlled on metoprolol.  - Cardiology consulted, appreciate recs -Continue metoprolol tartate to 25 mg TID for better rate control per cardiology - Eliquis 5 mg BID  - Cardiology to consider possible TEE cardioversion which will be further discussed with patient

## 2023-04-04 NOTE — Assessment & Plan Note (Addendum)
Sodium continues to be stable at 124.  Given improvement with diuresis, likely related to volume overload. - Will continue to follow with daily BMP - Diurese with IV Lasix - Fluid restriction

## 2023-04-04 NOTE — Assessment & Plan Note (Deleted)
Initially hypomagnesemic, but improved with repletion as indicated.  Continue to monitor.  - Mag daily  - Replete as indicated, goal > 2

## 2023-04-05 ENCOUNTER — Encounter (HOSPITAL_COMMUNITY): Payer: Self-pay | Admitting: Internal Medicine

## 2023-04-05 DIAGNOSIS — I5082 Biventricular heart failure: Secondary | ICD-10-CM | POA: Diagnosis not present

## 2023-04-05 DIAGNOSIS — I4891 Unspecified atrial fibrillation: Secondary | ICD-10-CM | POA: Diagnosis not present

## 2023-04-05 LAB — CULTURE, BLOOD (ROUTINE X 2)
Culture: NO GROWTH
Culture: NO GROWTH
Special Requests: ADEQUATE
Special Requests: ADEQUATE

## 2023-04-05 LAB — CBC
HCT: 41.5 % (ref 36.0–46.0)
Hemoglobin: 13.9 g/dL (ref 12.0–15.0)
MCH: 28.7 pg (ref 26.0–34.0)
MCHC: 33.5 g/dL (ref 30.0–36.0)
MCV: 85.7 fL (ref 80.0–100.0)
Platelets: 217 10*3/uL (ref 150–400)
RBC: 4.84 MIL/uL (ref 3.87–5.11)
RDW: 14.3 % (ref 11.5–15.5)
WBC: 14.8 10*3/uL — ABNORMAL HIGH (ref 4.0–10.5)
nRBC: 0 % (ref 0.0–0.2)

## 2023-04-05 LAB — BASIC METABOLIC PANEL
Anion gap: 13 (ref 5–15)
BUN: 35 mg/dL — ABNORMAL HIGH (ref 8–23)
CO2: 24 mmol/L (ref 22–32)
Calcium: 8 mg/dL — ABNORMAL LOW (ref 8.9–10.3)
Chloride: 86 mmol/L — ABNORMAL LOW (ref 98–111)
Creatinine, Ser: 1.38 mg/dL — ABNORMAL HIGH (ref 0.44–1.00)
GFR, Estimated: 37 mL/min — ABNORMAL LOW (ref >60)
Glucose, Bld: 188 mg/dL — ABNORMAL HIGH (ref 70–99)
Potassium: 4.3 mmol/L (ref 3.5–5.1)
Sodium: 123 mmol/L — ABNORMAL LOW (ref 135–145)

## 2023-04-05 LAB — MAGNESIUM: Magnesium: 2.2 mg/dL (ref 1.7–2.4)

## 2023-04-05 MED ORDER — FLEET ENEMA RE ENEM
1.0000 | ENEMA | Freq: Once | RECTAL | Status: AC
  Administered 2023-04-05: 1 via RECTAL
  Filled 2023-04-05: qty 1

## 2023-04-05 MED ORDER — ACETAMINOPHEN 325 MG PO TABS
650.0000 mg | ORAL_TABLET | Freq: Four times a day (QID) | ORAL | Status: DC | PRN
  Administered 2023-04-05: 650 mg via ORAL
  Filled 2023-04-05: qty 2

## 2023-04-05 MED ORDER — FUROSEMIDE 10 MG/ML IJ SOLN
80.0000 mg | Freq: Two times a day (BID) | INTRAMUSCULAR | Status: DC
  Administered 2023-04-05 – 2023-04-06 (×2): 80 mg via INTRAVENOUS
  Filled 2023-04-05 (×2): qty 8

## 2023-04-05 NOTE — Assessment & Plan Note (Signed)
Stable output with IV Lasix, 1.4 L total. -IV Lasix 80 mg BID - daily BMP  - K> 4, Mag > 2

## 2023-04-05 NOTE — NC FL2 (Signed)
[place] MEDICAID FL2 LEVEL OF CARE FORM     IDENTIFICATION  Patient Name: Gina Bishop Birthdate: 04-26-1931 Sex: female Admission Date (Current Location): 03/31/2023  [facility] and [place] Number:  Producer, television/film/video and Address:  [facility], [address], [place], [place] [address]      Provider Number: [id number]  Attending Physician Name and Address:  Caro Laroche, DO  Relative Name and Phone Number:  Minerva Areola [phone]) [phone]    Current Level of Care: Hospital Recommended Level of Care: Skilled Nursing Facility Prior Approval Number:    Date Approved/Denied:   PASRR Number: [id number]  Discharge Plan: SNF    Current Diagnoses: Patient Active Problem List   Diagnosis Date Noted   AKI (acute kidney injury) (HCC) 04/03/2023   CAD (coronary artery disease) 04/01/2023   Atrial fibrillation with RVR (HCC) 03/31/2023   Congestive heart failure (CHF) (HCC) 03/31/2023   ABLA (acute blood loss anemia) 05/13/2022   Thrombocytopenia (HCC) 05/13/2022   Closed left hip fracture, initial encounter (HCC) 05/11/2022   Anxiety 05/11/2022   Cough 06/04/2021   Leg skin lesion, right 02/20/2021   Aortic valve sclerosis 07/04/2020   Insomnia 01/24/2020   IBS (irritable bowel syndrome) 10/30/2019   Incontinence in female 10/11/2018   Frequent falls 07/28/2018   Adjustment reaction 07/28/2018   Vertigo, labyrinthine 05/15/2015   Cardiac arrhythmia 05/08/2015   Low back pain radiating to left leg 05/08/2015   Hyponatremia 05/08/2015   Solitary pulmonary nodule 05/08/2015   Vitamin D deficiency 11/17/2012   Degenerative joint disease of cervical spine 10/20/2011   Hypertension 05/13/2010   Hypercholesteremia 05/13/2010   Restless leg syndrome 05/13/2010   Osteoporosis 05/13/2010    Orientation RESPIRATION BLADDER Height & Weight     Self, Time, Situation, Place  Normal Incontinent, External catheter (External Urinary Catheter) Weight: 165  lb 12.6 oz (75.2 kg) Height:  5\' 5"  (165.1 cm)  BEHAVIORAL SYMPTOMS/MOOD NEUROLOGICAL BOWEL NUTRITION STATUS      Continent Diet (Please see discharge summary)  AMBULATORY STATUS COMMUNICATION OF NEEDS Skin   Limited Assist Verbally Other (Comment) (Wound/Incision LDAs,PI mid deep tissue,PI sacrum,medial)                       Personal Care Assistance Level of Assistance  Bathing, Feeding, Dressing Bathing Assistance: Limited assistance Feeding assistance: Independent Dressing Assistance: Limited assistance     Functional Limitations Info  Sight, Hearing, Speech Sight Info:  (Reading glasses) Hearing Info: Adequate Speech Info: Adequate    SPECIAL CARE FACTORS FREQUENCY  PT (By licensed PT), OT (By licensed OT)     PT Frequency: 5x min weekly OT Frequency: 5x min weekly            Contractures Contractures Info: Not present    Additional Factors Info  Code Status, Allergies Code Status Info: DNR Allergies Info: Fish Allergy,Orange Juice (orange Oil),Strawberry Extract,Vicodin (hydrocodone-acetaminophen),Erythromycin,Amlodipine,Fexofenadine,Amoxicillin,Hydrochlorothiazide           Current Medications (04/05/2023):  This is the current hospital active medication list Current Facility-Administered Medications  Medication Dose Route Frequency Provider Last Rate Last Admin   acetaminophen (TYLENOL) tablet 650 mg  650 mg Oral Q6H PRN Gerrit Heck, DO   650 mg at 04/05/23 1029   apixaban (ELIQUIS) tablet 5 mg  5 mg Oral BID Vonna Drafts, MD   5 mg at 04/05/23 0828   furosemide (LASIX) injection 80 mg  80 mg Intravenous BID Elberta Fortis, MD  gabapentin (NEURONTIN) capsule 100 mg  100 mg Oral BID AC Elberta Fortis, MD   100 mg at 04/05/23 1542   guaiFENesin-dextromethorphan (ROBITUSSIN DM) 100-10 MG/5ML syrup 5 mL  5 mL Oral Q4H PRN Caro Laroche, DO   5 mL at 04/01/23 2330   metoprolol tartrate (LOPRESSOR) tablet 25 mg  25 mg Oral TID Parke Poisson, MD   25 mg at 04/05/23 1542   polyethylene glycol (MIRALAX / GLYCOLAX) packet 17 g  17 g Oral BID Baloch, Mahnoor, MD   17 g at 04/05/23 1610   pramipexole (MIRAPEX) tablet 0.5 mg  0.5 mg Oral QHS Elberta Fortis, MD   0.5 mg at 04/04/23 2210   pravastatin (PRAVACHOL) tablet 40 mg  40 mg Oral QHS Elberta Fortis, MD   40 mg at 04/04/23 2202   senna (SENOKOT) tablet 8.6 mg  1 tablet Oral BID Georg Ruddle, Mahnoor, MD   8.6 mg at 04/05/23 9604     Discharge Medications: Please see discharge summary for a list of discharge medications.  Relevant Imaging Results:  Relevant Lab Results:   Additional Information [id number]  Delilah Shan, LCSWA

## 2023-04-05 NOTE — TOC Initial Note (Addendum)
Transition of Care [facility]) - Initial/Assessment Note    Patient Details  Name: Gina Bishop MRN: [id number] Date of Birth: [birth year]  Transition of Care [facility]) CM/SW Contact:    Delilah Shan, LCSWA Phone Number: 04/05/2023, 3:29 PM  Clinical Narrative:                  CSW received consult for possible SNF placement at time of discharge. CSW spoke with patient at bedside regarding PT recommendation of SNF placement at time of discharge. Patient expressed understanding of PT recommendation and is agreeable to SNF placement at time of discharge.  Patient request for CSW to follow up with her son Minerva Areola in regards to her dc plan. CSW spoke with patients son Minerva Areola regarding PT recommendations for patient.Minerva Areola reports patient comes from [facility]. Minerva Areola is also in agreement with SNF placement at time of discharge.CSW discussed insurance authorization process . No further questions reported at this time. CSW to continue to follow and assist with discharge planning needs.   Expected Discharge Plan: Skilled Nursing Facility Barriers to Discharge: Continued Medical Work up   Patient Goals and CMS Choice Patient states their goals for this hospitalization and ongoing recovery are:: SNF   Choice offered to / list presented to : Patient, Adult Children (patient and son Minerva Areola)      Expected Discharge Plan and Services In-house Referral: Clinical Social Work     Living arrangements for the past 2 months: Assisted Living Facility                                      Prior Living Arrangements/Services Living arrangements for the past 2 months: Assisted Living Facility Lives with:: Facility Resident Patient language and need for interpreter reviewed:: Yes Do you feel safe going back to the place where you live?: No   SNF  Need for Family Participation in Patient Care: Yes (Comment) Care giver support system in place?: Yes (comment)   Criminal Activity/Legal Involvement Pertinent to  Current Situation/Hospitalization: No - Comment as needed  Activities of Daily Living   ADL Screening (condition at time of admission) Independently performs ADLs?: No Does the patient have a NEW difficulty with bathing/dressing/toileting/self-feeding that is expected to last >3 days?: No (needs assist) Does the patient have a NEW difficulty with getting in/out of bed, walking, or climbing stairs that is expected to last >3 days?: No (needs assist up with walker) Does the patient have a NEW difficulty with communication that is expected to last >3 days?: No Is the patient deaf or have difficulty hearing?: No Does the patient have difficulty seeing, even when wearing glasses/contacts?: No Does the patient have difficulty concentrating, remembering, or making decisions?: Yes  Permission Sought/Granted Permission sought to share information with : Case Manager, Magazine features editor, Family Supports Permission granted to share information with : Yes, Verbal Permission Granted  Share Information with NAME: Minerva Areola  Permission granted to share info w AGENCY: SNF  Permission granted to share info w Relationship: son  Permission granted to share info w Contact Information: Minerva Areola [phone]  Emotional Assessment Appearance:: Appears stated age Attitude/Demeanor/Rapport: Gracious Affect (typically observed): Calm Orientation: : Oriented to Self, Oriented to Place, Oriented to  Time, Oriented to Situation Alcohol / Substance Use: Not Applicable Psych Involvement: No (comment)  Admission diagnosis:  Atrial fibrillation with RVR (HCC) [I48.91] Acute on chronic heart failure, unspecified heart failure type (HCC) [  I50.9] Community acquired pneumonia, unspecified laterality [J18.9] Patient Active Problem List   Diagnosis Date Noted   AKI (acute kidney injury) (HCC) 04/03/2023   CAD (coronary artery disease) 04/01/2023   Atrial fibrillation with RVR (HCC) 03/31/2023   Congestive heart  failure (CHF) (HCC) 03/31/2023   ABLA (acute blood loss anemia) 05/13/2022   Thrombocytopenia (HCC) 05/13/2022   Closed left hip fracture, initial encounter (HCC) 05/11/2022   Anxiety 05/11/2022   Cough 06/04/2021   Leg skin lesion, right 02/20/2021   Aortic valve sclerosis 07/04/2020   Insomnia 01/24/2020   IBS (irritable bowel syndrome) 10/30/2019   Incontinence in female 10/11/2018   Frequent falls 07/28/2018   Adjustment reaction 07/28/2018   Vertigo, labyrinthine 05/15/2015   Cardiac arrhythmia 05/08/2015   Low back pain radiating to left leg 05/08/2015   Hyponatremia 05/08/2015   Solitary pulmonary nodule 05/08/2015   Vitamin D deficiency 11/17/2012   Degenerative joint disease of cervical spine 10/20/2011   Hypertension 05/13/2010   Hypercholesteremia 05/13/2010   Restless leg syndrome 05/13/2010   Osteoporosis 05/13/2010   PCP:  Caro Laroche, DO Pharmacy:   [id number] [place], [place] - [address] AT [facility] [address] [place] [address] Phone: [phone] Fax: [phone]  [id number] - [place], [place] - [address] AT [facility] [address] [place] [address] Phone: [phone] Fax: [phone]     Social Drivers of Health (SDOH) Social History: SDOH Screenings   Food Insecurity: No Food Insecurity (04/01/2023)  Housing: Low Risk  (04/01/2023)  Transportation Needs: No Transportation Needs (04/01/2023)  Utilities: Not At Risk (04/01/2023)  Alcohol Screen: Low Risk  (04/27/2022)  Depression (PHQ2-9): Low Risk  (04/27/2022)  Financial Resource Strain: Low Risk  (04/27/2022)  Physical Activity: Sufficiently Active (04/27/2022)  Social Connections: Moderately Isolated (04/01/2023)  Stress: No Stress Concern Present (04/27/2022)  Tobacco Use: Low Risk  (03/31/2023)   SDOH Interventions:     Readmission Risk Interventions     No data  to display

## 2023-04-05 NOTE — Plan of Care (Signed)

## 2023-04-05 NOTE — Progress Notes (Addendum)
Rounding Note    Patient Name: Gina Bishop Date of Encounter: 04/05/2023  [facility] Cardiologist: Parke Poisson, MD   Subjective   No acute overnight events. She denies any shortness of breath this morning. No chest pain or palpitations. Weight is down 5lbs from yesterday. There is only 200cc of documented urinary output from yesterday but it sounds there was an issue with external catheter yesterday and not everything was documented.  She is still in atrial fibrillation but rates better controlled in the 70s to 90s.  Inpatient Medications    Scheduled Meds:  apixaban  5 mg Oral BID   gabapentin  100 mg Oral BID AC   metoprolol tartrate  25 mg Oral TID   polyethylene glycol  17 g Oral BID   pramipexole  0.5 mg Oral QHS   pravastatin  40 mg Oral QHS   senna  1 tablet Oral BID   Continuous Infusions:  PRN Meds: guaiFENesin-dextromethorphan   Vital Signs    Vitals:   04/04/23 1210 04/04/23 1714 04/04/23 1951 04/05/23 0356  BP: 114/83 111/85 104/65 100/70  Pulse: (!) 104 (!) 104 100 73  Resp: 18  18 16   Temp: 98.1 F (36.7 C)  97.8 F (36.6 C) 97.6 F (36.4 C)  TempSrc: Oral  Oral Oral  SpO2:   96% 96%  Weight:    75.2 kg  Height:        Intake/Output Summary (Last 24 hours) at 04/05/2023 0928 Last data filed at 04/05/2023 0357 Gross per 24 hour  Intake --  Output 250 ml  Net -250 ml      04/05/2023    3:56 AM 04/04/2023    5:07 AM 04/03/2023    5:05 AM  Last 3 Weights  Weight (lbs) 165 lb 12.6 oz 170 lb 3.1 oz 175 lb 4.3 oz  Weight (kg) 75.2 kg 77.2 kg 79.5 kg      Telemetry    Atrial fibrillation with rates in the 70s to 90s. - Personally Reviewed  ECG    No new ECG tracing today. - Personally Reviewed  Physical Exam   GEN: No acute distress.   Neck: JVD slightly elevated but improved from yesterday. Cardiac: Irregularly irregular rhythm with normal rate. No murmurs, rubs, or gallops.  Respiratory: No increased work of  breathing. Decreased breath sounds in bilateral bases with faint bibasilar crackles.  MS: Trace lower extremity edema bilaterally. No deformity. Neuro:  No focal deficits. Psych: Normal affect. Responds appropriately.  Labs    High Sensitivity Troponin:   Recent Labs  Lab 03/31/23 1926 03/31/23 2347  TROPONINIHS 34* 36*     Chemistry Recent Labs  Lab 04/01/23 0500 04/01/23 1620 04/02/23 0432 04/02/23 1724 04/03/23 0314 04/04/23 0400 04/05/23 0816  NA 121*   < > 122*   < > 122* 124* 123*  K 4.8   < > 4.8   < > 4.3 3.9 4.3  CL 91*   < > 92*   < > 92* 88* 86*  CO2 18*   < > 21*   < > 20* 24 24  GLUCOSE 117*   < > 114*   < > 111* 107* 188*  BUN 25*   < > 33*   < > 35* 31* 35*  CREATININE 1.06*   < > 1.29*   < > 1.34* 1.10* 1.38*  CALCIUM 8.2*   < > 8.0*   < > 7.8* 7.9* 8.0*  MG 1.9   < >  2.1   < > 1.9 1.9 2.2  PROT 5.1*  --  4.6*  --  4.8*  --   --   ALBUMIN 3.1*  --  2.7*  --  2.9*  --   --   AST 48*  --  38  --  33  --   --   ALT 145*  --  118*  --  108*  --   --   ALKPHOS 100  --  91  --  100  --   --   BILITOT 0.9  --  0.6  --  0.6  --   --   GFRNONAA 50*   < > 40*   < > 38* 48* 37*  ANIONGAP 12   < > 9   < > 10 12 13    < > = values in this interval not displayed.    Lipids  Recent Labs  Lab 04/01/23 0518  CHOL 112  TRIG 55  HDL 48  LDLCALC 53  CHOLHDL 2.3    Hematology Recent Labs  Lab 04/03/23 0314 04/04/23 0400 04/05/23 0816  WBC 9.8 12.7* 14.8*  RBC 4.08 4.46 4.84  HGB 11.9* 12.9 13.9  HCT 35.3* 38.2 41.5  MCV 86.5 85.7 85.7  MCH 29.2 28.9 28.7  MCHC 33.7 33.8 33.5  RDW 14.3 14.3 14.3  PLT 218 208 217   Thyroid  Recent Labs  Lab 04/01/23 0530  TSH 1.233    BNP Recent Labs  Lab 03/31/23 1926  BNP 1,142.0*    DDimer No results for input(s): "DDIMER" in the last 168 hours.   Radiology    No results found.  Cardiac Studies   Echocardiogram 04/01/2023: Impressions: 1. Compared with the echo in 2022, systolic function is worse.  Left  ventricular ejection fraction, by estimation, is 20 to 25%. The left  ventricle has severely decreased function. The left ventricle demonstrates  global hypokinesis. Left ventricular  diastolic parameters are consistent with Grade II diastolic dysfunction  (pseudonormalization).   2. Right ventricular systolic function is normal. The right ventricular  size is normal. There is moderately elevated pulmonary artery systolic  pressure.   3. Large pleural effusion in the left lateral region.   4. The mitral valve is normal in structure. Mild mitral valve  regurgitation. No evidence of mitral stenosis.   5. Tricuspid valve regurgitation is moderate to severe.   6. The aortic valve is tricuspid. There is moderate calcification of the  aortic valve. There is moderate thickening of the aortic valve. Aortic  valve regurgitation is mild. No aortic stenosis is present.   7. The inferior vena cava is dilated in size with >50% respiratory  variability, suggesting right atrial pressure of 8 mmHg.   Patient Profile     89 y.o. female  with a history of hypertension, hyperlipidemia, and scoliosis who presented on 03/31/2023 with weakness, shortness of breath, and a cough and was found to be in new onset atrial fibrillation with RR for which Cardiology was consulted.   Assessment & Plan    New Onset Atrial Fibrillation Diagnosed with new onset atrial fibrillation on admission in setting of recent URI with superimposed bacterial pneumonia. Rates initially in the 150s. Echo showed LVEF of 20-25% with global hypokinesis and severe RV dysfunction. She was initially started IV Diltiazem but this was stopped given reduced EF. - Rates well controlled in the 70s to 90s. - Continue Lopressor 25mg  three times daily.  - CHA2D2-VASc = 6 (coronary  artery calcifications noted on CT, CHF, HTN, age x2, female). She has been started on Eliquis 5mg  twice daily. Continue. - She would benefit from restoration of sinus  rhythm. Discussed TEE/ DCCV this admission but patient would like to avoid any invasive procedures. Could consider Amiodarone. Will discus with MD.   Acute Combined CHF Biventricular Failure BNP markedly elevated at 1,142. Chest CTA showed cardiomegaly with elevated right heart pressure and at least some degree of right heart failure as well as moderate right and small left pleural effusion. Echo showed LVEF of 20-25% with global hypokinesis and grade 2 diastolic dysfunction, severe RV dysfunction, moderately elevated PASP, and a large left pleural effusion. She was started on IV Lasix. Only 200 cc of documented urinary output yesterday but suspect this is not accurate. Net negative  and net negative 1.4 L this admission. Weight down 5 lbs from yesterday and 11 lbs from admission. Creatinine up slightly today. - Still volume overloaded on exam but improving.  - Currently on IV Lasix 80mg  twice daily. Continue for now. Will discuss whether any adjustments need to be made with MD. - Continue Lopressor 25mg  three times daily. Can transition to Toprol-XL prior to discharge. - Unable to add any other GDMT right now due to soft BP. - Continue to monitor daily weights, strict I/Os, and renal function.    Likely Moderate Low-Flow Low Gradient Aortic Stenosis Per Dr. Lupe Carney note on 04/03/2023: "Though gradients are low, stroke-volume index is low and DVI 0.38, might be lower given that the aortic valve signal was not measured at the highest value. Valve appears severely calcified and has moderate to severely reduced opening. " - Will need to re-evaluate this as an outpatient when patient is in sinus rhythm for true gradient.    Moderate to Severe Mitral Regurgitation MR read as mild on Echo report but Dr. Jacques Navy felt this was more moderate to severe. Likely secondary to severe left atrial enlargement and LV function. - Will continue to treat CHF as above and hope that this improves. - Can re-evaluate as  an outpatient.   AKI Baseline creatinine around 0.8. Creatinine was 1.06 on admission and peaked at 1.30 on 04/02/2023.  - Creatinine upon from 1.10 yesterday to 1.38 today.   Hyponatremia Sodium as low as 121 on admission. Serum osmol and urine studies indicative of SIADH vs hypervolemic hyponatremia.  - Slowly improving with diuresis. 123 today. - Continue diuresis as above. - Otherwise, per primary team.  For questions or updates, please contact [facility] Please consult [email or web address] for contact info under        Signed, Corrin Parker, PA-C  04/05/2023, 9:28 AM    Attending Note:   The patient was seen and examined.  Agree with assessment and plan as noted above.  Changes made to the above note as needed.  Patient seen and independently examined with Marjie Skiff, PA .   We discussed all aspects of the encounter. I agree with the assessment and plan as stated above.     Biventricular CHF.  Pt has significant LV and RV failure .  Her prognosis is poor  She does not want to have a TEE / CV  Not much diuresis today  Says that she is tired of fighting all this   We discuss the possibility of a pallative care consult and she agreed with that.   Continue current meds for now      I have spent a total of 40  minutes with patient reviewing hospital  notes , telemetry, EKGs, labs and examining patient as well as establishing an assessment and plan that was discussed with the patient.  > 50% of time was spent in direct patient care.    Vesta Mixer, Montez Hageman., MD, [facility] 04/05/2023, 11:26 AM [address]  [address] Office [phone] Pager [phone]

## 2023-04-05 NOTE — Assessment & Plan Note (Addendum)
Cr 1.38, CTM - AM BMP

## 2023-04-05 NOTE — Plan of Care (Signed)

## 2023-04-05 NOTE — Progress Notes (Signed)
Physical Therapy Treatment Patient Details Name: Gina Bishop MRN: [id number] DOB: Mar 06, 1931 Today's Date: 04/05/2023   History of Present Illness 89 y.o. female admitted 03/31/23 with SOB, cough, new onset A-fib, CHF, pleural effusion, and PNA. PMH: HTN, HLD, chronic pain, scoliosis    PT Comments  Pt initially stating she is tired and unsure about mobility. Discussed with pt cardiology reference for palliative with pt and son present and she states "yeah maybe". Pt somewhat undecided over how much she wants to pursue for activity and care but ultimately agreed to mobility but stated she did not think she could manage with return to ILF. Pt educated for transfers, gait and STS repeated trials with pt agreeable to post acute rehab. Will continue to follow. Patient will benefit from continued inpatient follow up therapy, <3 hours/day     If plan is discharge home, recommend the following: Assistance with cooking/housework;Assist for transportation   Can travel by private vehicle     Yes  Equipment Recommendations  None recommended by PT    Recommendations for Other Services       Precautions / Restrictions Precautions Precautions: Fall;Other (comment) Recall of Precautions/Restrictions: Intact Precaution/Restrictions Comments: urinary incontinence     Mobility  Bed Mobility Overal bed mobility: Needs Assistance Bed Mobility: Supine to Sit, Sit to Supine     Supine to sit: Min assist Sit to supine: Min assist   General bed mobility comments: min assist to fully elevate trunk from surface and assist to lift legs to return to bed    Transfers Overall transfer level: Needs assistance   Transfers: Sit to/from Stand Sit to Stand: Contact guard assist, Min assist           General transfer comment: initial rise from bed min assist. pt performed 5 repeated STS with CGA from EOB then pivoted to [facility] for BM with min assist to direct tranfers without use of AD. pt stood 3  additional times from [facility] without physical assist then pivoted back to bed. Assist for pericare with urinary incontinence    Ambulation/Gait Ambulation/Gait assistance: Contact guard assist Gait Distance (Feet): 20 Feet Assistive device: Rolling walker (2 wheels) Gait Pattern/deviations: Step-through pattern, Decreased stride length, Trunk flexed   Gait velocity interpretation: <1.8 ft/sec, indicate of risk for recurrent falls   General Gait Details: cues for direction, use of RW and limited by fatigue, CGA for safety   Stairs             Wheelchair Mobility     Tilt Bed    Modified Rankin (Stroke Patients Only)       Balance Overall balance assessment: Needs assistance Sitting-balance support: No upper extremity supported, Feet supported Sitting balance-Leahy Scale: Good Sitting balance - Comments: EOB and BSC without support   Standing balance support: Bilateral upper extremity supported, Reliant on assistive device for balance, During functional activity Standing balance-Leahy Scale: Fair Standing balance comment: can static stand briefly without support, benefits from single to bil UE support in standing                            Communication Communication Communication: No apparent difficulties  Cognition Arousal: Alert Behavior During Therapy: WFL for tasks assessed/performed   PT - Cognitive impairments: No apparent impairments                         Following commands: Intact  Cueing Cueing Techniques: Verbal cues  Exercises      General Comments        Pertinent Vitals/Pain Pain Assessment Pain Assessment: 0-10 Pain Score: 3  Pain Location: sacrum Pain Descriptors / Indicators: Sore Pain Intervention(s): Limited activity within patient's tolerance, Monitored during session    Home Living                          Prior Function            PT Goals (current goals can now be found in the care  plan section) Progress towards PT goals: Progressing toward goals    Frequency    Min 1X/week      PT Plan      Co-evaluation              AM-PAC PT "6 Clicks" Mobility   Outcome Measure  Help needed turning from your back to your side while in a flat bed without using bedrails?: A Little Help needed moving from lying on your back to sitting on the side of a flat bed without using bedrails?: A Little Help needed moving to and from a bed to a chair (including a wheelchair)?: A Little Help needed standing up from a chair using your arms (e.g., wheelchair or bedside chair)?: A Little Help needed to walk in hospital room?: A Little Help needed climbing 3-5 steps with a railing? : Total 6 Click Score: 16    End of Session   Activity Tolerance: Patient tolerated treatment well Patient left: in bed;with call bell/phone within reach;with bed alarm set Nurse Communication: Mobility status PT Visit Diagnosis: Other abnormalities of gait and mobility (R26.89);Muscle weakness (generalized) (M62.81);Difficulty in walking, not elsewhere classified (R26.2)     Time: 1610-9604 PT Time Calculation (min) (ACUTE ONLY): 39 min  Charges:    $Gait Training: 8-22 mins $Therapeutic Activity: 23-37 mins PT General Charges $$ ACUTE PT VISIT: 1 Visit                     Merryl Hacker, PT Acute Rehabilitation Services Office: [phone]    Enedina Finner Mars Scheaffer 04/05/2023, 1:28 PM

## 2023-04-05 NOTE — Assessment & Plan Note (Signed)
Sodium continues to be stable at 124.  Given improvement with diuresis, likely related to volume overload. - Will continue to follow with daily BMP - Diurese with IV Lasix - Fluid restriction

## 2023-04-05 NOTE — Assessment & Plan Note (Addendum)
Heart rate briefly broke 100 yesterday, will continue to monitor and follow cardiology recommendations.  Reviewed telemetry and patient is consistently in atrial fibrillation over at least the last 12 hours. Cardiology feels there is not much more to do for her given her significant R and L ventricular dysfunction. - Cardiology consulted, appreciate recs -broach GOC conversation with patient and family -Continue metoprolol tartate to 25 mg TID for better rate control per cardiology - Eliquis 5 mg BID  - Cardiology to consider possible TEE cardioversion which will be further discussed with patient

## 2023-04-05 NOTE — Progress Notes (Signed)
Mobility Specialist Progress Note;   04/05/23 0916  Mobility  Activity Transferred from bed to chair  Level of Assistance Minimal assist, patient does 75% or more  Assistive Device Front wheel walker  Distance Ambulated (ft) 3 ft  Activity Response Tolerated well  Mobility Referral Yes  Mobility visit 1 Mobility  Mobility Specialist Start Time (ACUTE ONLY) [id number]  Mobility Specialist Stop Time (ACUTE ONLY) 0933  Mobility Specialist Time Calculation (min) (ACUTE ONLY) 17 min   Pt agreeable to mobility. Required MinA for all mobility to safely transfer pt from bed to chair. VSS throughout and no c/o when asked. Pt left in chair with all needs met, alarm on.   Caesar Bookman Mobility Specialist Please contact via SecureChat or [employer] [phone]

## 2023-04-05 NOTE — Progress Notes (Signed)
Daily Progress Note Intern Pager: [phone]  Patient name: Gina Bishop Medical record number: [id number] Date of birth: 1931-10-20 Age: 89 y.o. Gender: female  Primary Care Provider: Caro Laroche, DO Consultants: Cardiology Code Status: DNR  Pt Overview and Major Events to Date:  2/13: Admitted  Assessment and Plan:  This patient is an 89 year old female with a past medical history of HTN, HLD, chronic pain who is admitted for shortness of breath and cough likely in the setting of new onset A-fib.  She is now rate controlled. Assessment & Plan Atrial fibrillation with RVR (HCC) Heart rate briefly broke 100 yesterday, will continue to monitor and follow cardiology recommendations.  Reviewed telemetry and patient is consistently in atrial fibrillation over at least the last 12 hours. Cardiology feels there is not much more to do for her given her significant R and L ventricular dysfunction. - Cardiology consulted, appreciate recs -broach GOC conversation with patient and family -Continue metoprolol tartate to 25 mg TID for better rate control per cardiology - Eliquis 5 mg BID  - Cardiology to consider possible TEE cardioversion which will be further discussed with patient Hyponatremia Sodium continues to be stable at 124.  Given improvement with diuresis, likely related to volume overload. - Will continue to follow with daily BMP - Diurese with IV Lasix - Fluid restriction Congestive heart failure (CHF) (HCC) Stable output with IV Lasix, 1.4 L total. -IV Lasix 80 mg BID - daily BMP  - K> 4, Mag > 2  AKI (acute kidney injury) (HCC) Cr 1.38, CTM - AM BMP   Chronic and Stable Problems:  RLS: Home pramipexole HTN: Holding home spironolactone and benazepril in setting of hyperkalemia on admission, consider restarting at discharge CAD: Continue pravastatin 40 mg daily  FEN/GI: Heart healthy PPx: Eliquis Dispo: Return to ALF  pending clinical improvement .    Subjective:  Patient feeling better today, reports that her breathing has improved she is feeling much lighter as more fluid is pulled off of her.  Objective: Temp:  [97.6 F (36.4 C)-98.1 F (36.7 C)] 97.6 F (36.4 C) (02/18 0356) Pulse Rate:  [73-104] 73 (02/18 0356) Resp:  [16-18] 16 (02/18 0356) BP: (98-114)/(65-85) 100/70 (02/18 0356) SpO2:  [96 %] 96 % (02/18 0356) Weight:  [75.2 kg] 75.2 kg (02/18 0356) Physical Exam: General: Elderly appearing female, no distress Cardiovascular: Atrial fibrillation, no M/R/G Respiratory: Bibasilar crackles present, less robust than previous exams.  No increased work of breathing Abdomen: Flat, soft, nontender Extremities: 1+ pitting edema in the bilateral lower extremity.  2+ peripheral pulses in all 4 extremities.  Laboratory: Most recent CBC Lab Results  Component Value Date   WBC 12.7 (H) 04/04/2023   HGB 12.9 04/04/2023   HCT 38.2 04/04/2023   MCV 85.7 04/04/2023   PLT 208 04/04/2023   Most recent BMP    Latest Ref Rng & Units 04/04/2023    4:00 AM  BMP  Glucose 70 - 99 mg/dL 130   BUN 8 - 23 mg/dL 31   Creatinine 8.65 - 1.00 mg/dL 7.84   Sodium 696 - 295 mmol/L 124   Potassium 3.5 - 5.1 mmol/L 3.9   Chloride 98 - 111 mmol/L 88   CO2 22 - 32 mmol/L 24   Calcium 8.9 - 10.3 mg/dL 7.9     Gerrit Heck, DO 04/05/2023, 7:51 AM  PGY-1, [facility] FPTS Intern pager: [phone], text pages welcome Secure chat group [facility] Teaching Service

## 2023-04-06 DIAGNOSIS — I5082 Biventricular heart failure: Secondary | ICD-10-CM | POA: Diagnosis not present

## 2023-04-06 DIAGNOSIS — I4891 Unspecified atrial fibrillation: Secondary | ICD-10-CM | POA: Diagnosis not present

## 2023-04-06 LAB — CBC
HCT: 37.3 % (ref 36.0–46.0)
Hemoglobin: 12.7 g/dL (ref 12.0–15.0)
MCH: 29.2 pg (ref 26.0–34.0)
MCHC: 34 g/dL (ref 30.0–36.0)
MCV: 85.7 fL (ref 80.0–100.0)
Platelets: 191 10*3/uL (ref 150–400)
RBC: 4.35 MIL/uL (ref 3.87–5.11)
RDW: 14.3 % (ref 11.5–15.5)
WBC: 15.2 10*3/uL — ABNORMAL HIGH (ref 4.0–10.5)
nRBC: 0 % (ref 0.0–0.2)

## 2023-04-06 LAB — BASIC METABOLIC PANEL
Anion gap: 11 (ref 5–15)
BUN: 42 mg/dL — ABNORMAL HIGH (ref 8–23)
CO2: 26 mmol/L (ref 22–32)
Calcium: 8.2 mg/dL — ABNORMAL LOW (ref 8.9–10.3)
Chloride: 85 mmol/L — ABNORMAL LOW (ref 98–111)
Creatinine, Ser: 1.45 mg/dL — ABNORMAL HIGH (ref 0.44–1.00)
GFR, Estimated: 34 mL/min — ABNORMAL LOW (ref >60)
Glucose, Bld: 107 mg/dL — ABNORMAL HIGH (ref 70–99)
Potassium: 4.3 mmol/L (ref 3.5–5.1)
Sodium: 122 mmol/L — ABNORMAL LOW (ref 135–145)

## 2023-04-06 LAB — MAGNESIUM: Magnesium: 2.3 mg/dL (ref 1.7–2.4)

## 2023-04-06 MED ORDER — FUROSEMIDE 40 MG PO TABS
40.0000 mg | ORAL_TABLET | Freq: Two times a day (BID) | ORAL | Status: DC
  Administered 2023-04-06 – 2023-04-08 (×4): 40 mg via ORAL
  Filled 2023-04-06 (×4): qty 1

## 2023-04-06 NOTE — Assessment & Plan Note (Addendum)
Patient appears nearly euvolemic on exam today. Given poor prognosis, palliative has been consulted to discuss goals of care. - switch to PO lasix  - daily BMP  - K> 4, Mag > 2

## 2023-04-06 NOTE — TOC Progression Note (Signed)
Transition of Care [facility]) - Progression Note    Patient Details  Name: Gina Bishop MRN: [id number] Date of Birth: 10-21-1931  [facility] CM/SW Contact  Delilah Shan, LCSWA Phone Number: 04/06/2023, 1:25 PM  Clinical Narrative:     CSW LVM for patients son Minerva Areola. CSW awaiting call back to discuss SNF bed offers. CSW will continue to follow.   Expected Discharge Plan: Skilled Nursing Facility Barriers to Discharge: Continued Medical Work up  Expected Discharge Plan and Services In-house Referral: Clinical Social Work     Living arrangements for the past 2 months: Assisted Living Facility                                       Social Determinants of Health (SDOH) Interventions SDOH Screenings   Food Insecurity: No Food Insecurity (04/01/2023)  Housing: Low Risk  (04/01/2023)  Transportation Needs: No Transportation Needs (04/01/2023)  Utilities: Not At Risk (04/01/2023)  Alcohol Screen: Low Risk  (04/27/2022)  Depression (PHQ2-9): Low Risk  (04/27/2022)  Financial Resource Strain: Low Risk  (04/27/2022)  Physical Activity: Sufficiently Active (04/27/2022)  Social Connections: Moderately Isolated (04/01/2023)  Stress: No Stress Concern Present (04/27/2022)  Tobacco Use: Low Risk  (03/31/2023)    Readmission Risk Interventions     No data to display

## 2023-04-06 NOTE — Plan of Care (Signed)
  Problem: Education: Goal: Knowledge of General Education information will improve Description: Including pain rating scale, medication(s)/side effects and non-pharmacologic comfort measures Outcome: Progressing   Problem: Health Behavior/Discharge Planning: Goal: Ability to manage health-related needs will improve Outcome: Progressing   Problem: Clinical Measurements: Goal: Ability to maintain clinical measurements within normal limits will improve Outcome: Progressing Goal: Will remain free from infection Outcome: Progressing Goal: Diagnostic test results will improve Outcome: Progressing Goal: Respiratory complications will improve Outcome: Progressing Goal: Cardiovascular complication will be avoided Outcome: Progressing   Problem: Activity: Goal: Risk for activity intolerance will decrease Outcome: Progressing   Problem: Nutrition: Goal: Adequate nutrition will be maintained Outcome: Progressing   Problem: Coping: Goal: Level of anxiety will decrease Outcome: Progressing   Problem: Coping: Goal: Level of anxiety will decrease Outcome: Progressing   Problem: Elimination: Goal: Will not experience complications related to bowel motility Outcome: Progressing Goal: Will not experience complications related to urinary retention Outcome: Progressing   Problem: Pain Managment: Goal: General experience of comfort will improve and/or be controlled Outcome: Progressing   Problem: Safety: Goal: Ability to remain free from injury will improve Outcome: Progressing   Problem: Skin Integrity: Goal: Risk for impaired skin integrity will decrease Outcome: Progressing   Problem: Education: Goal: Ability to demonstrate management of disease process will improve Outcome: Progressing Goal: Ability to verbalize understanding of medication therapies will improve Outcome: Progressing Goal: Individualized Educational Video(s) Outcome: Progressing   Problem:  Activity: Goal: Capacity to carry out activities will improve Outcome: Progressing   Problem: Cardiac: Goal: Ability to achieve and maintain adequate cardiopulmonary perfusion will improve Outcome: Progressing

## 2023-04-06 NOTE — Assessment & Plan Note (Signed)
Heart rates have remained stable the 80s to 90s.  Patient has elected not to proceed with TEE/CV at this time.  Per cardiology recommendation, palliative has been consulted to discuss care options with patient and family. - Cardiology consulted, appreciate recs -Palliative consult -Continue metoprolol tartate to 25 mg TID for better rate control per cardiology - Eliquis 5 mg BID

## 2023-04-06 NOTE — Progress Notes (Signed)
Daily Progress Note Intern Pager: [phone]  Patient name: Gina Bishop Medical record number: [id number] Date of birth: 12/20/1931 Age: 89 y.o. Gender: female  Primary Care Provider: Caro Laroche, DO Consultants: Cardiology, palliative Code Status: DNR/Limited  Pt Overview and Major Events to Date:  2/13: Admitted 2/18: Palliative consulted  Assessment and Plan:  This patient is an 89 year old female with a past medical history of hypertension, HLD, chronic pain who was admitted for shortness of breath and cough likely in the setting of new onset A-fib.  She is currently rate controlled and does not desire any more invasive management. Assessment & Plan Atrial fibrillation with RVR (HCC) Heart rates have remained stable the 80s to 90s.  Patient has elected not to proceed with TEE/CV at this time.  Per cardiology recommendation, palliative has been consulted to discuss care options with patient and family. - Cardiology consulted, appreciate recs -Palliative consult -Continue metoprolol tartate to 25 mg TID for better rate control per cardiology - Eliquis 5 mg BID  Hyponatremia Sodium 122.  We do not expect this to improve at this time, given patient's heart function and fluid status. - Will continue to follow with daily BMP - switching to PO lasix - Fluid restriction Congestive heart failure (CHF) (HCC) Patient appears nearly euvolemic on exam today. Given poor prognosis, palliative has been consulted to discuss goals of care. - switch to PO lasix  - daily BMP  - K> 4, Mag > 2  AKI (acute kidney injury) (HCC) Cr 1.45, CTM - AM BMP   Chronic and Stable Problems:  CAD: Continue home pravastatin RLS: Home pramipexole Hypertension: Holding home spironolactone and benazepril in the setting of hyperkalemia on admission, consider restarting at discharge pending palliative care discussion.  FEN/GI: Heart healthy PPx: Eliquis Dispo: Return to ALF  pending clinical  improvement .    Subjective:  Patient is awake, sitting up in bed today. She expresses that she doesn't want to be bothered with invasive treatments anymore at her age. Denies any chest pain or shortness of breath. Reports that her cough has improved significantly. Denies any urinary symptoms.   Objective: Temp:  [97.5 F (36.4 C)-98.8 F (37.1 C)] 97.5 F (36.4 C) (02/19 0425) Pulse Rate:  [91] 91 (02/18 1633) Resp:  [16] 16 (02/19 0425) BP: (98-110)/(69-79) 98/70 (02/19 0425) SpO2:  [95 %-96 %] 95 % (02/19 0425) Weight:  [76.3 kg] 76.3 kg (02/19 0450) Physical Exam: General: Elderly appearing female, no distress Cardiovascular: Irregularly irregular, non-tachy. No m/r/g Respiratory: Minimal basilar crackles on the left, right lung clear. No increased WOB Abdomen: Flat, soft, non-tender. Extremities: Trace pitting edema in the BLE  Laboratory: Most recent CBC Lab Results  Component Value Date   WBC 15.2 (H) 04/06/2023   HGB 12.7 04/06/2023   HCT 37.3 04/06/2023   MCV 85.7 04/06/2023   PLT 191 04/06/2023   Most recent BMP    Latest Ref Rng & Units 04/06/2023    4:19 AM  BMP  Glucose 70 - 99 mg/dL 130   BUN 8 - 23 mg/dL 42   Creatinine 8.65 - 1.00 mg/dL 7.84   Sodium 696 - 295 mmol/L 122   Potassium 3.5 - 5.1 mmol/L 4.3   Chloride 98 - 111 mmol/L 85   CO2 22 - 32 mmol/L 26   Calcium 8.9 - 10.3 mg/dL 8.2     Gerrit Heck, DO 04/06/2023, 7:26 AM  PGY-1, [place] [facility] FPTS Intern pager: [phone], text pages  welcome Secure chat group [facility] Teaching Service

## 2023-04-06 NOTE — Progress Notes (Signed)
Occupational Therapy Treatment Patient Details Name: Gina Bishop MRN: [id number] DOB: May 16, 1931 Today's Date: 04/06/2023   History of present illness 89 y.o. female admitted 03/31/23 with SOB, cough, new onset A-fib, CHF, pleural effusion, and PNA. PMH: HTN, HLD, chronic pain, scoliosis   OT comments  Pt making good progress with functional goals. Pt very pleasant and cooperative. OT will continue to follow acutely to maximize level of function and safety      If plan is discharge home, recommend the following:  A little help with walking and/or transfers;A little help with bathing/dressing/bathroom;Assistance with cooking/housework;Assist for transportation;Help with stairs or ramp for entrance   Equipment Recommendations  None recommended by OT    Recommendations for Other Services      Precautions / Restrictions Precautions Precautions: Fall;Other (comment) Recall of Precautions/Restrictions: Intact Precaution/Restrictions Comments: urinary incontinence Restrictions Weight Bearing Restrictions Per Provider Order: No       Mobility Bed Mobility Overal bed mobility: Needs Assistance Bed Mobility: Supine to Sit, Sit to Supine     Supine to sit: Min assist Sit to supine: Min assist   General bed mobility comments: min assist to fully elevate trunk from surface and assist to lift legs to return to bed    Transfers Overall transfer level: Needs assistance Equipment used: Rolling walker (2 wheels) Transfers: Sit to/from Stand Sit to Stand: Contact guard assist, Min assist           General transfer comment: min A form EOB to RW, CGA from [facility] to RW     Balance Overall balance assessment: Needs assistance Sitting-balance support: No upper extremity supported, Feet supported Sitting balance-Leahy Scale: Good     Standing balance support: Bilateral upper extremity supported, Reliant on assistive device for balance, During functional activity Standing balance-Leahy  Scale: Fair                             ADL either performed or assessed with clinical judgement   ADL Overall ADL's : Needs assistance/impaired     Grooming: Wash/dry hands;Wash/dry face;Contact guard assist;Standing           Upper Body Dressing : Minimal assistance;Standing   Lower Body Dressing: Minimal assistance;Sit to/from stand   Toilet Transfer: Minimal assistance;Contact guard assist;Ambulation;Rollator (4 wheels);Cueing for safety;BSC/3in1   Toileting- Clothing Manipulation and Hygiene: Minimal assistance;Sit to/from stand       Functional mobility during ADLs: Minimal assistance;Contact guard assist;Rolling walker (2 wheels);Cueing for safety      Extremity/Trunk Assessment Upper Extremity Assessment Upper Extremity Assessment: Generalized weakness   Lower Extremity Assessment Lower Extremity Assessment: Defer to PT evaluation        Vision Ability to See in Adequate Light: 0 Adequate Patient Visual Report: No change from baseline     Perception     Praxis     Communication Communication Communication: No apparent difficulties   Cognition Arousal: Alert Behavior During Therapy: WFL for tasks assessed/performed Cognition: No apparent impairments                               Following commands: Intact        Cueing   Cueing Techniques: Verbal cues  Exercises      Shoulder Instructions       General Comments      Pertinent Vitals/ Pain       Pain Assessment Pain Assessment: No/denies pain  Pain Score: 0-No pain Pain Intervention(s): Monitored during session, Repositioned  Home Living Family/patient expects to be discharged to:: Other (Comment)                                 Additional Comments: pt has RW, rollator, w/c, SPC, shower chair, 3in1, grab bars in shower and next to toilet      Prior Functioning/Environment              Frequency  Min 1X/week        Progress Toward  Goals  OT Goals(current goals can now be found in the care plan section)  Progress towards OT goals: Progressing toward goals     Plan      Co-evaluation                 AM-PAC OT "6 Clicks" Daily Activity     Outcome Measure   Help from another person eating meals?: None Help from another person taking care of personal grooming?: A Little Help from another person toileting, which includes using toliet, bedpan, or urinal?: A Little Help from another person bathing (including washing, rinsing, drying)?: A Little Help from another person to put on and taking off regular upper body clothing?: A Little Help from another person to put on and taking off regular lower body clothing?: A Little 6 Click Score: 19    End of Session Equipment Utilized During Treatment: Gait belt;Rolling walker (2 wheels)  OT Visit Diagnosis: Unsteadiness on feet (R26.81);Muscle weakness (generalized) (M62.81);Other (comment)   Activity Tolerance Patient tolerated treatment well   Patient Left with call bell/phone within reach;in bed;with bed alarm set   Nurse Communication          Time: 1610-9604 OT Time Calculation (min): 26 min  Charges: OT General Charges $OT Visit: 1 Visit OT Treatments $Self Care/Home Management : 8-22 mins $Therapeutic Activity: 8-22 mins    Galen Manila 04/06/2023, 2:37 PM

## 2023-04-06 NOTE — Progress Notes (Signed)
Mobility Specialist Progress Note:    04/06/23 1205  Mobility  Activity Transferred from chair to bed  Level of Assistance Moderate assist, patient does 50-74%  Assistive Device Front wheel walker  Distance Ambulated (ft) 4 ft  Activity Response Tolerated well  Mobility Referral Yes  Mobility visit 1 Mobility  Mobility Specialist Start Time (ACUTE ONLY) 1035  Mobility Specialist Stop Time (ACUTE ONLY) 1048  Mobility Specialist Time Calculation (min) (ACUTE ONLY) 13 min   Pt received in chair requesting to get back to bed. Pt required ModA to stand d/t strong posterior lean. Upon standing pt had urinary and BM in continence. NT assisted in pericare while MS steadied pt. Pt was able to take a couple steps towards the bed w/o fault. Situated in bed w/ call bell and personal belongings in reach. NT in room.  Thompson Grayer Mobility Specialist  Please contact vis Secure Chat or  Rehab Office [phone]

## 2023-04-06 NOTE — Assessment & Plan Note (Signed)
Cr 1.45, CTM - AM BMP

## 2023-04-06 NOTE — Progress Notes (Addendum)
Patient Name: Gina Bishop Date of Encounter: 04/06/2023 [facility] Cardiologist: Parke Poisson, MD   Interval Summary  .    89 y.o. female with a history of hypertension, hyperlipidemia, and scoliosis who presented on 03/31/2023 with weakness, shortness of breath, and a cough and was found to be in new onset atrial fibrillation with RVR. Cardiology is following for A fib RVR and biventricular heart failure. Rate controlled on metoprolol. Refused TEE/DCCV. Echo showed LVEF of 20-25% with global hypokinesis and grade 2 diastolic dysfunction, severe RV dysfunction, moderately elevated PASP, and a large left pleural effusion. Not significant UOP on diuresis. Prognosis was felt overall poor.   Patient states she is feeling well, denied any chest pain, SOB, heart palpitation, dizziness, syncope. She confirmed that she does not want any TEE/DCCV.   Vital Signs .    Vitals:   04/05/23 1633 04/05/23 2000 04/06/23 0425 04/06/23 0450  BP: 110/79 106/69 98/70   Pulse: 91     Resp: 16 16 16    Temp: 97.6 F (36.4 C) 98.8 F (37.1 C) (!) 97.5 F (36.4 C)   TempSrc: Oral Axillary Oral   SpO2: 95% 96% 95%   Weight:    76.3 kg  Height:       No intake or output data in the 24 hours ending 04/06/23 0718    04/06/2023    4:50 AM 04/05/2023    3:56 AM 04/04/2023    5:07 AM  Last 3 Weights  Weight (lbs) 168 lb 3.4 oz 165 lb 12.6 oz 170 lb 3.1 oz  Weight (kg) 76.3 kg 75.2 kg 77.2 kg      Telemetry/ECG    A fib 90s - Personally Reviewed  Physical Exam .   GEN: No acute distress.  Sitting in chair.  Neck: No JVD Cardiac: Irregularly irregular, no murmurs, rubs, or gallops.  Respiratory: Fine crackles bilaterally, on room air, speaks full sentence  GI: Soft, nontender, non-distended  MS: No leg edema  Assessment & Plan .      New Onset Atrial Fibrillation - Diagnosed with new onset atrial fibrillation on admission in setting of recent URI with superimposed bacterial  pneumonia. Rates initially in the 150s. Echo showed LVEF of 20-25% with global hypokinesis and severe RV dysfunction. She was initially started IV Diltiazem but this was stopped given reduced EF. - Rates well controlled in the 90s now  - Continue Lopressor 25mg  TID, may convert to Metoprolol XL 75mg  daily  - CHA2D2-VASc = 6 (coronary artery calcifications noted on CT, CHF, HTN, age x2, female). She has been started on Eliquis 5mg  twice daily. Tolerating.  - She had refused TEE/DCCV   Acute Combined CHF Biventricular Failure - BNP markedly elevated at 1,142. Chest CTA showed cardiomegaly with elevated right heart pressure and at least some degree of right heart failure as well as moderate right and small left pleural effusion. Echo showed LVEF of 20-25% with global hypokinesis and grade 2 diastolic dysfunction, severe RV dysfunction, moderately elevated PASP, and a large left pleural effusion. She was started on IV Lasix. UOP not accurate. Net negative 1.4L admission. Weight is down from 176.37 >>168.21ibs since admission  - clinical volume exam seems improved today, will transition from IV Lasix 80mg  BID to PO Lasix 40mg  BID today  - GDMT: convert to Metoprolol XL 75mg  daily at the time of discharge; BP is low, unable add additional therapy at this time, if able would resume PTA spironolactone   Likely Moderate  Low-Flow Low Gradient Aortic Stenosis - Per Dr. Lupe Carney note on 04/03/2023: "Though gradients are low, stroke-volume index is low and DVI 0.38, might be lower given that the aortic valve signal was not measured at the highest value. Valve appears severely calcified and has moderate to severely reduced opening. " - Will need to re-evaluate this as an outpatient when patient is in sinus rhythm for true gradient.    Moderate to Severe Mitral Regurgitation - MR read as mild on Echo report but Dr. Jacques Navy felt this was more moderate to severe. Likely secondary to severe left atrial enlargement  and LV function. - Will continue to treat CHF as above and hope that this improves. - Can re-evaluate as an outpatient.   AKI Baseline creatinine around 0.8. Creatinine was 1.06 on admission and peaked at 1.30 on 04/02/2023.  - Creatinine up to 1.45 now  - transition to PO Lasix today    Hyponatremia - Na 122 today, seems overall stable, per primary team.     For questions or updates, please contact [facility] Please consult [email or web address] for contact info under        Signed, Cyndi Bender, NP    Attending Note:   The patient was seen and examined.  Agree with assessment and plan as noted above.  Changes made to the above note as needed.  Patient seen and independently examined with Cyndi Bender, NP .   We discussed all aspects of the encounter. I agree with the assessment and plan as stated above.   Atrial fibrillation: She was diagnosed with new onset atrial fibrillation in the setting of a URI without superimposed bacterial pneumonia.  Her rates are much better controlled.  She does not want to have a TEE cardioversion.  She is on Eliquis 5 mg twice a day  2.  Acute combined congestive heart failure/biventricular failure: She has severely reduced LV function as well as RV function.  She is diuresed some.  She appears to be euvolemic today.  Blood pressure is low and her creatinine is up .  Will hold diuretics today   3.   Low flow low gradient AS.   Continue conservative care   4.  Mitral regurgitation :   continue conservative care   5.  Hyponatremia:  per primary team   6.  Goals of care:   her prognosis is poor.  She is not interested in having procedures done .   At age 89, I'm not inclined to push her to have procedures that she does not want to have  She has severe LV and RV dysfunction          I have spent a total of 40 minutes with patient reviewing hospital  notes , telemetry, EKGs, labs and examining patient as well as establishing an assessment and  plan that was discussed with the patient.  > 50% of time was spent in direct patient care.    Vesta Mixer, Montez Hageman., MD, [facility] 04/06/2023, 9:51 AM [address],  [address] Office [phone] Pager [phone]

## 2023-04-06 NOTE — Assessment & Plan Note (Addendum)
Sodium 122.  We do not expect this to improve at this time, given patient's heart function and fluid status. - Will continue to follow with daily BMP - switching to PO lasix - Fluid restriction

## 2023-04-07 DIAGNOSIS — R531 Weakness: Secondary | ICD-10-CM

## 2023-04-07 DIAGNOSIS — I5043 Acute on chronic combined systolic (congestive) and diastolic (congestive) heart failure: Secondary | ICD-10-CM

## 2023-04-07 DIAGNOSIS — Z515 Encounter for palliative care: Secondary | ICD-10-CM

## 2023-04-07 DIAGNOSIS — N179 Acute kidney failure, unspecified: Secondary | ICD-10-CM

## 2023-04-07 DIAGNOSIS — I509 Heart failure, unspecified: Secondary | ICD-10-CM | POA: Diagnosis not present

## 2023-04-07 DIAGNOSIS — Z66 Do not resuscitate: Secondary | ICD-10-CM

## 2023-04-07 DIAGNOSIS — I4891 Unspecified atrial fibrillation: Secondary | ICD-10-CM | POA: Diagnosis not present

## 2023-04-07 LAB — BASIC METABOLIC PANEL
Anion gap: 11 (ref 5–15)
BUN: 43 mg/dL — ABNORMAL HIGH (ref 8–23)
CO2: 25 mmol/L (ref 22–32)
Calcium: 8.1 mg/dL — ABNORMAL LOW (ref 8.9–10.3)
Chloride: 88 mmol/L — ABNORMAL LOW (ref 98–111)
Creatinine, Ser: 1.22 mg/dL — ABNORMAL HIGH (ref 0.44–1.00)
GFR, Estimated: 42 mL/min — ABNORMAL LOW (ref >60)
Glucose, Bld: 101 mg/dL — ABNORMAL HIGH (ref 70–99)
Potassium: 4.2 mmol/L (ref 3.5–5.1)
Sodium: 124 mmol/L — ABNORMAL LOW (ref 135–145)

## 2023-04-07 LAB — MAGNESIUM: Magnesium: 2.1 mg/dL (ref 1.7–2.4)

## 2023-04-07 MED ORDER — METOPROLOL TARTRATE 25 MG PO TABS
25.0000 mg | ORAL_TABLET | Freq: Three times a day (TID) | ORAL | Status: AC
  Filled 2023-04-07 (×2): qty 1

## 2023-04-07 MED ORDER — METOPROLOL SUCCINATE ER 50 MG PO TB24
75.0000 mg | ORAL_TABLET | Freq: Every day | ORAL | Status: DC
  Administered 2023-04-08: 75 mg via ORAL
  Filled 2023-04-07: qty 1

## 2023-04-07 NOTE — Assessment & Plan Note (Signed)
Cr 1.45, CTM - AM BMP

## 2023-04-07 NOTE — Assessment & Plan Note (Addendum)
Heart rates have remained stable the 80s to 90s.  Patient has elected not to proceed with TEE/CV at this time.  Per cardiology recommendation, palliative has been consulted to discuss care options with patient and family. - Cardiology consulted, appreciate recs -Palliative consult, appreciate recommendations -Continue metoprolol tartate to 25 mg TID for better rate control per cardiology, goal to change to metoprolol XL 75 mg daily at d/c - Eliquis 5 mg BID

## 2023-04-07 NOTE — Assessment & Plan Note (Signed)
Sodium 122.  We do not expect this to improve at this time, given patient's heart function and fluid status. - Will continue to follow with daily BMP - PO lasix 40 mg BID - Fluid restriction

## 2023-04-07 NOTE — Assessment & Plan Note (Signed)
Patient appears nearly euvolemic on exam today. Given poor prognosis, palliative has been consulted to discuss goals of care. - PO lasix  - daily BMP  - K> 4, Mag > 2

## 2023-04-07 NOTE — Plan of Care (Signed)
  Problem: Clinical Measurements: Goal: Ability to maintain clinical measurements within normal limits will improve Outcome: Progressing   Problem: Pain Managment: Goal: General experience of comfort will improve and/or be controlled Outcome: Progressing   Problem: Skin Integrity: Goal: Risk for impaired skin integrity will decrease Outcome: Progressing   Problem: Activity: Goal: Capacity to carry out activities will improve Outcome: Progressing   Problem: Cardiac: Goal: Ability to achieve and maintain adequate cardiopulmonary perfusion will improve Outcome: Progressing

## 2023-04-07 NOTE — Assessment & Plan Note (Signed)
CTA notable with extensive multi-vessel coronary artery calcification.  Lipid panel with LDL 53.  - Pravastatin 40 mg daily

## 2023-04-07 NOTE — Consult Note (Signed)
Consultation Note Date: 04/07/2023   Patient Name: Gina Bishop  DOB: 1931-04-19  MRN: [id number]  Age / Sex: 89 y.o., female  PCP: Caro Laroche, DO Referring Physician: Caro Laroche, DO  Reason for Consultation: Establishing goals of care  HPI/Patient Profile: 89 y.o. female  admitted on 03/31/2023 with  Past  medical history significant of chronic HF ( ECHO 20-25% ), hypertension, hyperlipidemia, osteoarthritis, scoliosis, chronic pain, essential tremor, RLS, anxiety, seasonal allergies presented to ED via EMS from her independent living facility for evaluation of shortness of breath, cough, and new onset A-fib.    Recently treated with antibiotics for presumed pneumonia.  Found to have A-fib with high heart rate today at her facility.      CT angiogram chest showing: ( 03-31-23) "IMPRESSION: 1. No pulmonary embolism. 2. Cardiomegaly with elevated right heart pressures and at least some degree of right heart failure. 3. Moderate right and small left pleural effusions with associated bibasilar compressive atelectasis. 4. Extensive multi-vessel coronary artery calcification. 5. Subpleural sparse pulmonary infiltrate within the right upper lobe, may reflect an infectious or inflammatory infiltrate or post inflammatory scarring, but is indeterminate. Short-term follow-up imaging in 3 months is recommended to document stability or resolution.  Patient and family face treatment option decisions, advanced directive decisions and anticipatory care needs.     Clinical Assessment and Goals of Care:   This NP Lorinda Creed reviewed medical records, received report from team, assessed the patient and then meet at the patient's bedside  to discuss diagnosis, prognosis, GOC, EOL wishes disposition and options.   Concept of Palliative Care was introduced as specialized medical care for people and their  families living with serious illness.  If focuses on providing relief from the symptoms and stress of a serious illness.  The goal is to improve quality of life for both the patient and the family.  Created space and opportunity for patient  and family to explore thoughts and feelings regarding current medical situation     A  discussion was had today regarding advanced directives.  Concepts specific to code status, artifical feeding and hydration, continued IV antibiotics and rehospitalization was had.  The difference between a aggressive medical intervention path  and a palliative comfort care path for this patient at this time was had.  Values and goals of care important to patient and family were attempted to be elicited.   MOST form    Natural trajectory and expectations at EOL were discussed.  Questions and concerns addressed.  Patient  encouraged to call with questions or concerns.     PMT will continue to support holistically.       No documented H POA or advance care planning documents noted in EMR      SUMMARY OF RECOMMENDATIONS    Code Status/Advance Care Planning: Limited code   Symptom Management:  ***  Palliative Prophylaxis:  Delirium Protocol and Frequent Pain Assessment  Additional Recommendations (Limitations, Scope, Preferences): {Recommended Scope and Preferences:21019}  Psycho-social/Spiritual:  Desire for further  Chaplaincy support:{YES NW:29562} Additional Recommendations: Education on Hospice  Prognosis:  {Palliative Care Prognosis:23504}  Discharge Planning: To Be Determined      Primary Diagnoses: Present on Admission:  Hyponatremia   I have reviewed the medical record, interviewed the patient and family, and examined the patient. The following aspects are pertinent.  Past Medical History:  Diagnosis Date   Allergy    Arthritis    Blood transfusion    Hyperlipidemia    Hypertension    Hypomagnesemia 04/01/2023   Pleural effusion  04/01/2023   Pneumonia 04/01/2023   Scoliosis    Social History   Socioeconomic History   Marital status: Married    Spouse name: John   Number of children: 3   Years of education: 12   Highest education level: Not on file  Occupational History   Occupation: Retired- Banker  Tobacco Use   Smoking status: Never   Smokeless tobacco: Never  Vaping Use   Vaping status: Never Used  Substance and Sexual Activity   Alcohol use: No   Drug use: No   Sexual activity: Not Currently  Other Topics Concern   Not on file  Social History Narrative   Health Care POA:    Emergency Contact: husband, Jonny Ruiz (c) [phone] or son, Minerva Areola (c) [phone]   End of Life Plan: Pt reports giving living will copy to Dr. Leveda Anna   Who lives with you: husband and son   Any pets: none   Diet: Pt have a varied diet of protein, starch, fruits and vegetables.   Exercise: Pt exercises at [facility] 5-6 times a week. Pt reports participating in chair yoga, low impact aerobic classes, and swimming.   Seatbelts: Pt reports wearing seatbelt when in vehicles.    Wynelle Link Exposure/Protection: Pt reports wearing sunscreen and hat when outside.   Hobbies: reading and walking at the beach         Current Social History 11/02/2016                                                                                                     Social Drivers of Health   Financial Resource Strain: Low Risk  (04/27/2022)   Overall Financial Resource Strain (CARDIA)    Difficulty of Paying Living Expenses: Not hard at all  Food Insecurity: No Food Insecurity (04/01/2023)   Hunger Vital Sign    Worried About Running Out of Food in the Last Year: Never true    Ran Out of Food in the Last Year: Never true  Transportation Needs: No Transportation Needs (04/01/2023)   PRAPARE - Administrator, Civil Service (Medical): No    Lack of Transportation (Non-Medical): No  Physical Activity: Sufficiently Active (04/27/2022)   Exercise  Vital Sign    Days of Exercise per Week: 5 days    Minutes of Exercise per Session: 60 min  Stress: No Stress Concern Present (04/27/2022)   [employer] of Occupational Health - Occupational Stress Questionnaire    Feeling of Stress : Not at all  Social Connections: Moderately Isolated (04/01/2023)  Social Advertising account executive [NHANES]    Frequency of Communication with Friends and Family: More than three times a week    Frequency of Social Gatherings with Friends and Family: More than three times a week    Attends Religious Services: Never    Database administrator or Organizations: Yes    Attends Engineer, structural: More than 4 times per year    Marital Status: Widowed   Family History  Problem Relation Age of Onset   Kidney disease Mother    Cancer Mother        Kidney   Heart disease Father    Cancer Brother        Colon   Diabetes Son        T1DM   Rosacea Son    Scheduled Meds:  apixaban  5 mg Oral BID   furosemide  40 mg Oral BID   gabapentin  100 mg Oral BID AC   metoprolol tartrate  25 mg Oral TID   polyethylene glycol  17 g Oral BID   pramipexole  0.5 mg Oral QHS   pravastatin  40 mg Oral QHS   senna  1 tablet Oral BID   Continuous Infusions: PRN Meds:.acetaminophen, guaiFENesin-dextromethorphan Medications Prior to Admission:  Prior to Admission medications   Medication Sig Start Date End Date Taking? Authorizing Provider  albuterol (VENTOLIN HFA) 108 (90 Base) MCG/ACT inhaler Inhale 2 puffs into the lungs every 6 (six) hours as needed for shortness of breath. 03/25/23  Yes [provider]  azelastine (ASTELIN) 0.1 % nasal spray Place 1 spray into both nostrils 2 (two) times daily. Use in each nostril as directed Patient taking differently: Place 1 spray into both nostrils daily as needed for allergies. Use in each nostril as directed 10/19/22  Yes Caro Laroche, DO  benazepril (LOTENSIN) 40 MG tablet TAKE 1 TABLET(40 MG) BY  MOUTH AT BEDTIME 10/19/22  Yes Rumball, Darl Householder, DO  docusate sodium (COLACE) 100 MG capsule Take 100 mg by mouth daily as needed for mild constipation.   Yes [provider]  gabapentin (NEURONTIN) 100 MG capsule TAKE 1 CAPSULE BY MOUTH THREE TIMES DAILY Patient taking differently: Take 100 mg by mouth 2 (two) times daily before lunch and supper. 04/06/21  Yes Moses Manners, MD  pramipexole (MIRAPEX) 0.5 MG tablet TAKE 1 TABLET(0.5 MG) BY MOUTH AT BEDTIME 07/29/21  Yes Hensel, Santiago Bumpers, MD  pravastatin (PRAVACHOL) 40 MG tablet TAKE 1 TABLET BY MOUTH DAILY Patient taking differently: Take 40 mg by mouth at bedtime. TAKE 1 TABLET BY MOUTH DAILY 01/25/22  Yes Hensel, Santiago Bumpers, MD  spironolactone (ALDACTONE) 25 MG tablet Take 1 tablet (25 mg total) by mouth daily. 10/19/22  Yes Caro Laroche, DO  spironolactone (ALDACTONE) 25 MG tablet TAKE 1/2 TABLET(12.5 MG) BY MOUTH AT BEDTIME 09/21/21   Moses Manners, MD   Allergies  Allergen Reactions   Fish Allergy    Orange Juice [Orange Oil]    Strawberry Extract    Vicodin [Hydrocodone-Acetaminophen] Other (See Comments)    hallucinatations and nightmares   Erythromycin Diarrhea   Amlodipine     Leg swelling   Fexofenadine     Sedated in the 180 mg dose.   Amoxicillin Nausea And Vomiting   Hydrochlorothiazide Other (See Comments)    Hyponatremia while on HCTZ   Review of Systems  Neurological:  Positive for weakness.    Physical Exam Cardiovascular:  Rate and Rhythm: Normal rate.  Pulmonary:     Effort: Pulmonary effort is normal.  Musculoskeletal:     Right lower leg: Edema present.     Left lower leg: Edema present.  Skin:    General: Skin is warm and dry.  Neurological:     Mental Status: She is alert and oriented to person, place, and time.     Vital Signs: BP 115/72 (BP Location: Right Arm)   Pulse 98   Temp 97.7 F (36.5 C) (Oral)   Resp 17   Ht 5\' 5"  (1.651 m)   Wt 77.9 kg   SpO2 95%   BMI 28.58  kg/m  Pain Scale: 0-10   Pain Score: 0-No pain   SpO2: SpO2: 95 % O2 Device:SpO2: 95 % O2 Flow Rate: .   IO: Intake/output summary:  Intake/Output Summary (Last 24 hours) at 04/07/2023 0934 Last data filed at 04/07/2023 0932 Gross per 24 hour  Intake 478 ml  Output 400 ml  Net 78 ml    LBM: Last BM Date : 04/06/23 Baseline Weight: Weight: 79.8 kg Most recent weight: Weight: 77.9 kg      Palliative Assessment/Data: 40 %      Time: 75 minutes    Signed by: Lorinda Creed, NP   Please contact Palliative Medicine Team phone at [phone] for questions and concerns.  For individual provider: See Loretha Stapler

## 2023-04-07 NOTE — TOC Progression Note (Signed)
Transition of Care [facility]) - Progression Note    Patient Details  Name: Gina Bishop MRN: [id number] Date of Birth: 09-16-1931  [facility] CM/SW Contact  Delilah Shan, LCSWA Phone Number: 04/07/2023, 12:06 PM  Clinical Narrative:     CSW spoke with Minerva Areola patients son who confirmed he accepts SN[facility] bed [facility]. Facility confirmed they have SNF bed for patient when medically ready. CSW will continue to follow.  Expected Discharge Plan: Skilled Nursing Facility Barriers to Discharge: Continued Medical Work up  Expected Discharge Plan and Services In-house Referral: Clinical Social Work     Living arrangements for the past 2 months: Assisted Living Facility                                       Social Determinants of Health (SDOH) Interventions SDOH Screenings   Food Insecurity: No Food Insecurity (04/01/2023)  Housing: Low Risk  (04/01/2023)  Transportation Needs: No Transportation Needs (04/01/2023)  Utilities: Not At Risk (04/01/2023)  Alcohol Screen: Low Risk  (04/27/2022)  Depression (PHQ2-9): Low Risk  (04/27/2022)  Financial Resource Strain: Low Risk  (04/27/2022)  Physical Activity: Sufficiently Active (04/27/2022)  Social Connections: Moderately Isolated (04/01/2023)  Stress: No Stress Concern Present (04/27/2022)  Tobacco Use: Low Risk  (03/31/2023)    Readmission Risk Interventions     No data to display

## 2023-04-07 NOTE — Progress Notes (Signed)
Daily Progress Note Intern Pager: [phone]  Patient name: Gina Bishop Medical record number: [id number] Date of birth: [birth year] Age: 89 y.o. Gender: female  Primary Care Provider: Caro Laroche, DO Consultants: Cardiology, palliative Code Status: DNR/Limited   Pt Overview and Major Events to Date:  2/13: Admitted 2/18: Palliative consulted   Assessment and Plan:   This patient is an 89 year old female with a past medical history of hypertension, HLD, chronic pain who was admitted for shortness of breath and cough likely in the setting of new onset A-fib.  She is currently rate controlled and does not desire any more invasive management. Assessment & Plan Atrial fibrillation with RVR (HCC) Heart rates have remained stable the 80s to 90s.  Patient has elected not to proceed with TEE/CV at this time.  Per cardiology recommendation, palliative has been consulted to discuss care options with patient and family. - Cardiology consulted, appreciate recs -Palliative consult, appreciate recommendations -Continue metoprolol tartate to 25 mg TID for better rate control per cardiology, goal to change to metoprolol XL 75 mg daily at d/c - Eliquis 5 mg BID  Hyponatremia Sodium 122.  We do not expect this to improve at this time, given patient's heart function and fluid status. - Will continue to follow with daily BMP - PO lasix 40 mg BID - Fluid restriction Congestive heart failure (CHF) (HCC) Patient appears nearly euvolemic on exam today. Given poor prognosis, palliative has been consulted to discuss goals of care. - PO lasix  - daily BMP  - K> 4, Mag > 2  AKI (acute kidney injury) (HCC) Cr 1.45, CTM - AM BMP  CAD (coronary artery disease) CTA notable with extensive multi-vessel coronary artery calcification.  Lipid panel with LDL 53.  - Pravastatin 40 mg daily  Chronic and Stable Problems:  CAD: Continue home pravastatin RLS: Home pramipexole Hypertension: Holding home  spironolactone and benazepril in the setting of hyperkalemia on admission, consider restarting at discharge pending palliative care discussion.   FEN/GI: Heart healthy PPx: Eliquis Dispo: Return to ALF  pending clinical improvement .  Subjective:  No concerns this morning.  She had some good sleep.  Does not flex her legs are very swollen.  No heart palpitations, chest pain, shortness of breath.  Objective: Temp:  [97.5 F (36.4 C)-97.6 F (36.4 C)] 97.6 F (36.4 C) (02/20 0255) Pulse Rate:  [78-103] 93 (02/20 0255) Resp:  [16-17] 17 (02/20 0255) BP: (90-104)/(58-70) 104/68 (02/20 0255) SpO2:  [92 %-96 %] 92 % (02/20 0255) Weight:  [76.3 kg] 76.3 kg (02/19 0450) Physical Exam: General: Sitting up in bed drinking coffee, no acute distress Cardiovascular: Irregular rhythm, normal rate, no murmurs rubs or gallops, bilateral lower extremities with 1+ pitting edema to midshin Respiratory: Clear to auscultation bilaterally anteriorly Abdomen: Soft, nontender, nondistended, normoactive bowel sounds Extremities: Moves all extremities grossly equally  Laboratory: Most recent CBC Lab Results  Component Value Date   WBC 15.2 (H) 04/06/2023   HGB 12.7 04/06/2023   HCT 37.3 04/06/2023   MCV 85.7 04/06/2023   PLT 191 04/06/2023   Most recent BMP    Latest Ref Rng & Units 04/06/2023    4:19 AM  BMP  Glucose 70 - 99 mg/dL 403   BUN 8 - 23 mg/dL 42   Creatinine 4.74 - 1.00 mg/dL 2.59   Sodium 563 - 875 mmol/L 122   Potassium 3.5 - 5.1 mmol/L 4.3   Chloride 98 - 111 mmol/L 85   CO2 22 -  32 mmol/L 26   Calcium 8.9 - 10.3 mg/dL 8.2    Evette Georges, MD 04/07/2023, 3:06 AM  PGY-2, [facility] FPTS Intern pager: [phone], text pages welcome Secure chat group [facility] Teaching Service

## 2023-04-07 NOTE — Care Management Important Message (Signed)
Important Message  Patient Details  Name: Gina Bishop MRN: [id number] Date of Birth: 1931/06/08   Important Message Given:  Yes - Medicare IM     Renie Ora 04/07/2023, 11:08 AM

## 2023-04-07 NOTE — Progress Notes (Signed)
Physical Therapy Treatment Patient Details Name: Gina Bishop MRN: [id number] DOB: 05-18-1931 Today's Date: 04/07/2023   History of Present Illness 89 y.o. female admitted 03/31/23 with SOB, cough, new onset A-fib, CHF, pleural effusion, and PNA. PMH: HTN, HLD, chronic pain, scoliosis    PT Comments  The pt continues to demonstrate deficits in lower extremity strength and muscular power, evident by her needing minA to stand from a low toilet surface but only CGA to stand from a slightly higher surface (EOB). Focused session on gait training and on balance training, cuing pt to reach off her COG in sitting and standing to perform various tasks, like donning socks, washing her legs, and performing pericare. She continues to display deficits in balance, relying on 1 UE support for dynamic standing tasks. Will continue to follow acutely.   If plan is discharge home, recommend the following: Assistance with cooking/housework;Assist for transportation;A little help with walking and/or transfers;A little help with bathing/dressing/bathroom   Can travel by private vehicle     Yes  Equipment Recommendations  None recommended by PT    Recommendations for Other Services       Precautions / Restrictions Precautions Precautions: Fall;Other (comment) Recall of Precautions/Restrictions: Intact Precaution/Restrictions Comments: urinary incontinence Restrictions Weight Bearing Restrictions Per Provider Order: No     Mobility  Bed Mobility Overal bed mobility: Needs Assistance Bed Mobility: Supine to Sit     Supine to sit: Contact guard, HOB elevated, Used rails     General bed mobility comments: Extra time and use of HOB elevated to transition supine to sit L EOB, CGA for safety    Transfers Overall transfer level: Needs assistance Equipment used: Rolling walker (2 wheels) Transfers: Sit to/from Stand Sit to Stand: Min assist, Contact guard assist           General transfer comment:  Needs cues to scoot to edge of bed prior to standing. CGA to stand from EOB but minA to power up to stand from low commode.    Ambulation/Gait Ambulation/Gait assistance: Contact guard assist Gait Distance (Feet): 25 Feet (x2 bouts of ~15 ft > ~25 ft) Assistive device: Rolling walker (2 wheels) Gait Pattern/deviations: Step-through pattern, Decreased stride length, Trunk flexed Gait velocity: reduced Gait velocity interpretation: <1.31 ft/sec, indicative of household ambulator   General Gait Details: Pt ambulates short distances with short, slow steps, often stopping her RW in between steps. CGA for safety   Stairs             Wheelchair Mobility     Tilt Bed    Modified Rankin (Stroke Patients Only)       Balance Overall balance assessment: Needs assistance Sitting-balance support: No upper extremity supported, Feet supported Sitting balance-Leahy Scale: Good Sitting balance - Comments: Able to obtain figure 4 position and lift trunk partially off bed surface to try to reach to donn socks in bed. Able to perform dynamic reaching off COG while sitting on toilet, washing her lower extremities with a rag, no LOB, supervision for safety   Standing balance support: Bilateral upper extremity supported, During functional activity, Single extremity supported, Reliant on assistive device for balance Standing balance-Leahy Scale: Poor Standing balance comment: Reliant on 1 UE support for dynamic reaching to perform pericare while standing. Reliant on RW to ambulate. No LOB, CGA for safety                            Communication Communication Communication:  No apparent difficulties  Cognition Arousal: Alert Behavior During Therapy: WFL for tasks assessed/performed   PT - Cognitive impairments: No apparent impairments                         Following commands: Intact      Cueing Cueing Techniques: Verbal cues  Exercises Other Exercises Other  Exercises: dynamic sitting and standing activities, x3 bouts of ~2-5 min each, see Balance section above for details    General Comments        Pertinent Vitals/Pain Pain Assessment Pain Assessment: Faces Faces Pain Scale: No hurt Pain Intervention(s): Monitored during session    Home Living                          Prior Function            PT Goals (current goals can now be found in the care plan section) Acute Rehab PT Goals Patient Stated Goal: to get better PT Goal Formulation: With patient Time For Goal Achievement: 04/16/23 Potential to Achieve Goals: Good Progress towards PT goals: Progressing toward goals    Frequency    Min 1X/week      PT Plan      Co-evaluation              AM-PAC PT "6 Clicks" Mobility   Outcome Measure  Help needed turning from your back to your side while in a flat bed without using bedrails?: A Little Help needed moving from lying on your back to sitting on the side of a flat bed without using bedrails?: A Little Help needed moving to and from a bed to a chair (including a wheelchair)?: A Little Help needed standing up from a chair using your arms (e.g., wheelchair or bedside chair)?: A Little Help needed to walk in hospital room?: A Little Help needed climbing 3-5 steps with a railing? : Total 6 Click Score: 16    End of Session Equipment Utilized During Treatment: Gait belt Activity Tolerance: Patient tolerated treatment well Patient left: in chair;with call bell/phone within reach;with chair alarm set Nurse Communication: Mobility status (NT) PT Visit Diagnosis: Other abnormalities of gait and mobility (R26.89);Muscle weakness (generalized) (M62.81);Difficulty in walking, not elsewhere classified (R26.2);Unsteadiness on feet (R26.81)     Time: 5409-8119 PT Time Calculation (min) (ACUTE ONLY): 28 min  Charges:    $Gait Training: 8-22 mins $Neuromuscular Re-education: 8-22 mins PT General Charges $$  ACUTE PT VISIT: 1 Visit                     Virgil Benedict, PT, DPT Acute Rehabilitation Services  Office: [phone]    Bettina Gavia 04/07/2023, 6:24 PM

## 2023-04-07 NOTE — Progress Notes (Addendum)
Patient Name: Gina Bishop Date of Encounter: 04/07/2023 [facility] Cardiologist: Parke Poisson, MD   Interval Summary  .    89 y.o. female with a history of hypertension, hyperlipidemia, and scoliosis who presented on 03/31/2023 with weakness, shortness of breath, and a cough and was found to be in new onset atrial fibrillation with RVR. Cardiology is following for A fib RVR and biventricular heart failure. Rate controlled on metoprolol. Refused TEE/DCCV. Echo showed LVEF of 20-25% with global hypokinesis and grade 2 diastolic dysfunction, severe RV dysfunction, moderately elevated PASP, and a large left pleural effusion.    Patient states she feels well, denied any SOB, heart palpitation, dizziness. She states she has not been OOB walking much, yesterday she was sitting in the chair during the day for a while. She lives in an assisted living facility, and wants to go home.   Vital Signs .    Vitals:   04/06/23 1615 04/06/23 2017 04/07/23 0255 04/07/23 0500  BP: (!) 90/58 90/61 104/68   Pulse: 90 78 93   Resp: 17 16 17    Temp: 97.6 F (36.4 C) 97.6 F (36.4 C) 97.6 F (36.4 C)   TempSrc: Oral Oral Oral   SpO2: 94% 96% 92%   Weight:    77.9 kg  Height:        Intake/Output Summary (Last 24 hours) at 04/07/2023 0750 Last data filed at 04/07/2023 0300 Gross per 24 hour  Intake 118 ml  Output 400 ml  Net -282 ml      04/07/2023    5:00 AM 04/06/2023    4:50 AM 04/05/2023    3:56 AM  Last 3 Weights  Weight (lbs) 171 lb 11.8 oz 168 lb 3.4 oz 165 lb 12.6 oz  Weight (kg) 77.9 kg 76.3 kg 75.2 kg      Telemetry/ECG    A fib with ventricular rate of 110 this morning  - Personally Reviewed  Physical Exam .   GEN: No acute distress.   Neck: No JVD, neck is short and thick limiting exam  Cardiac: Irregularly irregular, no murmur  Respiratory: Clear but diminished at base to auscultation bilaterally. On room air. Speaks full sentence GI: Soft, nontender,  non-distended  MS: No leg edema  Assessment & Plan .     New Onset Atrial Fibrillation - new onset atrial fibrillation on admission in setting of recent URI with superimposed bacterial pneumonia. Rates initially in the 150s. TSH WNL from 04/01/23. Echo showed LVEF of 20-25% with global hypokinesis and severe RV dysfunction. She was initially started IV Diltiazem but this was stopped given reduced EF. - Rates well controlled in the 90s now  - Continue Lopressor 25mg  TID, may convert to Metoprolol XL 75mg  daily at discharge  - CHA2D2-VASc = 6 (coronary artery calcifications noted on CT, CHF, HTN, age x2, female). She has been started on Eliquis 5mg  twice daily. Tolerating.  - She had refused TEE/DCCV and wishes conservative management for her medical conditions which is reasonable for her age    Acute Combined CHF Biventricular Failure - BNP markedly elevated at 1,142. Chest CTA showed cardiomegaly with elevated right heart pressure and at least some degree of right heart failure as well as moderate right and small left pleural effusion. Echo showed LVEF of 20-25% with global hypokinesis and grade 2 diastolic dysfunction, severe RV dysfunction, moderately elevated PASP, and a large left pleural effusion. She was started on IV Lasix. UOP not accurate. Net negative 1.7L  admission. Weight is down from 176.37 >>171.74 ibs since admission  - clinical volume exam improved, transitioned from IV Lasix 80mg  BID to PO Lasix 40mg  BID, renal index improved today, will continue this at time of discharge  - GDMT: convert to Metoprolol XL 75mg  daily at the time of discharge; BP is low, unable add additional therapy at this time, if able would resume PTA spironolactone  - discussed limitation of salt and fluid intake    Likely Moderate Low-Flow Low Gradient Aortic Stenosis - Per Dr. Lupe Carney note on 04/03/2023: "Though gradients are low, stroke-volume index is low and DVI 0.38, might be lower given that the aortic  valve signal was not measured at the highest value. Valve appears severely calcified and has moderate to severely reduced opening. " - would aim to manage CHF symptoms, she is not interested in any invasive workup or treatment    Moderate to Severe Mitral Regurgitation - MR read as mild on Echo report but Dr. Jacques Navy felt this was more moderate to severe. Likely secondary to severe left atrial enlargement and LV function. - Will continue to treat CHF as above and hope that this improves.   AKI - Baseline creatinine around 0.8. Creatinine was 1.06 on admission and peaked at 1.30 on 04/02/2023.  - Creatinine up to 1.45, down to 1.22 today, tolerating PO lasix transition, maybe over-diuresis   Hyponatremia - Na 124 today, improved, per primary team     For questions or updates, please contact [facility] Please consult [email or web address] for contact info under        Signed, Cyndi Bender, NP    Attending Note:   The patient was seen and examined.  Agree with assessment and plan as noted above.  Changes made to the above note as needed.  Patient seen and independently examined with Cyndi Bender  NP.   We discussed all aspects of the encounter. I agree with the assessment and plan as stated above.    Acute combined CHF, Bi V chf;    She does not want to have any cardiac procedures.  Would like to go back to her nursing facility .   She was able to walk around some prior to admission but has gone downhill recently .   Cont lasix PO  Will change the metoprolol tartrate to succinate  I agree with Palliative care consult  She may follow up with Dr. Jacques Navy in the office      [facility] will sign off.   Medication Recommendations:  cont current cardiac meds  Other recommendations (labs, testing, etc):  Follow up as an outpatient:  with Dr. Jacques Navy      I have spent a total of 40 minutes with patient reviewing hospital  notes , telemetry, EKGs, labs and examining  patient as well as establishing an assessment and plan that was discussed with the patient.  > 50% of time was spent in direct patient care.    Vesta Mixer, Montez Hageman., MD, [facility] 04/07/2023, 1:08 PM [address],  [address] Office [phone] Pager [phone]

## 2023-04-08 DIAGNOSIS — I4891 Unspecified atrial fibrillation: Secondary | ICD-10-CM | POA: Diagnosis not present

## 2023-04-08 LAB — BASIC METABOLIC PANEL
Anion gap: 12 (ref 5–15)
BUN: 45 mg/dL — ABNORMAL HIGH (ref 8–23)
CO2: 28 mmol/L (ref 22–32)
Calcium: 8.3 mg/dL — ABNORMAL LOW (ref 8.9–10.3)
Chloride: 85 mmol/L — ABNORMAL LOW (ref 98–111)
Creatinine, Ser: 1.22 mg/dL — ABNORMAL HIGH (ref 0.44–1.00)
GFR, Estimated: 42 mL/min — ABNORMAL LOW (ref >60)
Glucose, Bld: 99 mg/dL (ref 70–99)
Potassium: 4.4 mmol/L (ref 3.5–5.1)
Sodium: 125 mmol/L — ABNORMAL LOW (ref 135–145)

## 2023-04-08 LAB — MAGNESIUM: Magnesium: 2 mg/dL (ref 1.7–2.4)

## 2023-04-08 MED ORDER — PRAVASTATIN SODIUM 40 MG PO TABS: 40.0000 mg | ORAL_TABLET | Freq: Every day | ORAL | Status: AC

## 2023-04-08 MED ORDER — APIXABAN 5 MG PO TABS: 5.0000 mg | ORAL_TABLET | Freq: Two times a day (BID) | ORAL | Status: DC

## 2023-04-08 MED ORDER — POLYETHYLENE GLYCOL 3350 17 G PO PACK: 17.0000 g | PACK | Freq: Two times a day (BID) | ORAL | Status: AC

## 2023-04-08 MED ORDER — PRAMIPEXOLE DIHYDROCHLORIDE 0.5 MG PO TABS: 0.5000 mg | ORAL_TABLET | Freq: Every day | ORAL | Status: AC

## 2023-04-08 MED ORDER — GABAPENTIN 100 MG PO CAPS: 100.0000 mg | ORAL_CAPSULE | Freq: Two times a day (BID) | ORAL | Status: AC

## 2023-04-08 MED ORDER — SENNA 8.6 MG PO TABS: 1.0000 | ORAL_TABLET | Freq: Two times a day (BID) | ORAL | Status: DC

## 2023-04-08 MED ORDER — METOPROLOL SUCCINATE ER 25 MG PO TB24: 75.0000 mg | ORAL_TABLET | Freq: Every day | ORAL | Status: DC

## 2023-04-08 MED ORDER — FUROSEMIDE 40 MG PO TABS: 40.0000 mg | ORAL_TABLET | Freq: Two times a day (BID) | ORAL | Status: DC

## 2023-04-08 MED ORDER — METOPROLOL SUCCINATE ER 50 MG PO TB24: 75.0000 mg | ORAL_TABLET | Freq: Every day | ORAL | Status: DC

## 2023-04-08 NOTE — Discharge Summary (Signed)
Family Medicine [facility] Discharge Summary  Patient name: Gina Bishop Medical record number: [id number] Date of birth: [birth year] Age: 89 y.o. Gender: female Date of Admission: 03/31/2023  Date of Discharge: 04/08/23  Admitting Physician: John Giovanni, MD  Primary Care Provider: Caro Laroche, DO Consultants: Cardiology, Palliative   Indication for Hospitalization: A fib with RVR  Discharge Diagnoses/Problem List:  Principal Problem for Admission: A fib with RVR  Other Problems addressed during stay:  Principal Problem:   Atrial fibrillation with RVR (HCC) Active Problems:   Hyponatremia   Congestive heart failure (CHF) (HCC)   CAD (coronary artery disease)   AKI (acute kidney injury) (HCC)   Acute on chronic combined systolic and diastolic CHF (congestive heart failure) [facility])    Brief Hospital Course:  Gina Bishop is a 89 y.o.female with a history of HTN, HLD, chronic pain who was admitted to the [facility] Teaching [facility] at [facility] for new onset A-fib with RVR. Her hospital course is detailed below:  New onset A-fib with RVR Patient presented with dyspnea and cough with heart rates initially in the 150s.  EKG demonstrated A-fib with RVR.  Patient was started on IV Dilt and transition to metoprolol for rate control.  Eliquis was started for anticoagulation. She declined further evaluation or cardioversion per cardiology.  Upon discharge, patient was in rate controlled A fib, was discharged on Eliquis 5 mg BID, metoprolol 75 mg daily.   Hyponatremia Presented with sodium of 121.  Urine studies were performed and was normal. AM cortisol was normal.  Hyponatremia suspected secondary to fluid overload versus SIADH.  Diuresed with IV Lasix and fluid restricted which improved hyponatremia. We do not expect hyponatremia to resolve due to heart function.   Pleural effusions CTPE demonstrated moderate bilateral pleural effusions.  Crackles are present on  exam.  Diuresed with IV Lasix.  Patient had no increased oxygen requirement or work of breathing.  Monitor symptomatically.  CHF CT PE demonstrated evidence of cardiomegaly and elevated right heart pressures, moderate bilateral pleural effusions.  BNP 1142 on admission.  Echo was repeated during this admission which revealed worsened EF to 20-25% from previous echo in 07/2021 (40 to 45%).  Also revealed, G2DD, severely decreased LV function, global hypokinesis, mild MVR, moderate aortic valve calcification, IVC greater than 50% respiratory variability.  Diuresed modestly well with IV Lasix.  Cardiology followed and recommended further diuresis which improved her status.  Pneumonia Initially patient did with URI symptoms of cough and congestion which have been ongoing for several weeks.  CXR revealed LLL opacity suspicious for PNA.  Patient was started on ceftriaxone and azithromycin.  White blood count down trended during admission, she remained afebrile throughout the admission, and no focal findings noted on exam.  Pro-Cal was negative therefore ceftriaxone and azithromycin were discontinued.  CAD CTA notable for extensive multivessel coronary artery calcification.  Lipid panel with LDL of 53.  Continue pravastatin 40 mg daily.  Hypomagnesemia Monitored with mag and repleted as indicated, with goal > 2.  Other chronic conditions were medically managed with home medications and formulary alternatives as necessary:  CAD: Continue home pravastatin RLS: Home pramipexole Hypertension: Holding home spironolactone and benazepril in the setting of hyperkalemia on admission, consider restarting at SNF after palliative discussion  PCP Follow-up Recommendations: Will need close follow up with cardiology for CHF and A fib Follow up BMP for hyponatremia and Mag levels  Continue palliative discussion outpatient    Disposition: SNF  Discharge Condition: Stable  Discharge Exam:  Vitals:   04/08/23  1133 04/08/23 1135  BP: 100/67 100/67  Pulse: 91 93  Resp: (!) 23   Temp: (!) 97.1 F (36.2 C)   SpO2: 97% 98%   General: elderly female, chronically ill appearing, NAD Cardiovascular: irregularly irregular with tachycardia, no m/r/g Respiratory: CTAB bilaterally  Abdomen: soft, NTND Extremities: moves all extremities spontaneously   Significant Procedures:  None  Significant Labs and Imaging:  No results for input(s): "WBC", "HGB", "HCT", "PLT" in the last 48 hours. Recent Labs  Lab 04/07/23 0318 04/08/23 0402  NA 124* 125*  K 4.2 4.4  CL 88* 85*  CO2 25 28  GLUCOSE 101* 99  BUN 43* 45*  CREATININE 1.22* 1.22*  CALCIUM 8.1* 8.3*  MG 2.1 2.0     Pertinent Imaging   CT Angion Chest PE 03/31/23: 1. No pulmonary embolism. 2. Cardiomegaly with elevated right heart pressures and at least some degree of right heart failure. 3. Moderate right and small left pleural effusions with associated bibasilar compressive atelectasis. 4. Extensive multi-vessel coronary artery calcification. 5. Subpleural sparse pulmonary infiltrate within the right upper lobe, may reflect an infectious or inflammatory infiltrate or post inflammatory scarring, but is indeterminate. Short-term follow-up imaging in 3 months is recommended to document stability or resolution.  Echo 04/01/23:  1. Compared with the echo in 2022, systolic function is worse. Left  ventricular ejection fraction, by estimation, is 20 to 25%. The left  ventricle has severely decreased function. The left ventricle demonstrates  global hypokinesis. Left ventricular  diastolic parameters are consistent with Grade II diastolic dysfunction  (pseudonormalization).   2. Right ventricular systolic function is normal. The right ventricular  size is normal. There is moderately elevated pulmonary artery systolic  pressure.   3. Large pleural effusion in the left lateral region.   4. The mitral valve is normal in structure. Mild  mitral valve  regurgitation. No evidence of mitral stenosis.   5. Tricuspid valve regurgitation is moderate to severe.   6. The aortic valve is tricuspid. There is moderate calcification of the  aortic valve. There is moderate thickening of the aortic valve. Aortic  valve regurgitation is mild. No aortic stenosis is present.   7. The inferior vena cava is dilated in size with >50% respiratory  variability, suggesting right atrial pressure of 8 mmHg.    Results/Tests Pending at Time of Discharge: None  Discharge Medications:  Allergies as of 04/08/2023       Reactions   Fish Allergy    Orange Juice [orange Oil]    Strawberry Extract    Vicodin [hydrocodone-acetaminophen] Other (See Comments)   hallucinatations and nightmares   Erythromycin Diarrhea   Amlodipine    Leg swelling   Fexofenadine    Sedated in the 180 mg dose.   Amoxicillin Nausea And Vomiting   Hydrochlorothiazide Other (See Comments)   Hyponatremia while on HCTZ        Medication List     PAUSE taking these medications    benazepril 40 MG tablet Wait to take this until your doctor or other care provider tells you to start again. Commonly known as: LOTENSIN TAKE 1 TABLET(40 MG) BY MOUTH AT BEDTIME   spironolactone 25 MG tablet Wait to take this until your doctor or other care provider tells you to start again. Commonly known as: ALDACTONE Take 1 tablet (25 mg total) by mouth daily.       STOP taking these medications    docusate  sodium 100 MG capsule Commonly known as: COLACE       TAKE these medications    albuterol 108 (90 Base) MCG/ACT inhaler Commonly known as: VENTOLIN HFA Inhale 2 puffs into the lungs every 6 (six) hours as needed for shortness of breath.   apixaban 5 MG Tabs tablet Commonly known as: ELIQUIS Take 1 tablet (5 mg total) by mouth 2 (two) times daily.   azelastine 0.1 % nasal spray Commonly known as: ASTELIN Place 1 spray into both nostrils 2 (two) times daily. Use  in each nostril as directed What changed:  when to take this reasons to take this   furosemide 40 MG tablet Commonly known as: LASIX Take 1 tablet (40 mg total) by mouth 2 (two) times daily.   gabapentin 100 MG capsule Commonly known as: NEURONTIN Take 1 capsule (100 mg total) by mouth 2 (two) times daily before lunch and supper.   metoprolol succinate 50 MG 24 hr tablet Commonly known as: Toprol XL Take 1.5 tablets (75 mg total) by mouth daily. Take with or immediately following a meal.   metoprolol succinate 25 MG 24 hr tablet Commonly known as: TOPROL-XL Take 3 tablets (75 mg total) by mouth daily.   polyethylene glycol 17 g packet Commonly known as: MIRALAX / GLYCOLAX Take 17 g by mouth 2 (two) times daily.   pramipexole 0.5 MG tablet Commonly known as: MIRAPEX Take 1 tablet (0.5 mg total) by mouth at bedtime. What changed: See the new instructions.   pravastatin 40 MG tablet Commonly known as: PRAVACHOL Take 1 tablet (40 mg total) by mouth at bedtime. What changed: when to take this   senna 8.6 MG Tabs tablet Commonly known as: SENOKOT Take 1 tablet (8.6 mg total) by mouth 2 (two) times daily.               Discharge Care Instructions  (From admission, onward)           Start     Ordered   04/08/23 0000  Discharge wound care:       Comments: Recommend pressure relief and foam dressing over the affected area   04/08/23 1130            Discharge Instructions: Please refer to Patient Instructions section of EMR for full details.  Patient was counseled important signs and symptoms that should prompt return to medical care, changes in medications, dietary instructions, activity restrictions, and follow up appointments.   Follow-Up Appointments:  Contact information for follow-up providers     Caro Laroche, DO. Schedule an appointment as soon as possible for a visit in 1 week(s).   Specialty: Family Medicine Contact information: [address] [place] [address] [phone]              Contact information for after-discharge care     Destination     [facility] .   Service: Skilled Nursing Contact information: [address] [place] [address] [phone]                     Penne Lash, MD 04/08/2023, 11:50 AM PGY-1, [facility] Family Medicine

## 2023-04-08 NOTE — Progress Notes (Signed)
Mobility Specialist Progress Note;   04/08/23 0910  Mobility  Activity Transferred from bed to chair  Level of Assistance Contact guard assist, steadying assist  Assistive Device Front wheel walker  Distance Ambulated (ft) 5 ft  Activity Response Tolerated well  Mobility Referral Yes  Mobility visit 1 Mobility  Mobility Specialist Start Time (ACUTE ONLY) 0910  Mobility Specialist Stop Time (ACUTE ONLY) 0920  Mobility Specialist Time Calculation (min) (ACUTE ONLY) 10 min   Pt agreeable to transfer from bed to chair. Required MinG assistance during ambulation for safety. VSS throughout and no c/o during transfer. Pt left comfortably in chair with all needs met, alarm on.   Caesar Bookman Mobility Specialist Please contact via SecureChat or [employer] [phone]

## 2023-04-08 NOTE — Assessment & Plan Note (Deleted)
Patient rate controlled on metoprolol XL 75 mg daily.  Palliative consulted to discuss care options with patient and family given poor prognosis.   - Cardiology consulted, appreciate recs -Palliative consult, appreciate recommendations -Transition from metoprolol tartate to 25 mg TID to metoprolol XL 75 mg daily - Eliquis 5 mg BID

## 2023-04-08 NOTE — TOC Progression Note (Signed)
Transition of Care [facility]) - Progression Note    Patient Details  Name: Gina Bishop MRN: [id number] Date of Birth: 1931-09-02  Transition of Care [facility]) CM/SW Contact  Delilah Shan, LCSWA Phone Number: 04/08/2023, 11:31 AM  Clinical Narrative:     CSW spoke with S[facility] with [facility] who confirmed patient can dc over today if medically ready. CSW informed MD. CSW will continue to follow and assist with patients dc planning needs.  Expected Discharge Plan: Skilled Nursing Facility Barriers to Discharge: Continued Medical Work up  Expected Discharge Plan and Services In-house Referral: Clinical Social Work     Living arrangements for the past 2 months: Assisted Living Facility Expected Discharge Date: 04/08/23                                     Social Determinants of Health (SDOH) Interventions SDOH Screenings   Food Insecurity: No Food Insecurity (04/01/2023)  Housing: Low Risk  (04/01/2023)  Transportation Needs: No Transportation Needs (04/01/2023)  Utilities: Not At Risk (04/01/2023)  Alcohol Screen: Low Risk  (04/27/2022)  Depression (PHQ2-9): Low Risk  (04/27/2022)  Financial Resource Strain: Low Risk  (04/27/2022)  Physical Activity: Sufficiently Active (04/27/2022)  Social Connections: Moderately Isolated (04/01/2023)  Stress: No Stress Concern Present (04/27/2022)  Tobacco Use: Low Risk  (03/31/2023)    Readmission Risk Interventions     No data to display

## 2023-04-08 NOTE — Assessment & Plan Note (Deleted)
Cr 1.22, mildly improved from prior - AM BMP

## 2023-04-08 NOTE — Assessment & Plan Note (Deleted)
Sodium improved to 125 today.  Do not expect that patient will recover from hyponatremia given her functional fluid status. - Will continue to follow with daily BMP - PO lasix 40 mg BID - Fluid restriction

## 2023-04-08 NOTE — Progress Notes (Signed)
Heart Failure Navigator Progress Note  Assessed for Heart & Vascular TOC clinic readiness.  Patient does not meet criteria due to - conservative care, palliative as outpatient. Dc to SNF - No HF TOC   Navigator will sign off at this time.   Rhae Hammock, BSN, Scientist, clinical (histocompatibility and immunogenetics) Only

## 2023-04-08 NOTE — Plan of Care (Signed)

## 2023-04-08 NOTE — TOC Transition Note (Signed)
Transition of Care [facility]) - Discharge Note   Patient Details  Name: Gina Bishop MRN: [id number] Date of Birth: 09-06-1931  Transition of Care [facility]) CM/SW Contact:  Delilah Shan, LCSWA Phone Number: 04/08/2023, 12:13 PM   Clinical Narrative:     [facility] will [facility] to: [facility]   Anticipated DC date: 04/08/2023  Family notified: Minerva Areola   Transport by: Sharin Mons   ?  Per MD patient ready for DC to [facility] . RN, patient, patient's family, and facility notified of DC. Discharge Summary sent to facility. RN given number for report [phone] RM# [id number] [facility]. DC packet on chart. DNR signed by MD attached to patients DC packet.Ambulance transport requested for patient.  CSW signing off.   Final next level of care: Skilled Nursing Facility Barriers to Discharge: No Barriers Identified   Patient Goals and CMS Choice Patient states their goals for this hospitalization and ongoing recovery are:: SNF   Choice offered to / list presented to : Patient, Adult Children      Discharge Placement              [facility] Patient to be transferred to facility by: [facility] Name of family member notified: Minerva Areola Patient and family notified of of transfer: 04/08/23  Discharge Plan and Services Additional resources added to the After Visit Summary for   In-house Referral: Clinical Social Work                                   Social Drivers of Health (SDOH) Interventions SDOH Screenings   Food Insecurity: No Food Insecurity (04/01/2023)  Housing: Low Risk  (04/01/2023)  Transportation Needs: No Transportation Needs (04/01/2023)  Utilities: Not At Risk (04/01/2023)  Alcohol Screen: Low Risk  (04/27/2022)  Depression (PHQ2-9): Low Risk  (04/27/2022)  Financial Resource Strain: Low Risk  (04/27/2022)  Physical Activity: Sufficiently Active (04/27/2022)  Social Connections: Moderately Isolated (04/01/2023)  Stress: No Stress Concern Present  (04/27/2022)  Tobacco Use: Low Risk  (03/31/2023)     Readmission Risk Interventions     No data to display

## 2023-04-08 NOTE — Progress Notes (Signed)
Report called to [facility] receiving Nurse Eldridge Dace, LPN. Questions answered and no concerns verbalized.  Patient and family at the bedside made aware of pending discharge to SNF. Provided with room # 810P [facility]. Verbalized understanding. Awaiting [facility] to transfer out.

## 2023-04-26 ENCOUNTER — Ambulatory Visit: Payer: Medicare Other | Attending: Emergency Medicine | Admitting: Emergency Medicine

## 2023-04-26 ENCOUNTER — Encounter: Payer: Self-pay | Admitting: Emergency Medicine

## 2023-04-26 VITALS — BP 120/70 | HR 88 | Ht 65.0 in | Wt 153.0 lb

## 2023-04-26 DIAGNOSIS — I5043 Acute on chronic combined systolic (congestive) and diastolic (congestive) heart failure: Secondary | ICD-10-CM | POA: Diagnosis present

## 2023-04-26 DIAGNOSIS — I350 Nonrheumatic aortic (valve) stenosis: Secondary | ICD-10-CM

## 2023-04-26 DIAGNOSIS — N179 Acute kidney failure, unspecified: Secondary | ICD-10-CM | POA: Diagnosis present

## 2023-04-26 DIAGNOSIS — I4891 Unspecified atrial fibrillation: Secondary | ICD-10-CM | POA: Diagnosis not present

## 2023-04-26 DIAGNOSIS — I2510 Atherosclerotic heart disease of native coronary artery without angina pectoris: Secondary | ICD-10-CM

## 2023-04-26 DIAGNOSIS — I340 Nonrheumatic mitral (valve) insufficiency: Secondary | ICD-10-CM

## 2023-04-26 MED ORDER — METOPROLOL SUCCINATE ER 100 MG PO TB24: 100.0000 mg | ORAL_TABLET | Freq: Every day | ORAL | 3 refills | Status: AC

## 2023-04-26 MED ORDER — SPIRONOLACTONE 25 MG PO TABS: 12.5000 mg | ORAL_TABLET | Freq: Every day | ORAL | 1 refills | Status: DC

## 2023-04-26 NOTE — Patient Instructions (Addendum)
 Medication Instructions:  INCREASE METOPROLOL TO 100 MG DAILY.    Lab Work: BMET   Testing/Procedures: Your physician has requested that you have an ECHOCARDIOGRAM. Echocardiography is a painless test that uses sound waves to create images of your heart. It provides your doctor with information about the size and shape of your heart and how well your heart's chambers and valves are working. This procedure takes approximately one hour. There are no restrictions for this procedure. Please do NOT wear cologne, perfume, aftershave, or lotions (deodorant is allowed). Please arrive 15 minutes prior to your appointment time.  Please note: We ask at that you not bring children with you during ultrasound (echo/ vascular) testing. Due to room size and safety concerns, children are not allowed in the ultrasound rooms during exams. Our front office staff cannot provide observation of children in our lobby area while testing is being conducted. An adult accompanying a patient to their appointment will only be allowed in the ultrasound room at the discretion of the ultrasound technician under special circumstances. We apologize for any inconvenience.    Follow-Up: At [facility], you and your health needs are our priority.  As part of our continuing mission to provide you with exceptional heart care, we have created designated Provider Care Teams.  These Care Teams include your primary Cardiologist (physician) and Advanced Practice Providers (APPs -  Physician Assistants and Nurse Practitioners) who all work together to provide you with the care you need, when you need it.   Your next appointment:   2 MONTHS  Provider:   Rise Paganini, DNP    Other Instructions:

## 2023-04-26 NOTE — Progress Notes (Addendum)
 Cardiology Office Note:    Date:  04/26/2023  ID:  Gina Bishop, DOB 17-Feb-1931, MRN [id number] PCP: Kandis Ormond, DO  [facility] Providers Cardiologist:  Euell Herrlich, MD       Patient Profile:      Chief Complaint: Hospital follow-up for atrial fibrillation and heart failure  History of Present Illness:  Gina Bishop is a 89 y.o. female with visit-pertinent history of hypertension, atrial fibrillation with RVR, hyperlipidemia coronary artery disease,  Patient presented to the hospital on 03/31/2023 with weakness, shortness of breath, cough and found to be in new onset atrial fibrillation with RVR for which cardiology was consulted.  She was found to be in new onset atrial fibrillation in the setting of recurrent URI with superimposed bacterial pneumonia.  Rates were initially in the 150s.  Echocardiogram showed LVEF of 20-25% with global hypokinesis, grade 2 diastolic dysfunction, moderately elevated PASP, severe RV dysfunction, mild RV enlargement, moderate to severe MR, moderate TR, probable moderate low-flow low gradient AS, severe LAE.  BNP markedly elevated at 1142.  Chest CTA showed cardiomegaly with elevated right heart pressure and at least some degree of right heart failure as well as moderate right and small left pleural effusion.  She was started on IV diltiazem and transition to metoprolol for rate control.  She was started on IV Lasix with net -1.7 L during admission.  She was transition to Lasix 40 mg twice daily, Eliquis 5 mg twice daily, and metoprolol XL 75 mg daily at discharge.  She refused TEE/DCCV and wishes for conservative management.  Her spironolactone was held at discharge given low blood pressure.   Discussed the use of AI scribe software for clinical note transcription with the patient, who gave verbal consent to proceed.  The patient, with a history of atrial fibrillation, heart failure, and pneumonia, presents for a follow-up visit after  hospitalization. She reports feeling 'fine now' and denies any current symptoms.  She notes that she 'feels great' and she was unaware of her atrial fibrillation while in the hospital.  She tells me her blood pressure has been under excellent control while at her rehab facility.  She notes that she no longer experiences any SOB and her congestion has improved.  She notes that her lower extremity swelling has vastly improved.  She is currently in a rehab facility, where she has been for 3-4 weeks, and is participating in various physical activities including indoor biking and walking.  She also does a lot of standing workouts.  She plans to return to her previous living facility, [name], soon.  She is able to workout without any exertional symptoms.  Today she denies any angina, dyspnea, palpitations, lower extremity swelling, hematochezia, melena, orthopnea, PND, DOE.    Review of systems:  Please see the history of present illness. All other systems are reviewed and otherwise negative.     Home Medications:    Current Meds  Medication Sig   albuterol (VENTOLIN HFA) 108 (90 Base) MCG/ACT inhaler Inhale 2 puffs into the lungs every 6 (six) hours as needed for shortness of breath.   apixaban (ELIQUIS) 5 MG TABS tablet Take 1 tablet (5 mg total) by mouth 2 (two) times daily.   azelastine (ASTELIN) 0.1 % nasal spray Place 1 spray into both nostrils 2 (two) times daily. Use in each nostril as directed (Patient taking differently: Place 1 spray into both nostrils daily as needed for allergies. Use in each nostril as directed)   [Paused]  benazepril (LOTENSIN) 40 MG tablet TAKE 1 TABLET(40 MG) BY MOUTH AT BEDTIME   furosemide (LASIX) 40 MG tablet Take 1 tablet (40 mg total) by mouth 2 (two) times daily.   gabapentin (NEURONTIN) 100 MG capsule Take 1 capsule (100 mg total) by mouth 2 (two) times daily before lunch and supper.   metoprolol succinate (TOPROL-XL) 100 MG 24 hr tablet Take 1 tablet (100 mg  total) by mouth daily. Take with or immediately following a meal.   polyethylene glycol (MIRALAX / GLYCOLAX) 17 g packet Take 17 g by mouth 2 (two) times daily.   pramipexole (MIRAPEX) 0.5 MG tablet Take 1 tablet (0.5 mg total) by mouth at bedtime.   pravastatin (PRAVACHOL) 40 MG tablet Take 1 tablet (40 mg total) by mouth at bedtime.   senna (SENOKOT) 8.6 MG TABS tablet Take 1 tablet (8.6 mg total) by mouth 2 (two) times daily.   [Paused] spironolactone (ALDACTONE) 25 MG tablet Take 1 tablet (25 mg total) by mouth daily.   [DISCONTINUED] metoprolol succinate (TOPROL XL) 50 MG 24 hr tablet Take 1.5 tablets (75 mg total) by mouth daily. Take with or immediately following a meal.   [DISCONTINUED] metoprolol succinate (TOPROL-XL) 25 MG 24 hr tablet Take 3 tablets (75 mg total) by mouth daily.   [DISCONTINUED] spironolactone (ALDACTONE) 25 MG tablet Take 0.5 tablets (12.5 mg total) by mouth daily.   Studies Reviewed:   EKG Interpretation Date/Time:  Tuesday April 26 2023 13:47:31 EDT Ventricular Rate:  110 PR Interval:    QRS Duration:  94 QT Interval:  344 QTC Calculation: 465 R Axis:   -15  Text Interpretation: Atrial fibrillation with rapid ventricular response with premature ventricular or aberrantly conducted complexes Nonspecific T wave abnormality When compared with ECG of 31-Mar-2023 18:16, PREVIOUS ECG IS PRESENT Confirmed by Palmer Bobo [phone]) on 04/26/2023 3:53:37 PM    Echocardiogram 04/01/2023 1. Compared with the echo in 2022, systolic function is worse. Left  ventricular ejection fraction, by estimation, is 20 to 25%. The left  ventricle has severely decreased function. The left ventricle demonstrates  global hypokinesis. Left ventricular  diastolic parameters are consistent with Grade II diastolic dysfunction  (pseudonormalization).   2. Right ventricular systolic function is normal. The right ventricular  size is normal. There is moderately elevated pulmonary artery  systolic  pressure.   3. Large pleural effusion in the left lateral region.   4. The mitral valve is normal in structure. Mild mitral valve  regurgitation. No evidence of mitral stenosis.   5. Tricuspid valve regurgitation is moderate to severe.   6. The aortic valve is tricuspid. There is moderate calcification of the  aortic valve. There is moderate thickening of the aortic valve. Aortic  valve regurgitation is mild. No aortic stenosis is present.   7. The inferior vena cava is dilated in size with >50% respiratory  variability, suggesting right atrial pressure of 8 mmHg.   Risk Assessment/Calculations:    CHA2DS2-VASc Score = 6   This indicates a 9.7% annual risk of stroke. The patient's score is based upon: CHF History: 1 HTN History: 1 Diabetes History: 0 Stroke History: 0 Vascular Disease History: 1 Age Score: 2 Gender Score: 1            Physical Exam:   VS:  BP 120/70 (BP Location: Left Arm, Patient Position: Sitting, Cuff Size: Normal)   Pulse 88   Ht 5\' 5"  (1.651 m)   Wt 153 lb (69.4 kg)   BMI 25.46  kg/m    Wt Readings from Last 3 Encounters:  04/26/23 153 lb (69.4 kg)  04/08/23 168 lb 14.4 oz (76.6 kg)  10/19/22 159 lb 6.4 oz (72.3 kg)    GEN: Well nourished, well developed in no acute distress NECK: No JVD; No carotid bruits CARDIAC: Irregularly irregular rhythm, no murmurs, rubs, gallops RESPIRATORY:  Clear to auscultation without rales, wheezing or rhonchi  ABDOMEN: Soft, non-tender, non-distended EXTREMITIES:  No edema; No acute deformity      Assessment and Plan:  Persistent atrial fibrillation Recent admission for new onset atrial fibrillation in the setting of recent URI with superimposed bacterial pneumonia.  Rates initially in the 150s.   She had refused TEE/DCCV during admission and wishes for conservative management of her medical conditions.  She was discharged home in rate controlled A-fib -EKG today shows persistent atrial fibrillation with  heart rate of 110 bpm -Overall patient is unaware of recurrent arrhythmia with no current acute complaints.  She is no longer SOB or experiencing any weakness or palpitations.  She is participating in daily activities at her rehab facility without anginal symptoms. -Overall heart rate today is 110 bpm and not currently under good control.  She does note she gets her vitals taken daily at her rehab facility and her heart rate has not been above 100 bpm.   -She again politely denied DCCV today -Plan today will be for better rate control, I will increase her metoprolol-XL from 75 mg daily to 100 mg daily -Continue Eliquis 5 mg twice daily.  No bleeding concerns and dosed adequately -Continue with conservative management   Acute combined CHF BNP 1142 on admission. Chest CTA showed cardiomegaly with elevated right heart pressure and at least some degree of right heart failure as well as moderate right and small left pleural effusion  Echo was repeated during this admission which revealed decreased LVEF of 20-25% from previous echo in 07/2021 (40 to 45%). Also revealed, G2DD, severely decreased LV function, global hypokinesis, mild MVR, moderate aortic valve calcification She was started on IV Lasix during admission and later transitioned to Lasix 40 mg twice daily with net -1.7L -Today she is euvolemic and well compensated on exam.  She is not volume overloaded today.  She is overall asymptomatic without any symptoms of overt heart failure.  Continues to workout daily at rehab facility -GDMT: Will continue Lasix 40 mg twice daily. Increase metoprolol-XL to 100 mg daily as noted above for better rate control and to optimize GDMT -Will continue to hold spironolactone (held during recent admission) at this time.  Will consider reintroducing MRA once she is better rate controlled. -BMET today  -Repeat echocardiogram x 2 months to reevaluate LV function  Moderate Low-Flow Low Gradient Aortic Stenosis Per Dr.  Miller Allis note on 04/03/2023: "Though gradients are low, stroke-volume index is low and DVI 0.38, might be lower given that the aortic valve signal was not measured at the highest value. Valve appears severely calcified and has moderate to severely reduced opening. " -Plan to continue to manage patient's CHF, she is not interested in any invasive workup or treatment at this time  Moderate to Severe Mitral Regurgitation MR read as mild on Echo report but Dr. Chancy Comber felt this was more moderate to severe. Likely secondary to severe left atrial enlargement and LV function -Continue with conservative management and treatment for heart failure  Acute kidney injury Her baseline creatinine is around 0.8.  Creatinine was 1.06 on admission and peaked at 1.45 on  04/06/2023 -BMET today  Coronary artery disease CTA 03/2023 notable for extensive multivessel coronary artery calcification.  Lipid panel 03/2023 with LDL of 53 -Continue pravastatin 40 mg daily           Dispo:  Return in about 2 months (around 06/26/2023).  Signed, Ava Boatman, NP

## 2023-04-27 LAB — BASIC METABOLIC PANEL
BUN/Creatinine Ratio: 25 (ref 12–28)
BUN: 37 mg/dL — ABNORMAL HIGH (ref 8–27)
CO2: 23 mmol/L (ref 20–29)
Calcium: 9.3 mg/dL (ref 8.7–10.3)
Chloride: 104 mmol/L (ref 96–106)
Creatinine, Ser: 1.49 mg/dL — ABNORMAL HIGH (ref 0.57–1.00)
Glucose: 118 mg/dL — ABNORMAL HIGH (ref 70–99)
Potassium: 3.6 mmol/L (ref 3.5–5.2)
Sodium: 144 mmol/L (ref 134–144)
eGFR: 33 mL/min/{1.73_m2} — ABNORMAL LOW (ref >59)

## 2023-04-29 ENCOUNTER — Other Ambulatory Visit: Payer: Self-pay | Admitting: *Deleted

## 2023-04-29 ENCOUNTER — Telehealth: Payer: Self-pay | Admitting: *Deleted

## 2023-04-29 DIAGNOSIS — I10 Essential (primary) hypertension: Secondary | ICD-10-CM

## 2023-04-29 DIAGNOSIS — I2510 Atherosclerotic heart disease of native coronary artery without angina pectoris: Secondary | ICD-10-CM

## 2023-04-29 DIAGNOSIS — I509 Heart failure, unspecified: Secondary | ICD-10-CM

## 2023-04-29 DIAGNOSIS — N179 Acute kidney failure, unspecified: Secondary | ICD-10-CM

## 2023-04-29 MED ORDER — FUROSEMIDE 40 MG PO TABS: 40.0000 mg | ORAL_TABLET | Freq: Every day | ORAL | 1 refills | Status: DC

## 2023-04-29 NOTE — Telephone Encounter (Signed)
 Spoke to patient about her lab results. She understood and had no questions. I let her know that [facility] wanted for her to come into the office in 3 weeks (around 05/20/23) for a repeat BMET. Orders for the labs has been put into [facility].

## 2023-05-19 ENCOUNTER — Ambulatory Visit: Payer: Medicare Other

## 2023-05-19 VITALS — Ht 65.0 in | Wt 155.0 lb

## 2023-05-19 DIAGNOSIS — Z0000 Encounter for general adult medical examination without abnormal findings: Secondary | ICD-10-CM

## 2023-05-19 NOTE — Progress Notes (Signed)
 Because this visit was a virtual/telehealth visit,  certain criteria was not obtained, such a blood pressure, CBG if applicable, and timed get up and go. Any medications not marked as "taking" were not mentioned during the medication reconciliation part of the visit. Any vitals not documented were not able to be obtained due to this being a telehealth visit or patient was unable to self-report a recent blood pressure reading due to a lack of equipment at home via telehealth. Vitals that have been documented are verbally provided by the patient.   Subjective:   Gina Bishop is a 89 y.o. who presents for a Medicare Wellness preventive visit.  Visit Complete: Virtual I connected with  Gina Bishop on 05/19/23 by a audio enabled telemedicine application and verified that I am speaking with the correct person using two identifiers.  Patient Location: Home  Provider Location: Office/Clinic  I discussed the limitations of evaluation and management by telemedicine. The patient expressed understanding and agreed to proceed.  Vital Signs: Because this visit was a virtual/telehealth visit, some criteria may be missing or patient reported. Any vitals not documented were not able to be obtained and vitals that have been documented are patient reported.  VideoDeclined- This patient declined Librarian, academic. Therefore the visit was completed with audio only.  Persons Participating in Visit: Patient.  AWV Questionnaire: No: Patient Medicare AWV questionnaire was not completed prior to this visit.  Cardiac Risk Factors include: advanced age (>2men, >50 women);dyslipidemia;family history of premature cardiovascular disease;hypertension     Objective:    Today's Vitals   05/19/23 1031  Weight: 155 lb (70.3 kg)  Height: 5\' 5"  (1.651 m)   Body mass index is 25.79 kg/m.     05/19/2023   10:33 AM 04/01/2023    1:18 PM 03/31/2023    6:21 PM 10/19/2022   11:06 AM 05/11/2022     1:50 PM 05/11/2022    9:26 AM 04/27/2022   11:17 AM  Advanced Directives  Does Patient Have a Medical Advance Directive? Yes Yes Yes Yes Yes Yes Yes  Type of Estate agent of [place];Living will Healthcare Power of [place];Living will Healthcare Power of [employer] of [place];Living will;Out of facility DNR (pink MOST or yellow form) Healthcare Power of [place];Living will;Out of facility DNR (pink MOST or yellow form) Healthcare Power of [place];Living will  Does patient want to make changes to medical advance directive?  Yes (Inpatient - patient defers changing a medical advance directive and declines information at this time)   No - Patient declined    Copy of Healthcare Power of Attorney in Chart? No - copy requested No - copy requested   No - copy requested No - copy requested No - copy requested    Current Medications (verified) Outpatient Encounter Medications as of 05/19/2023  Medication Sig   albuterol (VENTOLIN HFA) 108 (90 Base) MCG/ACT inhaler Inhale 2 puffs into the lungs every 6 (six) hours as needed for shortness of breath.   apixaban (ELIQUIS) 5 MG TABS tablet Take 1 tablet (5 mg total) by mouth 2 (two) times daily.   azelastine (ASTELIN) 0.1 % nasal spray Place 1 spray into both nostrils 2 (two) times daily. Use in each nostril as directed (Patient taking differently: Place 1 spray into both nostrils daily as needed for allergies. Use in each nostril as directed)   [Paused] benazepril (LOTENSIN) 40 MG tablet TAKE 1 TABLET(40 MG) BY MOUTH AT BEDTIME   furosemide (LASIX)  40 MG tablet Take 1 tablet (40 mg total) by mouth daily.   gabapentin (NEURONTIN) 100 MG capsule Take 1 capsule (100 mg total) by mouth 2 (two) times daily before lunch and supper.   metoprolol succinate (TOPROL-XL) 100 MG 24 hr tablet Take 1 tablet (100 mg total) by mouth daily. Take with or immediately following a meal.   polyethylene glycol (MIRALAX / GLYCOLAX) 17 g  packet Take 17 g by mouth 2 (two) times daily.   pramipexole (MIRAPEX) 0.5 MG tablet Take 1 tablet (0.5 mg total) by mouth at bedtime.   pravastatin (PRAVACHOL) 40 MG tablet Take 1 tablet (40 mg total) by mouth at bedtime.   senna (SENOKOT) 8.6 MG TABS tablet Take 1 tablet (8.6 mg total) by mouth 2 (two) times daily.   [Paused] spironolactone (ALDACTONE) 25 MG tablet Take 1 tablet (25 mg total) by mouth daily.   [DISCONTINUED] spironolactone (ALDACTONE) 25 MG tablet TAKE 1/2 TABLET(12.5 MG) BY MOUTH AT BEDTIME   No facility-administered encounter medications on file as of 05/19/2023.    Allergies (verified) Fish allergy, Orange juice [orange oil], Strawberry extract, Vicodin [hydrocodone-acetaminophen], Erythromycin, Amlodipine, Fexofenadine, Amoxicillin, and Hydrochlorothiazide   History: Past Medical History:  Diagnosis Date   Allergy    Arthritis    Blood transfusion    Hyperlipidemia    Hypertension    Hypomagnesemia 04/01/2023   Pleural effusion 04/01/2023   Pneumonia 04/01/2023   Scoliosis    Past Surgical History:  Procedure Laterality Date   ABDOMINAL HYSTERECTOMY     APPENDECTOMY     CESAREAN SECTION     INTRAMEDULLARY (IM) NAIL INTERTROCHANTERIC Left 05/11/2022   Procedure: INTRAMEDULLARY (IM) NAIL INTERTROCHANTERIC;  Surgeon: Jones Broom, MD;  Location: [facility];  Service: Orthopedics;  Laterality: Left;   JOINT REPLACEMENT     Family History  Problem Relation Age of Onset   Kidney disease Mother    Cancer Mother        Kidney   Heart disease Father    Cancer Brother        Colon   Diabetes Son        T1DM   Rosacea Son    Social History   Socioeconomic History   Marital status: Married    Spouse name: John   Number of children: 3   Years of education: 12   Highest education level: Not on file  Occupational History   Occupation: Retired- Banker  Tobacco Use   Smoking status: Never   Smokeless tobacco: Never  Vaping Use   Vaping status:  Never Used  Substance and Sexual Activity   Alcohol use: No   Drug use: No   Sexual activity: Not Currently  Other Topics Concern   Not on file  Social History Narrative   Health Care POA:    Emergency Contact: husband, Jonny Ruiz (c) [phone] or son, Minerva Areola (c) [phone]   End of Life Plan: Pt reports giving living will copy to Dr. Leveda Anna   Who lives with you: husband and son   Any pets: none   Diet: Pt have a varied diet of protein, starch, fruits and vegetables.   Exercise: Pt exercises at [facility] 5-6 times a week. Pt reports participating in chair yoga, low impact aerobic classes, and swimming.   Seatbelts: Pt reports wearing seatbelt when in vehicles.    Wynelle Link Exposure/Protection: Pt reports wearing sunscreen and hat when outside.   Hobbies: reading and walking at the beach  Current Social History 11/02/2016                                                                                                     Social Drivers of Health   Financial Resource Strain: Low Risk  (05/19/2023)   Overall Financial Resource Strain (CARDIA)    Difficulty of Paying Living Expenses: Not hard at all  Food Insecurity: No Food Insecurity (05/19/2023)   Hunger Vital Sign    Worried About Running Out of Food in the Last Year: Never true    Ran Out of Food in the Last Year: Never true  Transportation Needs: No Transportation Needs (05/19/2023)   PRAPARE - Administrator, Civil Service (Medical): No    Lack of Transportation (Non-Medical): No  Physical Activity: Sufficiently Active (05/19/2023)   Exercise Vital Sign    Days of Exercise per Week: 5 days    Minutes of Exercise per Session: 60 min  Stress: No Stress Concern Present (05/19/2023)   [employer] of Occupational Health - Occupational Stress Questionnaire    Feeling of Stress : Not at all  Social Connections: Moderately Isolated (05/19/2023)   Social Connection and Isolation Panel [NHANES]    Frequency of Communication with  Friends and Family: More than three times a week    Frequency of Social Gatherings with Friends and Family: More than three times a week    Attends Religious Services: Never    Database administrator or Organizations: Yes    Attends Engineer, structural: More than 4 times per year    Marital Status: Widowed    Tobacco Counseling Counseling given: Not Answered    Clinical Intake:  Pre-visit preparation completed: Yes  Pain : No/denies pain     BMI - recorded: 25.79 Nutritional Status: BMI 25 -29 Overweight Nutritional Risks: None Diabetes: No  Lab Results  Component Value Date   HGBA1C 5.2 06/28/2019   HGBA1C 5.4 10/11/2018     How often do you need to have someone help you when you read instructions, pamphlets, or other written materials from your doctor or pharmacy?: 1 - Never What is the last grade level you completed in school?: COLLEGE  Interpreter Needed?: No  Information entered by :: Terree Gaultney N. Marna Weniger, LPN.   Activities of Daily Living     05/19/2023   10:36 AM 04/01/2023    1:18 PM  In your present state of health, do you have any difficulty performing the following activities:  Hearing? 0 0  Vision? 0 0  Difficulty concentrating or making decisions? 0 1  Walking or climbing stairs? 0   Dressing or bathing? 0   Doing errands, shopping? 0 0  Preparing Food and eating ? N   Using the Toilet? N   In the past six months, have you accidently leaked urine? Y   Do you have problems with loss of bowel control? Y   Managing your Medications? N   Managing your Finances? N   Housekeeping or managing your Housekeeping? N     Patient Care Team:  Caro Laroche, DO as PCP - General (Family Medicine) Parke Poisson, MD as PCP - Cardiology (Cardiology) Clarise Cruz, MD (Dental General Practice) Arnetha Gula, Lucious Groves, MD as Referring Physician (Ophthalmology)  Indicate any recent Medical Services you may have received from other than [facility]  providers in the past year (date may be approximate).     Assessment:   This is a routine wellness examination for Gina Bishop.  Hearing/Vision screen Hearing Screening - Comments:: Denies hearing difficulties.  Vision Screening - Comments:: No rx eyeglasses - up to date with routine eye exams with Dr. Milinda Cave Radiochenko    Goals Addressed             This Visit's Progress    05/19/2023: Conitnue to maintain my health by staying independent and active.         Depression Screen     05/19/2023   10:35 AM 04/27/2022   11:13 AM 04/27/2022   11:12 AM 09/03/2021   10:30 AM 04/08/2021    3:42 PM 02/18/2021   11:07 AM 07/02/2020    1:55 PM  PHQ 2/9 Scores  PHQ - 2 Score 0 0 0 0 0 0 0  PHQ- 9 Score 0   0 0 2 0    Fall Risk     05/19/2023   10:34 AM 04/27/2022   11:16 AM 07/02/2020    1:56 PM 01/24/2020   11:48 AM 11/26/2019    8:53 AM  Fall Risk   Falls in the past year? 0 0 0 0 0  Number falls in past yr: 0 0 0 0   Injury with Fall? 0 0     Risk for fall due to : No Fall Risks No Fall Risks     Follow up Falls prevention discussed;Falls evaluation completed Falls prevention discussed  Falls evaluation completed     MEDICARE RISK AT HOME:  Medicare Risk at Home Any stairs in or around the home?: No If so, are there any without handrails?: No Home free of loose throw rugs in walkways, pet beds, electrical cords, etc?: Yes Adequate lighting in your home to reduce risk of falls?: Yes Life alert?: Yes Use of a cane, walker or w/c?: Yes Grab bars in the bathroom?: Yes Shower chair or bench in shower?: Yes Elevated toilet seat or a handicapped toilet?: Yes  TIMED UP AND GO:  Was the test performed?  No  Cognitive Function: 6CIT completed    05/19/2023   10:44 AM 08/07/2013   10:00 AM 12/23/2010   11:00 AM  MMSE - Mini Mental State Exam  Not completed: Unable to complete    Orientation to time  5 5  Orientation to Place  3 5  Registration  3 3  Attention/ Calculation  5 5   Recall  3 2  Language- name 2 objects  2 2  Language- repeat  1 1  Language- follow 3 step command  3 3  Language- read & follow direction  1 1  Write a sentence  1 1  Copy design  1 1  Total score  28 29        05/19/2023   10:37 AM 04/27/2022   11:17 AM  6CIT Screen  What Year? 0 points 0 points  What month? 0 points 0 points  What time? 0 points 0 points  Count back from 20 0 points 0 points  Months in reverse 2 points 0 points  Repeat phrase 0 points 0 points  Total Score 2 points 0 points    Immunizations Immunization History  Administered Date(s) Administered   Fluad Quad(high Dose 65+) 12/10/2021   Fluad Trivalent(High Dose 65+) 10/19/2022   Influenza Split 12/11/2010, 12/22/2011   Influenza,inj,Quad PF,6+ Mos 11/17/2012, 11/21/2013, 11/28/2014, 11/05/2015, 12/09/2016, 11/26/2019   Influenza-Unspecified 12/10/2020   Moderna Sars-Covid-2 Vaccination 04/05/2019, 04/30/2019   PFIZER Comirnaty(Gray Top)Covid-19 Tri-Sucrose Vaccine 01/08/2020, 08/19/2020   PPD Test 01/03/2019   Pneumococcal Conjugate-13 11/21/2013   Pneumococcal Polysaccharide-23 11/17/2012   Tdap 01/10/2013    Screening Tests Health Maintenance  Topic Date Due   Zoster Vaccines- Shingrix (1 of 2) Never done   COVID-19 Vaccine (5 - 2024-25 season) 10/17/2022   DTaP/Tdap/Td (2 - Td or Tdap) 01/11/2023   INFLUENZA VACCINE  09/16/2023   Medicare Annual Wellness (AWV)  05/18/2024   Pneumonia Vaccine 70+ Years old  Completed   DEXA SCAN  Completed   HPV VACCINES  Aged Out    Health Maintenance  Health Maintenance Due  Topic Date Due   Zoster Vaccines- Shingrix (1 of 2) Never done   COVID-19 Vaccine (5 - 2024-25 season) 10/17/2022   DTaP/Tdap/Td (2 - Td or Tdap) 01/11/2023   Health Maintenance Items Addressed: Yes, patient is due for Shingrix, Dtap and Covid vaccines.  Additional Screening:  Vision Screening: Recommended annual ophthalmology exams for early detection of glaucoma and other  disorders of the eye.  Dental Screening: Recommended annual dental exams for proper oral hygiene  Community Resource Referral / Chronic Care Management: CRR required this visit?  No   CCM required this visit?  No     Plan:     I have personally reviewed and noted the following in the patient's chart:   Medical and social history Use of alcohol, tobacco or illicit drugs  Current medications and supplements including opioid prescriptions. Patient is not currently taking opioid prescriptions. Functional ability and status Nutritional status Physical activity Advanced directives List of other physicians Hospitalizations, surgeries, and ER visits in previous 12 months Vitals Screenings to include cognitive, depression, and falls Referrals and appointments  In addition, I have reviewed and discussed with patient certain preventive protocols, quality metrics, and best practice recommendations. A written personalized care plan for preventive services as well as general preventive health recommendations were provided to patient.     Mickeal Needy, LPN   07/20/7844   After Visit Summary: (MyChart) Due to this being a telephonic visit, the after visit summary with patients personalized plan was offered to patient via MyChart   Notes: Nothing significant to report at this time.

## 2023-05-19 NOTE — Patient Instructions (Addendum)
 Gina Bishop , Thank you for taking time to come for your Medicare Wellness Visit. I appreciate your ongoing commitment to your health goals. Please review the following plan we discussed and let me know if I can assist you in the future.   Referrals/Orders/Follow-Ups/Clinician Recommendations: Keep maintaining your health by keeping your appointments with Dr. Ellwood Dense and any specialists that you may see.  Call us if you need anything.  Have a great year!!!!  This is a list of the screening recommended for you and due dates:  Health Maintenance  Topic Date Due   Zoster (Shingles) Vaccine (1 of 2) Never done   COVID-19 Vaccine (5 - 2024-25 season) 10/17/2022   DTaP/Tdap/Td vaccine (2 - Td or Tdap) 01/11/2023   Flu Shot  09/16/2023   Medicare Annual Wellness Visit  05/18/2024   Pneumonia Vaccine  Completed   DEXA scan (bone density measurement)  Completed   HPV Vaccine  Aged Out    Advanced directives: (Copy Requested) Please bring a copy of your health care power of attorney and living will to the office to be added to your chart at your convenience. You can mail to [facility] [address] [place], [place] [address] or email to [email or web address]  Next Medicare Annual Wellness Visit scheduled for next year: Yes

## 2023-05-30 ENCOUNTER — Ambulatory Visit (HOSPITAL_COMMUNITY)
Admission: RE | Admit: 2023-05-30 | Discharge: 2023-05-30 | Disposition: A | Discharge status: Home / Self Care | Source: Ambulatory Visit | Attending: Emergency Medicine | Admitting: Emergency Medicine

## 2023-05-30 DIAGNOSIS — I4891 Unspecified atrial fibrillation: Secondary | ICD-10-CM | POA: Insufficient documentation

## 2023-05-30 LAB — ECHOCARDIOGRAM LIMITED
AR max vel: 0.97 cm2
AV Area VTI: 1.01 cm2
AV Area mean vel: 0.96 cm2
AV Mean grad: 8 mmHg
AV Peak grad: 14.1 mmHg
Ao pk vel: 1.88 m/s
Calc EF: 41.4 %
MV M vel: 4.76 m/s
MV Peak grad: 90.6 mmHg
MV VTI: 0.23 cm2
P 1/2 time: 397 ms
Radius: 0.58 cm
S' Lateral: 3.73 cm
Single Plane A2C EF: 45.3 %
Single Plane A4C EF: 45.5 %

## 2023-06-27 ENCOUNTER — Ambulatory Visit: Attending: Emergency Medicine | Admitting: Emergency Medicine

## 2023-06-27 ENCOUNTER — Encounter: Payer: Self-pay | Admitting: Emergency Medicine

## 2023-06-27 VITALS — BP 106/70 | HR 95 | Ht 65.0 in | Wt 153.0 lb

## 2023-06-27 DIAGNOSIS — I4811 Longstanding persistent atrial fibrillation: Secondary | ICD-10-CM | POA: Insufficient documentation

## 2023-06-27 DIAGNOSIS — I340 Nonrheumatic mitral (valve) insufficiency: Secondary | ICD-10-CM | POA: Insufficient documentation

## 2023-06-27 DIAGNOSIS — I2510 Atherosclerotic heart disease of native coronary artery without angina pectoris: Secondary | ICD-10-CM | POA: Diagnosis present

## 2023-06-27 DIAGNOSIS — I5020 Unspecified systolic (congestive) heart failure: Secondary | ICD-10-CM | POA: Diagnosis present

## 2023-06-27 DIAGNOSIS — N179 Acute kidney failure, unspecified: Secondary | ICD-10-CM | POA: Diagnosis present

## 2023-06-27 DIAGNOSIS — I351 Nonrheumatic aortic (valve) insufficiency: Secondary | ICD-10-CM | POA: Insufficient documentation

## 2023-06-27 MED ORDER — LOSARTAN POTASSIUM 50 MG PO TABS: 50.0000 mg | ORAL_TABLET | Freq: Every day | ORAL | 3 refills | Status: AC

## 2023-06-27 NOTE — Progress Notes (Unsigned)
 Cardiology Office Note:    Date:  06/28/2023  ID:  Gina Bishop, DOB [birth year], MRN [id number] PCP: Kandis Ormond, DO  [facility] Providers Cardiologist:  Euell Herrlich, MD       Patient Profile:      Chief Complaint: 77-month follow-up for atrial fibrillation and CHF History of Present Illness:  Gina Bishop is a 89 y.o. female with visit-pertinent history of hypertension, atrial fibrillation with RVR, hyperlipidemia coronary artery disease,   Patient presented to the hospital on 03/31/2023 with weakness, shortness of breath, cough and found to be in new onset atrial fibrillation with RVR for which cardiology was consulted.  She was found to be in new onset atrial fibrillation in the setting of recurrent URI with superimposed bacterial pneumonia.  Rates were initially in the 150s.  Echocardiogram showed LVEF of 20-25% with global hypokinesis, grade 2 diastolic dysfunction, moderately elevated PASP, severe RV dysfunction, mild RV enlargement, moderate to severe MR, moderate TR, probable moderate low-flow low gradient AS, severe LAE.  BNP markedly elevated at 1142.  Chest CTA showed cardiomegaly with elevated right heart pressure and at least some degree of right heart failure as well as moderate right and small left pleural effusion.  She was started on IV diltiazem  and transition to metoprolol  for rate control.  She was started on IV Lasix  with net -1.7 L during admission.  She was transition to Lasix  40 mg twice daily, Eliquis  5 mg twice daily, and metoprolol  XL 75 mg daily at discharge.  She refused TEE/DCCV and wishes for conservative management.  Her spironolactone  was held at discharge given low blood pressure.  She was last seen in clinic on 04/26/2023.  She noted that she was doing extremely well.  She had no current complaints.  She is euvolemic.  She was currently in a rehab facility and participating in various physical activities including indoor biking and walking.  EKG in  office showed persistent atrial fibrillation with heart rate of 110 bpm.  She again politely declined DCCV.  Her Toprol -XL was increased to 75 to 100 mg daily.  Repeat echocardiogram on 05/30/2023 showed LVEF 30 to 35%, mild LVH, left atrial size mildly dilated, moderate MR, moderate TR, mild AR.    Discussed the use of AI scribe software for clinical note transcription with the patient, who gave verbal consent to proceed.  History of Present Illness Gina Bishop is an 89 year old female with atrial fibrillation who presents for follow-up.  She presents to office today with her son.  She notes she is no longer living in her rehabilitation center and back home at her assisted living facility.  Today she is without any acute cardiovascular concerns or complaints.  She notes that she feels well overall and remains cardiac unaware of her arrhythmia.  She continues to workout 4 days a week with her physical therapist.  She notes that she gets her vitals taken every time and her blood pressures seems to be low normal and heart rate consistently remains below 100 bpm.  She denies chest pain, dyspnea, orthopnea, paroxysmal nocturnal dyspnea, edema, or weight gain.   She notes she has been taking her Lasix  twice daily once in the morning and then once at night.  She has been taking her second dose of Lasix  before bed.  Of note she was to switch to Lasix  40 mg daily after elevated creatinine on blood work 04/2023 however she never changed.  She never did come in for her  repeat BMET.  Lasix  causes nocturia, affecting her sleep. She engages in regular physical activity, including walking and physical therapy, without issues.   Review of systems:  Please see the history of present illness. All other systems are reviewed and otherwise negative.     Home Medications:    Current Meds  Medication Sig   albuterol (VENTOLIN HFA) 108 (90 Base) MCG/ACT inhaler Inhale 2 puffs into the lungs every 6 (six) hours as needed  for shortness of breath.   apixaban  (ELIQUIS ) 5 MG TABS tablet Take 1 tablet (5 mg total) by mouth 2 (two) times daily.   azelastine  (ASTELIN ) 0.1 % nasal spray Place 1 spray into both nostrils 2 (two) times daily. Use in each nostril as directed (Patient taking differently: Place 1 spray into both nostrils daily as needed for allergies. Use in each nostril as directed)   furosemide  (LASIX ) 40 MG tablet Take 1 tablet (40 mg total) by mouth daily.   gabapentin  (NEURONTIN ) 100 MG capsule Take 1 capsule (100 mg total) by mouth 2 (two) times daily before lunch and supper.   losartan (COZAAR) 50 MG tablet Take 1 tablet (50 mg total) by mouth daily.   metoprolol  succinate (TOPROL -XL) 100 MG 24 hr tablet Take 1 tablet (100 mg total) by mouth daily. Take with or immediately following a meal.   polyethylene glycol (MIRALAX  / GLYCOLAX ) 17 g packet Take 17 g by mouth 2 (two) times daily.   pramipexole  (MIRAPEX ) 0.5 MG tablet Take 1 tablet (0.5 mg total) by mouth at bedtime.   pravastatin  (PRAVACHOL ) 40 MG tablet Take 1 tablet (40 mg total) by mouth at bedtime.   senna (SENOKOT) 8.6 MG TABS tablet Take 1 tablet (8.6 mg total) by mouth 2 (two) times daily.   [DISCONTINUED] benazepril  (LOTENSIN ) 40 MG tablet TAKE 1 TABLET(40 MG) BY MOUTH AT BEDTIME   [DISCONTINUED] spironolactone  (ALDACTONE ) 25 MG tablet Take 1 tablet (25 mg total) by mouth daily.     Studies Reviewed:   EKG Interpretation Date/Time:  Monday Jun 27 2023 13:26:35 EDT Ventricular Rate:  95 PR Interval:    QRS Duration:  96 QT Interval:  338 QTC Calculation: 424 R Axis:   -18  Text Interpretation: Atrial fibrillation Nonspecific ST and T wave abnormality When compared with ECG of 26-Apr-2023 13:47, No significant change was found Confirmed by Palmer Bobo [phone]) on 06/27/2023 9:09:37 PM    Echocardiogram Limited 05/30/2023 1. Left ventricular ejection fraction, by estimation, is 30 to 35%. The  left ventricle has moderately  decreased function. The left ventricle  demonstrates global hypokinesis. The left ventricular internal cavity size  was moderately dilated. There is mild   left ventricular hypertrophy of the basal-septal segment. Left  ventricular diastolic parameters are indeterminate.   2. Left atrial size was mildly dilated.   3. The mitral valve is degenerative. Moderate mitral valve regurgitation.   4. Tricuspid valve regurgitation is moderate.   5. The aortic valve is calcified. There is moderate calcification of the  aortic valve. There is moderate thickening of the aortic valve. Aortic  valve regurgitation is mild. Aortic valve sclerosis is present, with no  evidence of aortic valve stenosis.  Aortic regurgitation PHT measures 397 msec. Aortic valve mean gradient  measures 8.0 mmHg. Aortic valve Vmax measures 1.88 m/s.   6. There is normal pulmonary artery systolic pressure. The estimated  right ventricular systolic pressure is 23.4 mmHg.   Echocardiogram 04/01/2023 1. Compared with the echo in 2022, systolic function is  worse. Left  ventricular ejection fraction, by estimation, is 20 to 25%. The left  ventricle has severely decreased function. The left ventricle demonstrates  global hypokinesis. Left ventricular  diastolic parameters are consistent with Grade II diastolic dysfunction  (pseudonormalization).   2. Right ventricular systolic function is normal. The right ventricular  size is normal. There is moderately elevated pulmonary artery systolic  pressure.   3. Large pleural effusion in the left lateral region.   4. The mitral valve is normal in structure. Mild mitral valve  regurgitation. No evidence of mitral stenosis.   5. Tricuspid valve regurgitation is moderate to severe.   6. The aortic valve is tricuspid. There is moderate calcification of the  aortic valve. There is moderate thickening of the aortic valve. Aortic  valve regurgitation is mild. No aortic stenosis is present.    7. The inferior vena cava is dilated in size with >50% respiratory  variability, suggesting right atrial pressure of 8 mmHg.   Risk Assessment/Calculations:    CHA2DS2-VASc Score = 6   This indicates a 9.7% annual risk of stroke. The patient's score is based upon: CHF History: 1 HTN History: 1 Diabetes History: 0 Stroke History: 0 Vascular Disease History: 1 Age Score: 2 Gender Score: 1            Physical Exam:   VS:  BP 106/70   Pulse 95   Ht 5\' 5"  (1.651 m)   Wt 153 lb (69.4 kg)   SpO2 99%   BMI 25.46 kg/m    Wt Readings from Last 3 Encounters:  06/27/23 153 lb (69.4 kg)  05/19/23 155 lb (70.3 kg)  04/26/23 153 lb (69.4 kg)    GEN: Well nourished, well developed in no acute distress NECK: No JVD; No carotid bruits CARDIAC: Irregularly irregular rhythm, no murmurs, rubs, gallops RESPIRATORY:  Clear to auscultation without rales, wheezing or rhonchi  ABDOMEN: Soft, non-tender, non-distended EXTREMITIES:  No edema; No acute deformity     Assessment and Plan:  Persistent atrial fibrillation Admitted 03/2023 with new onset atrial fibrillation in the setting of URI with bacterial pneumonia. She had refused TEE/DCCV at that time and asked for conservative management of all her medical conditions - She remains cardiac unaware of her arrhythmia.  Participates in physical therapy several times weekly without limitation - EKG today shows rate controlled A-fib with HR 95 bpm - She remains rate controlled without symptoms concerning for RVR.  Will continue current medication regimen - Continue Eliquis  5 mg twice daily.  No bleeding concerns and dosed adequately (89 yo, 69.4 kg, creatinine 1.49) - Continue with conservative management  - BMET today   HFrEF Echocardiogram 03/2023 in the setting of A-fib with RVR showed LVEF 20 to 25%, global hypokinesis, grade 2 DD Repeat echocardiogram 4/25 showed LVEF 30 to 35% - Today she is euvolemic and well compensated on exam.  NYHA class  I.  Not volume overloaded on exam.  Continues to participate in physical therapy without limitation - Her PTA spironolactone  25 mg was held during recent admission due to hypotension/AKI - Today I will transition her benazepril  to losartan 50 mg daily in order to optimize her GDMT - Educated that she should be taking Lasix  40 mg once daily in the morning - Further GDMT titration limited at this time given low normal BP  - Current GDMT: Metoprolol  XL 100 mg daily, losartan 50 mg daily, Lasix  40 mg daily - Will continue with conservative management   Moderate Low-Flow  Low Gradient Aortic Stenosis Per Dr. Miller Allis note on 04/03/2023: "Though gradients are low, stroke-volume index is low and DVI 0.38, might be lower given that the aortic valve signal was not measured at the highest value. Valve appears severely calcified and has moderate to severely reduced opening. " - Plan to continue to manage patient's CHF, she is not interested in any invasive workup or treatment at this time   Mitral regurgitation Tricuspid regurgitation Aortic regurgitation Echocardiogram 05/2023 shows moderate mitral valve regurgitation, moderate tricuspid valve regurgitation, and mild aortic valve regurgitation. - Will continue to clinically monitor   Acute kidney injury Creatinine 1.49 on 04/2023.  Creatinine was 1.06 on admission and peaked at 1.45. Of note she was to decrease her Lasix  from twice daily to once daily on 04/2023, however she has been continuing to take it twice daily - BMET today   Coronary artery disease CTA 03/2023 notable for extensive multivessel coronary artery calcification.  Lipid panel 03/2023 with LDL of 53 - Continue pravastatin  40 mg daily     Dispo:  Return in about 3 months (around 09/27/2023).  Signed, Ava Boatman, NP

## 2023-06-27 NOTE — Progress Notes (Incomplete)
 Cardiology Office Note:    Date:  06/27/2023  ID:  Gina Bishop, DOB 1932-01-04, MRN [id number] PCP: Kandis Ormond, DO  [facility] Providers Cardiologist:  Euell Herrlich, MD { Click to update primary MD,subspecialty MD or APP then REFRESH:1}    {Click to Open Review  :1}   Patient Profile:      Chief Complaint: 32-month follow-up for atrial fibrillation and CHF History of Present Illness:  Gina Bishop is a 89 y.o. female with visit-pertinent history of hypertension, atrial fibrillation with RVR, hyperlipidemia coronary artery disease,   Patient presented to the hospital on 03/31/2023 with weakness, shortness of breath, cough and found to be in new onset atrial fibrillation with RVR for which cardiology was consulted.  She was found to be in new onset atrial fibrillation in the setting of recurrent URI with superimposed bacterial pneumonia.  Rates were initially in the 150s.  Echocardiogram showed LVEF of 20-25% with global hypokinesis, grade 2 diastolic dysfunction, moderately elevated PASP, severe RV dysfunction, mild RV enlargement, moderate to severe MR, moderate TR, probable moderate low-flow low gradient AS, severe LAE.  BNP markedly elevated at 1142.  Chest CTA showed cardiomegaly with elevated right heart pressure and at least some degree of right heart failure as well as moderate right and small left pleural effusion.  She was started on IV diltiazem  and transition to metoprolol  for rate control.  She was started on IV Lasix  with net -1.7 L during admission.  She was transition to Lasix  40 mg twice daily, Eliquis  5 mg twice daily, and metoprolol  XL 75 mg daily at discharge.  She refused TEE/DCCV and wishes for conservative management.  Her spironolactone  was held at discharge given low blood pressure.  She was last seen in clinic on 04/26/2023.  She noted that she was doing extremely well.  She had no current complaints.  She is euvolemic.  She was currently in a rehab  facility and participating in various physical activities including indoor biking and walking.  EKG in office showed persistent atrial fibrillation with heart rate of 110 bpm.  She again politely declined DCCV.  Her Toprol -XL was increased to 75 to 100 mg daily.  Repeat echocardiogram on 05/30/2023 showed LVEF 30 to 35%, mild LVH, left atrial size mildly dilated, moderate MR, moderate TR, mild AR.    Discussed the use of AI scribe software for clinical note transcription with the patient, who gave verbal consent to proceed.  History of Present Illness Gina Bishop is an 89 year old female with atrial fibrillation who presents for follow-up.  She presents to office today with her son.  She notes she is no longer living in her rehabilitation center and back home at her assisted living facility.  Today she is without any acute cardiovascular concerns or complaints.  She notes that she feels well overall and remains cardiac unaware of her arrhythmia.  She continues to workout 4 days a week with her physical therapist.  She notes that she gets her vitals taken every time and her blood pressures seem to be low normal and heart rate consistently remains below 100 bpm.  She denies chest pain, dyspnea, orthopnea, paroxysmal nocturnal dyspnea, edema, or weight gain.   She notes she has been taking her Lasix  twice daily once in the morning and then once at night.  She has been taking her second dose of Lasix  before bed.  Of note she was to switch to Lasix  40 mg daily after elevated creatinine  on blood work 04/2023 however she never changed.  She never did come in for her repeat BMET.  Lasix  causes nocturia, affecting her sleep. She engages in regular physical activity, including walking and physical therapy, without issues.   Review of systems:  Please see the history of present illness. All other systems are reviewed and otherwise negative.     Home Medications:    Current Meds  Medication Sig  . albuterol  (VENTOLIN HFA) 108 (90 Base) MCG/ACT inhaler Inhale 2 puffs into the lungs every 6 (six) hours as needed for shortness of breath.  . apixaban  (ELIQUIS ) 5 MG TABS tablet Take 1 tablet (5 mg total) by mouth 2 (two) times daily.  . azelastine  (ASTELIN ) 0.1 % nasal spray Place 1 spray into both nostrils 2 (two) times daily. Use in each nostril as directed (Patient taking differently: Place 1 spray into both nostrils daily as needed for allergies. Use in each nostril as directed)  . furosemide  (LASIX ) 40 MG tablet Take 1 tablet (40 mg total) by mouth daily.  . gabapentin  (NEURONTIN ) 100 MG capsule Take 1 capsule (100 mg total) by mouth 2 (two) times daily before lunch and supper.  . losartan (COZAAR) 50 MG tablet Take 1 tablet (50 mg total) by mouth daily.  . metoprolol  succinate (TOPROL -XL) 100 MG 24 hr tablet Take 1 tablet (100 mg total) by mouth daily. Take with or immediately following a meal.  . polyethylene glycol (MIRALAX  / GLYCOLAX ) 17 g packet Take 17 g by mouth 2 (two) times daily.  . pramipexole  (MIRAPEX ) 0.5 MG tablet Take 1 tablet (0.5 mg total) by mouth at bedtime.  . pravastatin  (PRAVACHOL ) 40 MG tablet Take 1 tablet (40 mg total) by mouth at bedtime.  . senna (SENOKOT) 8.6 MG TABS tablet Take 1 tablet (8.6 mg total) by mouth 2 (two) times daily.  . [DISCONTINUED] benazepril  (LOTENSIN ) 40 MG tablet TAKE 1 TABLET(40 MG) BY MOUTH AT BEDTIME  . [DISCONTINUED] spironolactone  (ALDACTONE ) 25 MG tablet Take 1 tablet (25 mg total) by mouth daily.   Studies Reviewed:   EKG Interpretation Date/Time:  Monday Jun 27 2023 13:26:35 EDT Ventricular Rate:  95 PR Interval:    QRS Duration:  96 QT Interval:  338 QTC Calculation: 424 R Axis:   -18  Text Interpretation: Atrial fibrillation Nonspecific ST and T wave abnormality When compared with ECG of 26-Apr-2023 13:47, No significant change was found Confirmed by Palmer Bobo [id number]) on 06/27/2023 9:09:37 PM    Echocardiogram Limited  05/30/2023 1. Left ventricular ejection fraction, by estimation, is 30 to 35%. The  left ventricle has moderately decreased function. The left ventricle  demonstrates global hypokinesis. The left ventricular internal cavity size  was moderately dilated. There is mild   left ventricular hypertrophy of the basal-septal segment. Left  ventricular diastolic parameters are indeterminate.   2. Left atrial size was mildly dilated.   3. The mitral valve is degenerative. Moderate mitral valve regurgitation.   4. Tricuspid valve regurgitation is moderate.   5. The aortic valve is calcified. There is moderate calcification of the  aortic valve. There is moderate thickening of the aortic valve. Aortic  valve regurgitation is mild. Aortic valve sclerosis is present, with no  evidence of aortic valve stenosis.  Aortic regurgitation PHT measures 397 msec. Aortic valve mean gradient  measures 8.0 mmHg. Aortic valve Vmax measures 1.88 m/s.   6. There is normal pulmonary artery systolic pressure. The estimated  right ventricular systolic pressure is 23.4 mmHg.  Echocardiogram 04/01/2023 1. Compared with the echo in 2022, systolic function is worse. Left  ventricular ejection fraction, by estimation, is 20 to 25%. The left  ventricle has severely decreased function. The left ventricle demonstrates  global hypokinesis. Left ventricular  diastolic parameters are consistent with Grade II diastolic dysfunction  (pseudonormalization).   2. Right ventricular systolic function is normal. The right ventricular  size is normal. There is moderately elevated pulmonary artery systolic  pressure.   3. Large pleural effusion in the left lateral region.   4. The mitral valve is normal in structure. Mild mitral valve  regurgitation. No evidence of mitral stenosis.   5. Tricuspid valve regurgitation is moderate to severe.   6. The aortic valve is tricuspid. There is moderate calcification of the  aortic valve. There is  moderate thickening of the aortic valve. Aortic  valve regurgitation is mild. No aortic stenosis is present.   7. The inferior vena cava is dilated in size with >50% respiratory  variability, suggesting right atrial pressure of 8 mmHg.   Risk Assessment/Calculations:    CHA2DS2-VASc Score = 6  {Confirm score is correct.  If not, click here to update score.  REFRESH note.  :1} This indicates a 9.7% annual risk of stroke. The patient's score is based upon: CHF History: 1 HTN History: 1 Diabetes History: 0 Stroke History: 0 Vascular Disease History: 1 Age Score: 2 Gender Score: 1   {This patient has a significant risk of stroke if diagnosed with atrial fibrillation.  Please consider VKA or DOAC agent for anticoagulation if the bleeding risk is acceptable.   You can also use the SmartPhrase .[email or web address] for documentation.   :[id number]}         Physical Exam:   VS:  BP 106/70   Pulse 95   Ht 5\' 5"  (1.651 m)   Wt 153 lb (69.4 kg)   SpO2 99%   BMI 25.46 kg/m    Wt Readings from Last 3 Encounters:  06/27/23 153 lb (69.4 kg)  05/19/23 155 lb (70.3 kg)  04/26/23 153 lb (69.4 kg)    GEN: Well nourished, well developed in no acute distress NECK: No JVD; No carotid bruits CARDIAC: Irregularly irregular rhythm, no murmurs, rubs, gallops RESPIRATORY:  Clear to auscultation without rales, wheezing or rhonchi  ABDOMEN: Soft, non-tender, non-distended EXTREMITIES:  No edema; No acute deformity     Assessment and Plan:  Persistent atrial fibrillation Admitted 03/2023 with new onset atrial fibrillation in the setting of URI with bacterial pneumonia. She had refused TEE/DCCV and wishes for conservative management of all her medical conditions. - She remains cardiac unaware of her arrhythmia.  Participates in physical therapy several times weekly without limitation - EKG today shows rate controlled A-fib with HR 95 bpm - She remains rate controlled without symptoms concerning for RVR.  Will  continue current medication regimen - Continue Eliquis  5 mg twice daily.  No bleeding concerns and dosed adequately (89 yo, 69.4 kg, creatinine 1.49) - Continue with conservative management  - BMET today   HFrEF Echocardiogram 03/2023 in the setting of A-fib with RVR showed LV 20 to 25%, global hypokinesis, grade 2 DD Repeat echocardiogram 4/25 showed LVEF 30 to 35% - Today she is euvolemic and well compensated on exam.  NYHA class I.  Not volume overloaded on exam.  Continues participate in physical therapy without limitation - Her PTA spironolactone  25 mg was held during recent admission due to hypotension/AKI - Will transition her benazepril  to losartan  50 mg daily in order to optimize her GDMT today - Will transition her Lasix  40 mg twice daily to Lasix  40 mg once daily - Further GDMT titration limited at this time given low normal BP  - Current GDMT: Metoprolol  XL 100 mg daily, losartan 50 mg daily, Lasix  40 mg daily - Will continue with conservative management   Moderate Low-Flow Low Gradient Aortic Stenosis Per Dr. Miller Allis note on 04/03/2023: "Though gradients are low, stroke-volume index is low and DVI 0.38, might be lower given that the aortic valve signal was not measured at the highest value. Valve appears severely calcified and has moderate to severely reduced opening. " - Plan to continue to manage patient's CHF, she is not interested in any invasive workup or treatment at this time   Mitral regurgitation Tricuspid regurgitation Aortic regurgitation Echocardiogram 05/2023 shows moderate mitral valve regurgitation, moderate tricuspid valve regurgitation, mild aortic valve regurgitation. - Will continue to clinically monitor, she is not interested in any invasive workup or treatment   Acute kidney injury Creatinine 1.49 on 04/2023.  Creatinine was 1.06 on admission and peaked at 1.45. Of note she was to decrease her Lasix  from twice daily to once daily on 04/2023 however she has  been continuing taking it twice daily - BMET today   Coronary artery disease CTA 03/2023 notable for extensive multivessel coronary artery calcification.  Lipid panel 03/2023 with LDL of 53 - Continue pravastatin  40 mg daily     Dispo:  Return in about 3 months (around 09/27/2023).  Signed, Ava Boatman, NP

## 2023-06-27 NOTE — Patient Instructions (Addendum)
 Medication Instructions:  STOP TAKING BENAZEPRIL . START LOSARTAN 50 MG DAILY.  Lab Work: BMET  Testing/Procedures: NONE  Follow-Up: At [facility], you and your health needs are our priority.  As part of our continuing mission to provide you with exceptional heart care, our providers are all part of one team.  This team includes your primary Cardiologist (physician) and Advanced Practice Providers or APPs (Physician Assistants and Nurse Practitioners) who all work together to provide you with the care you need, when you need it.  Your next appointment:   3 month(s)  Provider:   MADISON FOUNTAIN, DNP

## 2023-06-28 ENCOUNTER — Encounter: Payer: Self-pay | Admitting: Emergency Medicine

## 2023-06-29 ENCOUNTER — Ambulatory Visit: Payer: Self-pay | Admitting: Emergency Medicine

## 2023-06-29 DIAGNOSIS — I10 Essential (primary) hypertension: Secondary | ICD-10-CM

## 2023-06-29 DIAGNOSIS — I4891 Unspecified atrial fibrillation: Secondary | ICD-10-CM

## 2023-06-29 DIAGNOSIS — N179 Acute kidney failure, unspecified: Secondary | ICD-10-CM

## 2023-06-29 LAB — BASIC METABOLIC PANEL WITH GFR
BUN/Creatinine Ratio: 37 — ABNORMAL HIGH (ref 12–28)
BUN: 59 mg/dL — ABNORMAL HIGH (ref 8–27)
CO2: 18 mmol/L — ABNORMAL LOW (ref 20–29)
Calcium: 9.1 mg/dL (ref 8.7–10.3)
Chloride: 98 mmol/L (ref 96–106)
Creatinine, Ser: 1.6 mg/dL — ABNORMAL HIGH (ref 0.57–1.00)
Glucose: 88 mg/dL (ref 70–99)
Potassium: 4.6 mmol/L (ref 3.5–5.2)
Sodium: 139 mmol/L (ref 134–144)
eGFR: 31 mL/min/{1.73_m2} — ABNORMAL LOW (ref >59)

## 2023-07-01 MED ORDER — FUROSEMIDE 40 MG PO TABS: ORAL_TABLET | ORAL | Status: DC

## 2023-07-06 ENCOUNTER — Telehealth: Payer: Self-pay | Admitting: Emergency Medicine

## 2023-07-06 NOTE — Telephone Encounter (Signed)
  Pt c/o medication issue:  1. Name of Medication: furosemide  (LASIX ) 40 MG tablet   2. How are you currently taking this medication (dosage and times per day)?   3. Are you having a reaction (difficulty breathing--STAT)? No   4. What is your medication issue? The patient's son, Lyell Samuel, called. He said she doesn't want her mom to completely stop taking Lasix . He asked if the patient can take one tablet a day until she goes for the repeat BMET.

## 2023-07-06 NOTE — Telephone Encounter (Signed)
 Left message for son to call back (OK to speak with him per [facility]). Pt was instructed to decrease Furosemide  to 40 mg for 3 lb wt gain overnight or 5 lbs in 1 week d/t elevated BUN/Crea on 06/27/23 lab.

## 2023-07-08 NOTE — Telephone Encounter (Signed)
Patient is returning call to a nurse

## 2023-07-08 NOTE — Telephone Encounter (Signed)
 Pt son called in returning call. He asked if someone can send him information via mychart for him to view there.

## 2023-07-08 NOTE — Telephone Encounter (Signed)
 Returned call to patient. She is requesting a 90 day refill on her Eliquis  5 mg BID, and also wanted to confirm her dose of Lasix  as 40 mg as needed.  Eliquis  filled by a different provider previously, will forward to Palmer Bobo, NP to see if OK to fill. Patient requests refill be sent to [facility] on [address].

## 2023-07-08 NOTE — Telephone Encounter (Signed)
 Attempted to return call to son Lyell Samuel on DPR-went straight to voicemail. Left another message asking him to call back and review medications for his mother. Tried primary number on chart as well, no answer and left message to call office.

## 2023-07-14 ENCOUNTER — Telehealth: Payer: Self-pay | Admitting: Emergency Medicine

## 2023-07-14 NOTE — Telephone Encounter (Signed)
 Pt c/o swelling: STAT is pt has developed SOB within 24 hours  How much weight have you gained and in what time span?  2 lbs in 2 days   If swelling, where is the swelling located?  No   Are you currently taking a fluid pill?  No   Are you currently SOB?  No   Do you have a log of your daily weights (if so, list)?  5/27: 160 5/29: 162.4 lbs  Have you gained 3 pounds in a day or 5 pounds in a week?    Have you traveled recently?  No

## 2023-07-14 NOTE — Telephone Encounter (Signed)
 Pt advised to take Lasix  in the morning per provider. Pt had follow up blood work today.

## 2023-07-14 NOTE — Telephone Encounter (Signed)
 From her recent BMET: have her start Lasix  40 mg as needed for swelling or weight gain of 3 pounds in 1 day or 5 pounds in 1 week.  This would allow for her kidney function to improve.  Make sure she continues to weigh herself daily.    Pt says she is weight daily at the same time, same clothes after waking up and after using the bathroom:  5/27: 160 5/29: 162.4 lbs  She is not having any SOB and no edema.Aaron Aas she will continue to monitor and will let us  know tomorrow since she feels good having the support and we will help guide her if she needs to go ahead and take the lasix .   I advised her that since she has been drinking more fluids and just stopped her daily lasix .. she may be seeing the bump up but she should keep an eye on it and she agrees.

## 2023-07-15 LAB — BASIC METABOLIC PANEL WITH GFR
BUN/Creatinine Ratio: 27 (ref 12–28)
BUN: 31 mg/dL — ABNORMAL HIGH (ref 8–27)
CO2: 18 mmol/L — ABNORMAL LOW (ref 20–29)
Calcium: 8.7 mg/dL (ref 8.7–10.3)
Chloride: 100 mmol/L (ref 96–106)
Creatinine, Ser: 1.14 mg/dL — ABNORMAL HIGH (ref 0.57–1.00)
Glucose: 112 mg/dL — ABNORMAL HIGH (ref 70–99)
Potassium: 4.9 mmol/L (ref 3.5–5.2)
Sodium: 136 mmol/L (ref 134–144)
eGFR: 46 mL/min/{1.73_m2} — ABNORMAL LOW (ref >59)

## 2023-07-18 ENCOUNTER — Ambulatory Visit: Payer: Self-pay | Admitting: Emergency Medicine

## 2023-07-18 MED ORDER — APIXABAN 5 MG PO TABS: 5.0000 mg | ORAL_TABLET | Freq: Two times a day (BID) | ORAL | 3 refills | Status: AC

## 2023-07-18 NOTE — Addendum Note (Signed)
 Addended by: Luwana Salvo on: 07/18/2023 01:13 PM   Modules accepted: Orders

## 2023-07-18 NOTE — Telephone Encounter (Signed)
 Refill for Eliquis  sent to [facility] pharmacy.

## 2023-07-19 ENCOUNTER — Telehealth: Payer: Self-pay | Admitting: Emergency Medicine

## 2023-07-19 NOTE — Telephone Encounter (Signed)
 Pt c/o swelling/edema: STAT if pt has developed SOB within 24 hours  If swelling, where is the swelling located? Little swelling in her legs  How much weight have you gained and in what time span? 5 lbs in 1 week  Have you gained 2 pounds in a day or 5 pounds in a week? 5 lbs in a week  Do you have a log of your daily weights (if so, list)?   Are you currently taking a fluid pill? yes  Are you currently SOB? no  Have you traveled recently in a car or    plane for an extended period of time?   Son said he have been waiting to hear something. He will be with patient until someone calls.

## 2023-07-19 NOTE — Telephone Encounter (Signed)
 Hey there, I saw your note on pt- looks like no one had followed up with her- here is an update

## 2023-08-12 NOTE — Telephone Encounter (Signed)
 Please schedule patient to see me or Dr. Acharya within the next two weeks thanks.

## 2023-08-17 ENCOUNTER — Telehealth: Payer: Self-pay | Admitting: Internal Medicine

## 2023-08-17 NOTE — Telephone Encounter (Signed)
 Left message to call office

## 2023-08-17 NOTE — Telephone Encounter (Signed)
  Per mychart scheduling message:  Pt c/o swelling/edema: STAT if pt has developed SOB within 24 hours  If swelling, where is the swelling located?   How much weight have you gained and in what time span?   Have you gained 2 pounds in a day or 5 pounds in a week?   Do you have a log of your daily weights (if so, list)?   Are you currently taking a fluid pill?   Are you currently SOB?   Have you traveled recently in a car or plane for an extended period of time?    Swelling in feet and legs. Weight gain.SABRAsince June 1st 9lbs.she has a daily weight log. She is taking 40mg . Lasix  in morning and 20mg .  at night. Shortness of breath has increased. No long travel or trips. Thanks.

## 2023-08-18 NOTE — Telephone Encounter (Signed)
 I called the patient.  She said she didn't need anything.  She has her appointment with APP on 7/24 and will call if need anything sooner.

## 2023-09-06 ENCOUNTER — Encounter: Payer: Self-pay | Admitting: Emergency Medicine

## 2023-09-08 ENCOUNTER — Ambulatory Visit: Admitting: Emergency Medicine

## 2023-09-30 NOTE — Progress Notes (Unsigned)
 Cardiology Office Note:    Date:  10/11/2023   ID:  Gina Bishop, DOB October 23, 1931, MRN [id number]  PCP:  Madelon Donald HERO, DO  Cardiologist:  Soyla DELENA Merck, MD     Referring MD: Madelon Donald HERO, DO   Chief Complaint: follow-up of CHF and atrial fibrillation   History of Present Illness:    Gina Bishop is a 89 y.o. female with a history of coronary artery calcifications noted on CTA in 03/2023, chronic CHF with EF of 20-25% in 03/2023 with mild improved to 30-35% on repeat Echo in 05/2023, persistent atrial fibrillation on Eliquis , aortic stenosis, moderate mitral regurgitation, hypertension, hyperlipidemia, and scoliosis who is followed by Dr. Merck and presents today for routine follow-up.   Patient was first seen by Cardiology during admission in 03/2023 for pneumonia, new onset atrial fibrillation with RVR, and new onset CHF. Rates were initially as high as the 150s. Echo showed LVEF of 20-25% with global hypokinesis, severe RV dysfunction, likely moderate low flow low gradient AS, and moderate to severe MR. SABRA She was started on anticoagulation and rate controlled with Metoprolol . She was also diuresed with IV Lasix . Initial plan was for TEE/ DCCV once she got more euvolemic but patient later reused this and wished to proceed with more conservative management. Hospitalization was complicated by AKI and hyponatremia. Repeat limited Echo in 05/2023 showed LVEF of 30-35% with global hypokinesis and mild LVH of the basal-septal segment, moderate thickening of the aortic valve with mild AI but no AS, moderate MR, and moderate TR.   She was last seen in the office by Lum Louis, NP, in 06/2023 at which time she denied any cardiac symptoms. Benazepril  was switched to Losartan .   Patient presents today for follow-up.  She reports doing well since last visit and states I am not sure why I am here.  She reports some mild dyspnea on exertion if she overexerts herself but this is not new and  is stable.  She denies any shortness of breath at rest or with routine activities around the house.  No orthopnea or PND.  She does have chronic lower extremity swelling but states this is improving.  Weight is stable at home around 153 lbs. No chest pain, palpitations, lightheadedness/dizziness, syncope.  She is tolerating her medications well. She states cough resolved after Benazepril  was stopped and she was switched to Losartan .   Of note, patient states she is taking Lasix  40mg  twice daily (I had that she was only taking this once daily. I also had that she was no longer on Spironolactone  due to soft BP but she states she is taking 25mg  once daily. Will update our med list to reflect this.  EKGs/Labs/Other Studies Reviewed:    The following studies were reviewed:  Complete Echocardiogram 04/01/2023: Impressions 1. Compared with the echo in 2022, systolic function is worse. Left  ventricular ejection fraction, by estimation, is 20 to 25%. The left  ventricle has severely decreased function. The left ventricle demonstrates  global hypokinesis. Left ventricular  diastolic parameters are consistent with Grade II diastolic dysfunction  (pseudonormalization).   2. Right ventricular systolic function is normal. The right ventricular  size is normal. There is moderately elevated pulmonary artery systolic  pressure.   3. Large pleural effusion in the left lateral region.   4. The mitral valve is normal in structure. Mild mitral valve  regurgitation. No evidence of mitral stenosis.   5. Tricuspid valve regurgitation is moderate to severe.  6. The aortic valve is tricuspid. There is moderate calcification of the  aortic valve. There is moderate thickening of the aortic valve. Aortic  valve regurgitation is mild. No aortic stenosis is present.   7. The inferior vena cava is dilated in size with >50% respiratory  variability, suggesting right atrial pressure of 8 mmHg.  _______________  Limited  Echocardiogram 05/30/2023: Impressions: 1. Left ventricular ejection fraction, by estimation, is 30 to 35%. The  left ventricle has moderately decreased function. The left ventricle  demonstrates global hypokinesis. The left ventricular internal cavity size  was moderately dilated. There is mild   left ventricular hypertrophy of the basal-septal segment. Left  ventricular diastolic parameters are indeterminate.   2. Left atrial size was mildly dilated.   3. The mitral valve is degenerative. Moderate mitral valve regurgitation.   4. Tricuspid valve regurgitation is moderate.   5. The aortic valve is calcified. There is moderate calcification of the  aortic valve. There is moderate thickening of the aortic valve. Aortic  valve regurgitation is mild. Aortic valve sclerosis is present, with no  evidence of aortic valve stenosis.  Aortic regurgitation PHT measures 397 msec. Aortic valve mean gradient  measures 8.0 mmHg. Aortic valve Vmax measures 1.88 m/s.   6. There is normal pulmonary artery systolic pressure. The estimated  right ventricular systolic pressure is 23.4 mmHg.   Comparison(s): Prior images reviewed side by side. The left ventricular  function has improved. Prior EF 20-25%.   EKG:  EKG ordered today.  EKG Interpretation Date/Time:  Tuesday October 11 2023 10:11:19 EDT Ventricular Rate:  85 PR Interval:    QRS Duration:  92 QT Interval:  376 QTC Calculation: 447 R Axis:   -4  Text Interpretation: Atrial fibrillation Nonspecific ST and T wave abnormality When compared with ECG of 27-Jun-2023 13:26, No significant changes Confirmed by Jadine Patient [phone]) on 10/11/2023 10:15:40 AM    Recent Labs: 03/31/2023: B Natriuretic Peptide 1,142.0 04/01/2023: TSH 1.233 04/03/2023: ALT 108 04/06/2023: Hemoglobin 12.7; Platelets 191 04/08/2023: Magnesium  2.0 07/14/2023: BUN 31; Creatinine, Ser 1.14; Potassium 4.9; Sodium 136  Recent Lipid Panel    Component Value Date/Time   CHOL  112 04/01/2023 0518   CHOL 194 04/08/2021 1703   TRIG 55 04/01/2023 0518   HDL 48 04/01/2023 0518   HDL 63 04/08/2021 1703   CHOLHDL 2.3 04/01/2023 0518   VLDL 11 04/01/2023 0518   LDLCALC 53 04/01/2023 0518   LDLCALC 110 (H) 04/08/2021 1703    Physical Exam:    Vital Signs: BP 108/68   Pulse 85   Ht 5' 5 (1.651 m)   Wt 155 lb 3.2 oz (70.4 kg)   SpO2 97%   BMI 25.83 kg/m     Wt Readings from Last 3 Encounters:  10/11/23 155 lb 3.2 oz (70.4 kg)  06/27/23 153 lb (69.4 kg)  05/19/23 155 lb (70.3 kg)     General: 89 y.o. Caucasian female in no acute distress. HEENT: Normocephalic and atraumatic. Sclera clear.  Neck: Supple. No JVD. Heart: Irregularly irregular rhythm with normal rate. II/VI systolic murmur noted (loudest at upper sternal border). Lungs: No increased work of breathing. Clear to ausculation bilaterally. No wheezes, rhonchi, or rales.  Extremities: Mild lower extremity edema bilaterally. Skin: Warm and dry. Neuro: No focal deficits. Psych: Normal affect. Responds appropriately.   Assessment:    1. Chronic HFrEF (heart failure with reduced ejection fraction) (HCC)   2. Permanent atrial fibrillation (HCC)   3. Aortic valve  stenosis, etiology of cardiac valve disease unspecified   4. Mitral valve insufficiency, unspecified etiology   5. Tricuspid valve insufficiency, unspecified etiology   6. Primary hypertension   7. Hyperlipidemia, unspecified hyperlipidemia type   8. Stage 3 chronic kidney disease, unspecified whether stage 3a or 3b CKD (HCC)     Plan:    Chronic HFrEF Diagnosed in 03/2023 during admission for new onset atrial fibrillation in setting of pneumonia. Echo at that time showed LVEF of 20-25% with severe RV dysfunction.  Repeat Echo in 05/2023 showed 05/2023 showed LVEF of 30-35% with global hypokinesis and mild LVH of the basal-septal segment.  - She has some mild lower extremity edema but states this is actually improving. No other signs of  volume overload. Weight stable at home.  - Continue Lasix  40mg  twice daily.  - Continue Losartan  50mg  daily. - Continue Toprol -XL 100mg  daily.  - Continue Spironolactone  25mg  daily.  - Discussed adding SGLT2 inhibitor but patient would like to hold off on this given she is doing well. Would recommend starting this if we run into volume issues in the future. - Discussed importance of daily weights and sodium/ fluid restrictions.  - Will repeat BMT today to make sure renal function is stable. - Etiology unclear. Possibly due to atrial fibrillation. However, CTA in 03/2023 also showed extensive coronary artery calcifications so cannot rule out ischemia as cause. Patient has declined any invasive work-up in the past and confirms today that she does not want any additional invasive testing. Will continue to treat medically.   Persistent Atrial Fibrillation Diagnosed in 03/2023. She declined TEE/ DCCV at that time and preferred to manage conservatively. Therefore, focus has been on rate control.  - Rate controlled.  - Continue Toprol -XL 50mg  daily.  - Continue chronic anticoagulation with Eliquis  5mg  twice daily.   Possible Moderate Low Flow Low Gradient Aortic Stenosis Moderate Mitral Regurgitation  Moderate Tricuspid Regurgitation  Echo report in 03/2023 mentioned moderate calcification of aortic valve and mild AI but no AS. However, per Dr. Laurence personal read, felt to likely have moderate low flow low gradient AS. Also noted to have moderate to severe MR. Repeat limited Echo in 05/2023 showed moderate thickening of the aortic valve with mild AI but no AS, moderate MR, and moderate TR.  - Patient has declined any invasive work-up as above so do not think serial imaging is necessary unless patient has worsening symptoms. Continue to treat CHF as above.   Hypertension BP well controlled. - Will treat in context of CHF as above.  Hyperlipidemia Lipid panel 03/2023: Total Cholesterol 112,  Triglycerides 55, HDL 48, LDL 53.  - Continue Pravastatin  40mg  daily.   CKD Stage III Recent baseline creatinine around 1.2 to 1.3. Stable at 1.14 on last labs in 06/2023.   Disposition: Follow up in 6 months.   Bonney Aline FORBES Jadine, PA-C  10/11/2023 12:46 PM    [facility]

## 2023-10-11 ENCOUNTER — Encounter: Payer: Self-pay | Admitting: Student

## 2023-10-11 ENCOUNTER — Ambulatory Visit: Attending: Student | Admitting: Student

## 2023-10-11 VITALS — BP 108/68 | HR 85 | Ht 65.0 in | Wt 155.2 lb

## 2023-10-11 DIAGNOSIS — I350 Nonrheumatic aortic (valve) stenosis: Secondary | ICD-10-CM | POA: Insufficient documentation

## 2023-10-11 DIAGNOSIS — I071 Rheumatic tricuspid insufficiency: Secondary | ICD-10-CM | POA: Diagnosis present

## 2023-10-11 DIAGNOSIS — I5022 Chronic systolic (congestive) heart failure: Secondary | ICD-10-CM | POA: Insufficient documentation

## 2023-10-11 DIAGNOSIS — I340 Nonrheumatic mitral (valve) insufficiency: Secondary | ICD-10-CM | POA: Insufficient documentation

## 2023-10-11 DIAGNOSIS — N183 Chronic kidney disease, stage 3 unspecified: Secondary | ICD-10-CM | POA: Insufficient documentation

## 2023-10-11 DIAGNOSIS — I4821 Permanent atrial fibrillation: Secondary | ICD-10-CM | POA: Insufficient documentation

## 2023-10-11 DIAGNOSIS — I10 Essential (primary) hypertension: Secondary | ICD-10-CM | POA: Insufficient documentation

## 2023-10-11 DIAGNOSIS — E785 Hyperlipidemia, unspecified: Secondary | ICD-10-CM | POA: Diagnosis present

## 2023-10-11 MED ORDER — FUROSEMIDE 40 MG PO TABS: 40.0000 mg | ORAL_TABLET | Freq: Two times a day (BID) | ORAL | Status: AC

## 2023-10-11 MED ORDER — FUROSEMIDE 40 MG PO TABS: 40.0000 mg | ORAL_TABLET | Freq: Two times a day (BID) | ORAL | Status: DC

## 2023-10-11 MED ORDER — SPIRONOLACTONE 25 MG PO TABS: 25.0000 mg | ORAL_TABLET | Freq: Every day | ORAL | Status: AC

## 2023-10-11 NOTE — Patient Instructions (Addendum)
 Medication Instructions:  Your medication list has been updated.  *If you need a refill on your cardiac medications before your next appointment, please call your pharmacy*   Lab Work: Labs will be drawn toady.................. BMET If you have labs (blood work) drawn today and your tests are completely normal, you will receive your results only by: MyChart Message (if you have MyChart) OR A paper copy in the mail If you have any lab test that is abnormal or we need to change your treatment, we will call you to review the results.   Testing/Procedures: No procedures were ordered during today's visit.    Follow-Up: At [facility], you and your health needs are our priority.  As part of our continuing mission to provide you with exceptional heart care, we have created designated Provider Care Teams.  These Care Teams include your primary Cardiologist (physician) and Advanced Practice Providers (APPs -  Physician Assistants and Nurse Practitioners) who all work together to provide you with the care you need, when you need it.  We recommend signing up for the patient portal called MyChart.  Sign up information is provided on this After Visit Summary.  MyChart is used to connect with patients for Virtual Visits (Telemedicine).  Patients are able to view lab/test results, encounter notes, upcoming appointments, etc.  Non-urgent messages can be sent to your provider as well.   To learn more about what you can do with MyChart, go to [email or web address].    Your next appointment:   6 month(s)  Provider:   Callie Goodrich, PA-C          Other Instructions  Heart Failure Education: Weigh yourself EVERY morning after you go to the bathroom but before you eat or drink anything. Write this number down in a weight log/diary. If you gain 3 pounds overnight or 5 pounds in a week, call the office. Take your medicines as prescribed. If you have concerns about your medications, please  call us  before you stop taking them.  Eat low salt foods--Limit salt (sodium) to 2000 mg per day. This will help prevent your body from holding onto fluid. Read food labels as many processed foods have a lot of sodium, especially canned goods and prepackaged meats. If you would like some assistance choosing low sodium foods, we would be happy to set you up with a nutritionist. Limit all fluids for the day to less than 2 liters (64 ounces). Fluid includes all drinks, coffee, juice, ice chips, soup, jello, and all other liquids. Stay as active as you can everyday. Staying active will give you more energy and make your muscles stronger. Start with 5 minutes at a time and work your way up to 30 minutes a day. Break up your activities--do some in the morning and some in the afternoon. Start with 3 days per week and work your way up to 5 days as you can.  If you have chest pain, feel short of breath, dizzy, or lightheaded, STOP. If you don't feel better after a short rest, call 911. If you do feel better, call the office to let us  know you have symptoms with exercise.

## 2023-10-12 ENCOUNTER — Ambulatory Visit: Payer: Self-pay | Admitting: Student

## 2023-10-12 ENCOUNTER — Ambulatory Visit: Admitting: Emergency Medicine

## 2023-10-12 DIAGNOSIS — N183 Chronic kidney disease, stage 3 unspecified: Secondary | ICD-10-CM

## 2023-10-12 DIAGNOSIS — I5022 Chronic systolic (congestive) heart failure: Secondary | ICD-10-CM

## 2023-10-12 LAB — BASIC METABOLIC PANEL WITH GFR
BUN/Creatinine Ratio: 31 — ABNORMAL HIGH (ref 12–28)
BUN: 46 mg/dL — ABNORMAL HIGH (ref 8–27)
CO2: 23 mmol/L (ref 20–29)
Calcium: 9.5 mg/dL (ref 8.7–10.3)
Chloride: 96 mmol/L (ref 96–106)
Creatinine, Ser: 1.47 mg/dL — ABNORMAL HIGH (ref 0.57–1.00)
Glucose: 88 mg/dL (ref 70–99)
Potassium: 5 mmol/L (ref 3.5–5.2)
Sodium: 136 mmol/L (ref 134–144)
eGFR: 34 mL/min/1.73 — ABNORMAL LOW (ref >59)

## 2023-11-14 ENCOUNTER — Other Ambulatory Visit: Payer: Self-pay | Admitting: *Deleted

## 2023-11-14 DIAGNOSIS — N183 Chronic kidney disease, stage 3 unspecified: Secondary | ICD-10-CM

## 2023-11-14 DIAGNOSIS — I5022 Chronic systolic (congestive) heart failure: Secondary | ICD-10-CM

## 2023-11-14 LAB — BASIC METABOLIC PANEL WITH GFR
BUN/Creatinine Ratio: 47 — ABNORMAL HIGH (ref 12–28)
BUN: 73 mg/dL — ABNORMAL HIGH (ref 10–36)
CO2: 22 mmol/L (ref 20–29)
Calcium: 9.2 mg/dL (ref 8.7–10.3)
Chloride: 98 mmol/L (ref 96–106)
Creatinine, Ser: 1.54 mg/dL — ABNORMAL HIGH (ref 0.57–1.00)
Glucose: 167 mg/dL — ABNORMAL HIGH (ref 70–99)
Potassium: 4.8 mmol/L (ref 3.5–5.2)
Sodium: 136 mmol/L (ref 134–144)
eGFR: 32 mL/min/1.73 — ABNORMAL LOW (ref >59)

## 2023-11-17 ENCOUNTER — Ambulatory Visit: Payer: Self-pay | Admitting: Student

## 2023-11-23 NOTE — Progress Notes (Signed)
 Called pt, left vm, na

## 2023-11-28 ENCOUNTER — Other Ambulatory Visit: Payer: Self-pay

## 2023-11-28 DIAGNOSIS — I5022 Chronic systolic (congestive) heart failure: Secondary | ICD-10-CM

## 2023-11-28 NOTE — Telephone Encounter (Signed)
Pt returning call for results. Please advise.  

## 2023-11-28 NOTE — Progress Notes (Signed)
 Spoke with patient regarding lab results She understands how to take her lasix . Lab orders have been placed for 2 wk draw

## 2023-12-02 ENCOUNTER — Other Ambulatory Visit: Payer: Self-pay | Admitting: Nurse Practitioner

## 2023-12-02 DIAGNOSIS — N6341 Unspecified lump in right breast, subareolar: Secondary | ICD-10-CM

## 2023-12-16 ENCOUNTER — Other Ambulatory Visit: Payer: Self-pay | Admitting: Nurse Practitioner

## 2023-12-16 ENCOUNTER — Ambulatory Visit
Admission: RE | Admit: 2023-12-16 | Discharge: 2023-12-16 | Disposition: A | Discharge status: Home / Self Care | Source: Ambulatory Visit | Attending: Nurse Practitioner | Admitting: Nurse Practitioner

## 2023-12-16 DIAGNOSIS — N6341 Unspecified lump in right breast, subareolar: Secondary | ICD-10-CM

## 2023-12-16 HISTORY — PX: BREAST BIOPSY: SHX20

## 2023-12-19 LAB — SURGICAL PATHOLOGY

## 2023-12-23 LAB — LAB REPORT - SCANNED: EGFR: 29.5

## 2023-12-28 ENCOUNTER — Telehealth: Payer: Self-pay

## 2023-12-28 NOTE — Telephone Encounter (Signed)
 Breast Tumor Boards  Gina Bishop was was presented on the Breast tumor board today. She is a candidate for genetic testing based on her family history of colon cancer.  Gina Bishop may meet [employer] ([employer]) criteria for genetic testing for Lynch Syndrome based on her family history of colon cancer in her brother (passed at 13) and son.  [name], MS, CGC  Certified Genetic Counselor  Email: [email or web address]  Phone: [phone]

## 2024-01-02 ENCOUNTER — Telehealth: Payer: Self-pay | Admitting: *Deleted

## 2024-01-02 ENCOUNTER — Encounter: Payer: Self-pay | Admitting: *Deleted

## 2024-01-02 ENCOUNTER — Inpatient Hospital Stay: Attending: Hematology and Oncology | Admitting: Hematology and Oncology

## 2024-01-02 VITALS — BP 133/78 | HR 91 | Temp 98.2°F | Resp 18 | Ht 65.0 in | Wt 154.4 lb

## 2024-01-02 DIAGNOSIS — M810 Age-related osteoporosis without current pathological fracture: Secondary | ICD-10-CM | POA: Diagnosis not present

## 2024-01-02 DIAGNOSIS — Z170 Estrogen receptor positive status [ER+]: Secondary | ICD-10-CM | POA: Diagnosis not present

## 2024-01-02 DIAGNOSIS — Z8051 Family history of malignant neoplasm of kidney: Secondary | ICD-10-CM | POA: Diagnosis not present

## 2024-01-02 DIAGNOSIS — C50111 Malignant neoplasm of central portion of right female breast: Secondary | ICD-10-CM | POA: Insufficient documentation

## 2024-01-02 DIAGNOSIS — Z800 Family history of malignant neoplasm of digestive organs: Secondary | ICD-10-CM | POA: Diagnosis not present

## 2024-01-02 MED ORDER — ANASTROZOLE 1 MG PO TABS: 1.0000 mg | ORAL_TABLET | Freq: Every day | ORAL | 3 refills | Status: AC

## 2024-01-02 NOTE — Progress Notes (Signed)
 [facility] CONSULT NOTE  Patient Care Team: Madelon Donald HERO, DO as PCP - General (Family Medicine) Loni Soyla LABOR, MD as PCP - Cardiology (Cardiology) Lynnette Dunnings, MD (Dental General Practice) Beverlee, [place] GAILS, MD as Referring Physician (Ophthalmology) Gerome Devere HERO, RN as Oncology Nurse Navigator  CHIEF COMPLAINTS/PURPOSE OF CONSULTATION:  Newly diagnosed breast cancer  HISTORY OF PRESENTING ILLNESS:  History of Present Illness Gina Bishop is a 89 year old female with invasive ductal cancer who presents for a consultation regarding her breast cancer diagnosis. She is accompanied by her son, Camellia.  She noticed swelling of the nipple and a lump, leading to a mammogram, ultrasound, and biopsy. The diagnosis is invasive ductal cancer, with a lump size of 1.6 cm and a total affected area of 2.2 cm. The cancer is grade 2, estrogen and progesterone receptor-positive, and HER2-negative. The KI67 proliferation marker is 5%, indicating slow growth.  She is not considering surgery due to her age and is concerned about potential side effects of anti-estrogen therapy, including hot flashes, joint stiffness, and osteoporosis. She already has osteoporosis and a history of falls.  She has a large bruise from the biopsy, which is still present, and is on blood thinners.     I reviewed her records extensively and collaborated the history with the patient.  SUMMARY OF ONCOLOGIC HISTORY: Oncology History  Malignant neoplasm of central portion of right breast in female, estrogen receptor positive (HCC)  01/02/2024 Initial Diagnosis   Malignant neoplasm of central portion of right breast in female, estrogen receptor positive (HCC)   01/02/2024 Cancer Staging   Staging form: Breast, AJCC 8th Edition - Clinical: Stage IB (cT2, cN0, cM0, G2, ER+, PR+, HER2-) - Signed by Odean Potts, MD on 01/02/2024 Stage prefix: Initial diagnosis Histologic grading system: 3 grade  system      MEDICAL HISTORY:  Past Medical History:  Diagnosis Date   Allergy    Arthritis    Blood transfusion    Hyperlipidemia    Hypertension    Hypomagnesemia 04/01/2023   Pleural effusion 04/01/2023   Pneumonia 04/01/2023   Scoliosis     SURGICAL HISTORY: Past Surgical History:  Procedure Laterality Date   ABDOMINAL HYSTERECTOMY     APPENDECTOMY     BREAST BIOPSY Right    Age 89 - benign   BREAST BIOPSY Right 12/16/2023   US  RT BREAST BX W LOC DEV 1ST LESION IMG BX SPEC US  GUIDE 12/16/2023 [facility] MAMMOGRAPHY   CESAREAN SECTION     INTRAMEDULLARY (IM) NAIL INTERTROCHANTERIC Left 05/11/2022   Procedure: INTRAMEDULLARY (IM) NAIL INTERTROCHANTERIC;  Surgeon: Dozier Soulier, MD;  Location: [facility];  Service: Orthopedics;  Laterality: Left;   JOINT REPLACEMENT      SOCIAL HISTORY: Social History   Socioeconomic History   Marital status: Married    Spouse name: John   Number of children: 3   Years of education: 12   Highest education level: Not on file  Occupational History   Occupation: Retired- banker  Tobacco Use   Smoking status: Never   Smokeless tobacco: Never  Vaping Use   Vaping status: Never Used  Substance and Sexual Activity   Alcohol use: No   Drug use: No   Sexual activity: Not Currently  Other Topics Concern   Not on file  Social History Narrative   Health Care POA:    Emergency Contact: husband, Norleen (c) [phone] or son, Camellia (c) [phone]   End of Life Plan: Pt reports  giving living will copy to Dr. Scarlet   Who lives with you: husband and son   Any pets: none   Diet: Pt have a varied diet of protein, starch, fruits and vegetables.   Exercise: Pt exercises at [facility] 5-6 times a week. Pt reports participating in chair yoga, low impact aerobic classes, and swimming.   Seatbelts: Pt reports wearing seatbelt when in vehicles.    [place] Exposure/Protection: Pt reports wearing sunscreen and hat when outside.   Hobbies: reading and  walking at the beach         Current Social History 11/02/2016                                                                                                     Social Drivers of Health   Financial Resource Strain: Low Risk  (05/19/2023)   Overall Financial Resource Strain (CARDIA)    Difficulty of Paying Living Expenses: Not hard at all  Food Insecurity: No Food Insecurity (12/30/2023)   Hunger Vital Sign    Worried About Running Out of Food in the Last Year: Never true    Ran Out of Food in the Last Year: Never true  Transportation Needs: No Transportation Needs (12/30/2023)   PRAPARE - Administrator, Civil Service (Medical): No    Lack of Transportation (Non-Medical): No  Physical Activity: Sufficiently Active (05/19/2023)   Exercise Vital Sign    Days of Exercise per Week: 5 days    Minutes of Exercise per Session: 60 min  Stress: No Stress Concern Present (05/19/2023)   [employer] of Occupational Health - Occupational Stress Questionnaire    Feeling of Stress : Not at all  Social Connections: Moderately Isolated (05/19/2023)   Social Connection and Isolation Panel    Frequency of Communication with Friends and Family: More than three times a week    Frequency of Social Gatherings with Friends and Family: More than three times a week    Attends Religious Services: Never    Database Administrator or Organizations: Yes    Attends Engineer, Structural: More than 4 times per year    Marital Status: Widowed  Intimate Partner Violence: Not At Risk (05/19/2023)   Humiliation, Afraid, Rape, and Kick questionnaire    Fear of Current or Ex-Partner: No    Emotionally Abused: No    Physically Abused: No    Sexually Abused: No    FAMILY HISTORY: Family History  Adopted: Yes  Problem Relation Age of Onset   Kidney disease Mother    Kidney cancer Mother    Heart disease Father    Colon cancer Brother    Diabetes Son        T1DM   Rosacea Son    Colon  cancer Son     ALLERGIES:  is allergic to fish allergy, orange juice [orange oil], strawberry extract, vicodin [hydrocodone-acetaminophen ], erythromycin, amlodipine , fexofenadine , amoxicillin , and hydrochlorothiazide.  MEDICATIONS:  Current Outpatient Medications  Medication Sig Dispense Refill   albuterol (VENTOLIN HFA) 108 (90 Base) MCG/ACT inhaler Inhale 2 puffs  into the lungs every 6 (six) hours as needed for shortness of breath.     apixaban  (ELIQUIS ) 5 MG TABS tablet Take 1 tablet (5 mg total) by mouth 2 (two) times daily. 180 tablet 3   furosemide  (LASIX ) 40 MG tablet Take 1 tablet (40 mg total) by mouth 2 (two) times daily.     gabapentin  (NEURONTIN ) 100 MG capsule Take 1 capsule (100 mg total) by mouth 2 (two) times daily before lunch and supper.     losartan  (COZAAR ) 50 MG tablet Take 1 tablet (50 mg total) by mouth daily. 90 tablet 3   metoprolol  succinate (TOPROL -XL) 100 MG 24 hr tablet Take 1 tablet (100 mg total) by mouth daily. Take with or immediately following a meal. 90 tablet 3   polyethylene glycol (MIRALAX  / GLYCOLAX ) 17 g packet Take 17 g by mouth 2 (two) times daily.     pravastatin  (PRAVACHOL ) 40 MG tablet Take 1 tablet (40 mg total) by mouth at bedtime.     spironolactone  (ALDACTONE ) 25 MG tablet Take 1 tablet (25 mg total) by mouth daily.     azelastine  (ASTELIN ) 0.1 % nasal spray Place 1 spray into both nostrils 2 (two) times daily. Use in each nostril as directed (Patient not taking: Reported on 01/02/2024) 30 mL 3   pramipexole  (MIRAPEX ) 0.5 MG tablet Take 1 tablet (0.5 mg total) by mouth at bedtime. (Patient not taking: Reported on 01/02/2024)     No current facility-administered medications for this visit.    REVIEW OF SYSTEMS:   Constitutional: Denies fevers, chills or abnormal night sweats Breast: Palpable lump right nipple All other systems were reviewed with the patient and are negative.  PHYSICAL EXAMINATION: ECOG PERFORMANCE STATUS: 2 - Symptomatic,  <50% confined to bed  Vitals:   01/02/24 1546  BP: 133/78  Pulse: 91  Resp: 18  Temp: 98.2 F (36.8 C)  SpO2: 100%   Filed Weights   01/02/24 1546  Weight: 154 lb 6.4 oz (70 kg)    GENERAL:alert, no distress and comfortable    LABORATORY DATA:  I have reviewed the data as listed Lab Results  Component Value Date   WBC 15.2 (H) 04/06/2023   HGB 12.7 04/06/2023   HCT 37.3 04/06/2023   MCV 85.7 04/06/2023   PLT 191 04/06/2023   Lab Results  Component Value Date   NA 136 11/14/2023   K 4.8 11/14/2023   CL 98 11/14/2023   CO2 22 11/14/2023    RADIOGRAPHIC STUDIES: I have personally reviewed the radiological reports and agreed with the findings in the report.  ASSESSMENT AND PLAN:  Malignant neoplasm of central portion of right breast in female, estrogen receptor positive (HCC) 12/16/2023: Palpable right nipple lesion and nipple enlargement and discoloration for 1 to 2 months, ultrasound and mammogram right retroareolar breast mass 1.6 cm along with nipple enlargement measured 2.2 cm by ultrasound, axilla negative: Biopsy: Grade 2 IDC with extracellular mucin LVI not identified, ER 99%, PR 99%, Ki67 5%, HER2 0 negative  Pathology and radiology counseling: Discussed with the patient, the details of pathology including the type of breast cancer,the clinical staging, the significance of ER, PR and HER-2/neu receptors and the implications for treatment.  Treatment plan: We discussed the role of surgery versus palliative antiestrogen therapy. Given her age and comorbidities, we felt that we can start off with the antiestrogen therapy.  Recommend starting anastrozole 1 mg daily.  Anastrozole counseling: We discussed the risks and benefits of anti-estrogen therapy with  aromatase inhibitors. These include but not limited to insomnia, hot flashes, mood changes, vaginal dryness, bone density loss, and weight gain. We strongly believe that the benefits far outweigh the risks. Patient  understands these risks and consented to starting treatment.  Treatment duration will depend on her tolerance and her wishes.  Clinical follow-up in 3 months.  Mammogram ultrasound may be done once a year.  Assessment & Plan Invasive ductal carcinoma of right breast, stage 1, ER/PR positive, HER2 negative Stage 1, ER/PR positive, HER2 negative, 2.2 cm, grade 2, low KI67. Slow-growing, hormone receptor-positive. Surgery not preferred. Aim to control/shrink tumor with medication. - Prescribed anastrozole 1 mg daily, evening dose. - Follow-up in 3 months for physical examination. - Monitor for side effects: hot flashes, joint stiffness, osteoporosis. - Educated on signs of skin invasion and surgical intervention necessity.  Osteoporosis Fall risk and previous fractures. - Continue current management without additional vitamin D  supplementation.     All questions were answered. The patient knows to call the clinic with any problems, questions or concerns. I personally spent a total of 30 minutes in the care of the patient today including preparing to see the patient, getting/reviewing separately obtained history, performing a medically appropriate exam/evaluation, counseling and educating, placing orders, referring and communicating with other health care professionals, documenting clinical information in the EHR, independently interpreting results, communicating results, and coordinating care.   Viinay K Kendal Raffo, MD 01/02/24

## 2024-01-02 NOTE — Assessment & Plan Note (Addendum)
 12/16/2023: Palpable right nipple lesion and nipple enlargement and discoloration for 1 to 2 months, ultrasound and mammogram right retroareolar breast mass 1.6 cm along with nipple enlargement measured 2.2 cm by ultrasound, axilla negative: Biopsy: Grade 2 IDC with extracellular mucin LVI not identified, ER 99%, PR 99%, Ki67 5%, HER2 0 negative  Pathology and radiology counseling: Discussed with the patient, the details of pathology including the type of breast cancer,the clinical staging, the significance of ER, PR and HER-2/neu receptors and the implications for treatment.  Treatment plan: We discussed the role of surgery versus palliative antiestrogen therapy. Given her age and comorbidities, we felt that we can start off with the antiestrogen therapy.  Recommend starting anastrozole 1 mg daily.  Anastrozole counseling: We discussed the risks and benefits of anti-estrogen therapy with aromatase inhibitors. These include but not limited to insomnia, hot flashes, mood changes, vaginal dryness, bone density loss, and weight gain. We strongly believe that the benefits far outweigh the risks. Patient understands these risks and consented to starting treatment.  Treatment duration will depend on her tolerance and her wishes.  Repeat mammogram and ultrasound in 6 months

## 2024-01-02 NOTE — Progress Notes (Signed)
 Navigator met patient and son Camellia at their initial med onc appt with Dr. Gudena. Reviewed navigator's role and she has my contact information. Plan to start on AI. Patient is aware to call if has any problems or questions.

## 2024-01-02 NOTE — Telephone Encounter (Signed)
 Duplicate entry

## 2024-01-03 ENCOUNTER — Telehealth: Payer: Self-pay | Admitting: Hematology and Oncology

## 2024-01-03 NOTE — Telephone Encounter (Signed)
 left vm for pt about shceduled appt date and time. Encouraged to call back if need to reschedule.

## 2024-01-08 ENCOUNTER — Encounter: Payer: Self-pay | Admitting: *Deleted

## 2024-04-03 ENCOUNTER — Inpatient Hospital Stay: Attending: Hematology and Oncology | Admitting: Hematology and Oncology

## 2024-04-09 ENCOUNTER — Ambulatory Visit: Admitting: Emergency Medicine

## 2024-05-21 ENCOUNTER — Encounter
